# Patient Record
Sex: Male | Born: 1970 | Race: White | Hispanic: No | Marital: Married | Smoking: Current some day smoker
Health system: Southern US, Community
[De-identification: ages and names within clinical notes are randomized; demographics above are authoritative.]

## PROBLEM LIST (undated history)

## (undated) DIAGNOSIS — F329 Major depressive disorder, single episode, unspecified: Secondary | ICD-10-CM

## (undated) DIAGNOSIS — B009 Herpesviral infection, unspecified: Secondary | ICD-10-CM

## (undated) DIAGNOSIS — F32A Depression, unspecified: Secondary | ICD-10-CM

## (undated) DIAGNOSIS — G4726 Circadian rhythm sleep disorder, shift work type: Secondary | ICD-10-CM

## (undated) DIAGNOSIS — N200 Calculus of kidney: Secondary | ICD-10-CM

## (undated) DIAGNOSIS — I10 Essential (primary) hypertension: Secondary | ICD-10-CM

## (undated) HISTORY — DX: Circadian rhythm sleep disorder, shift work type: G47.26

## (undated) HISTORY — DX: Major depressive disorder, single episode, unspecified: F32.9

## (undated) HISTORY — PX: VASECTOMY: SHX75

## (undated) HISTORY — DX: Essential (primary) hypertension: I10

## (undated) HISTORY — DX: Depression, unspecified: F32.A

## (undated) HISTORY — DX: Herpesviral infection, unspecified: B00.9

---

## 2000-02-23 ENCOUNTER — Emergency Department (HOSPITAL_COMMUNITY): Admission: EM | Admit: 2000-02-23 | Discharge: 2000-02-23 | Payer: Self-pay | Admitting: *Deleted

## 2001-07-14 ENCOUNTER — Emergency Department (HOSPITAL_COMMUNITY): Admission: EM | Admit: 2001-07-14 | Discharge: 2001-07-14 | Payer: Self-pay | Admitting: Emergency Medicine

## 2009-11-21 ENCOUNTER — Ambulatory Visit (HOSPITAL_COMMUNITY): Admission: RE | Admit: 2009-11-21 | Discharge: 2009-11-21 | Payer: Self-pay | Admitting: Internal Medicine

## 2012-01-02 ENCOUNTER — Other Ambulatory Visit: Payer: Self-pay | Admitting: Physician Assistant

## 2012-01-04 ENCOUNTER — Other Ambulatory Visit: Payer: Self-pay | Admitting: Physician Assistant

## 2012-01-04 MED ORDER — ZOLPIDEM TARTRATE ER 12.5 MG PO TBCR: 12.5000 mg | EXTENDED_RELEASE_TABLET | Freq: Every evening | ORAL | Status: DC | PRN

## 2012-01-23 ENCOUNTER — Ambulatory Visit (INDEPENDENT_AMBULATORY_CARE_PROVIDER_SITE_OTHER): Payer: 59 | Admitting: Family Medicine

## 2012-01-23 VITALS — BP 143/91 | HR 58 | Temp 98.4°F | Resp 16 | Ht 74.5 in | Wt 249.0 lb

## 2012-01-23 DIAGNOSIS — L723 Sebaceous cyst: Secondary | ICD-10-CM

## 2012-01-23 DIAGNOSIS — Z23 Encounter for immunization: Secondary | ICD-10-CM

## 2012-01-23 NOTE — Progress Notes (Signed)
  Patient Name: Ethan Knox Date of Birth: 1971-06-08 Medical Record Number: [id number] Gender: male Date of Encounter: 01/23/2012  History of Present Illness:  Ethan Knox is a 41 y.o. very pleasant male patient who presents with the following:  He had noted a "lump" on his left lower leg for about 2 years but it was never painful.  However over the last month or so it has gotten larger and then became tender.  It has started to be more tender if something presses on it or if he uses the muscle to ride his bike.  He is generally otherwise healthy.   He is unsure of his last tetanus shot  There is no problem list on file for this patient.  No past medical history on file. No past surgical history on file. History  Substance Use Topics  . Smoking status: Passive Smoker    Types: Cigars  . Smokeless tobacco: Not on file  . Alcohol Use: Not on file   No family history on file. Allergies not on file  Medication list has been reviewed and updated.  Review of Systems: As per HPI- otherwise negative.  Physical Examination: Filed Vitals:   01/23/12 1455  BP: 143/91  Pulse: 58  Temp: 98.4 F (36.9 C)  TempSrc: Oral  Resp: 16  Height: 6' 2.5" (1.892 m)  Weight: 249 lb (112.946 kg)    Body mass index is 31.54 kg/(m^2).   GEN: WDWN, NAD, Non-toxic, Alert & Oriented x 3 HEENT: Atraumatic, Normocephalic.  Ears and Nose: No external deformity. EXTR: No clubbing/cyanosis/edema NEURO: Normal gait.  PSYCH: Normally interactive. Conversant. Not depressed or anxious appearing.  Calm demeanor.  There is a small sore area that seems consistent with a sebaceous cyst on his left posterior/ lateral calf above his ankle  VC obtained.  Area carefully cleansed with betadine and alcohol. anest with about 2.5cc of 1% lidocaine with epi.  I and D into cyst- expressed sebaceous material.  Packed with a small amount of 1/4" packing and dressed  Assessment and Plan: 1. Sebaceous  cyst    2. Need for diphtheria-tetanus-pertussis (Tdap) vaccine, adult/adolescent  Tdap vaccine greater than or equal to 7yo IM   Cyst s/p I and D as above.  Due to small size I do not feel that he definitely has to come back for a packing change- he may be able to just remove the packing himself in 48 hours.  He will use his judgement and come back in if the area seems to be draining or if he notes any other concerns.   Updated tdap today.

## 2012-01-25 ENCOUNTER — Ambulatory Visit (INDEPENDENT_AMBULATORY_CARE_PROVIDER_SITE_OTHER): Payer: 59 | Admitting: Physician Assistant

## 2012-01-25 VITALS — BP 134/74 | HR 68 | Temp 99.5°F | Resp 18 | Ht 74.0 in

## 2012-01-25 DIAGNOSIS — L0291 Cutaneous abscess, unspecified: Secondary | ICD-10-CM

## 2012-01-25 DIAGNOSIS — L0390 Cellulitis, unspecified: Secondary | ICD-10-CM

## 2012-01-25 NOTE — Progress Notes (Signed)
  Subjective:    Patient ID: Ethan Knox, male    DOB: 1971/03/21, 41 y.o.   MRN: [id number]  HPI See previous  Ethan Knox is here for return visit s/p I & D. Has had a lot of drainage and pain.  Some swelling. No fever or chills.   Review of Systems As above     Objective:   Physical Exam Left posterior calf with incision.  Erythema surrounding incision.  Copious sebaceous material and purulent drainage expressed.  Irrigated with NACL and lidocaine.  Repacked.  Area of erythema marked. Dressed.       Assessment & Plan:  Cellulitis Leg  Start doxycycline. Warm compress.  Watch red area. Elevate when possible. Advance packing 1/4" every 48 hrs.  Return Thursday as patient will be out of town,

## 2012-01-26 MED ORDER — DOXYCYCLINE HYCLATE 100 MG PO TABS: 100.0000 mg | ORAL_TABLET | Freq: Two times a day (BID) | ORAL | Status: AC

## 2012-01-30 ENCOUNTER — Ambulatory Visit (INDEPENDENT_AMBULATORY_CARE_PROVIDER_SITE_OTHER): Payer: 59 | Admitting: Family Medicine

## 2012-01-30 VITALS — BP 159/75 | HR 71 | Temp 98.3°F | Resp 16 | Ht 75.25 in | Wt 249.2 lb

## 2012-01-30 DIAGNOSIS — Z5189 Encounter for other specified aftercare: Secondary | ICD-10-CM

## 2012-01-30 NOTE — Progress Notes (Signed)
  Patient Name: Ethan Knox Date of Birth: April 29, 1971 Medical Record Number: [id number] Gender: male Date of Encounter: 01/30/2012  History of Present Illness:  Ethan Knox is a 41 y.o. very pleasant male patient who presents with the following:  Here for wound check for sebaceous cyst on left calf s/p I and D- he started on Doxycycline at his last visit and is feeling better.  Area is less tender and he is able to be more active again.   There is no problem list on file for this patient.  No past medical history on file. No past surgical history on file. History  Substance Use Topics  . Smoking status: Passive Smoker    Types: Cigars  . Smokeless tobacco: Not on file  . Alcohol Use: Not on file   No family history on file. Allergies  Allergen Reactions  . Prilosec (Omeprazole Magnesium) Rash    Medication list has been reviewed and updated.  Review of Systems: As per HPI- otherwise negative.   Physical Examination: Filed Vitals:   01/30/12 1540  BP: 159/75  Pulse: 71  Temp: 98.3 F (36.8 C)  TempSrc: Oral  Resp: 16  Height: 6' 3.25" (1.911 m)  Weight: 249 lb 3.2 oz (113.036 kg)    Body mass index is 30.94 kg/(m^2).   GEN: WDWN, NAD, Non-toxic, Alert & Oriented x 3 HEENT: Atraumatic, Normocephalic.  Ears and Nose: No external deformity. EXTR: No clubbing/cyanosis/edema NEURO: Normal gait.  PSYCH: Normally interactive. Conversant. Not depressed or anxious appearing.  Calm demeanor.  Left calf- small incision.  Removed packing- trace discharge.  Replaced a small piece of 1/4 packing and dressed  Assessment and Plan: Wound care- doing very well.  He can plan to remove the packing himself in 1 to 2 days and keep it covered until healed.  Should not need to return unless other problems or more drainage.

## 2012-04-10 ENCOUNTER — Telehealth: Payer: Self-pay

## 2012-04-10 NOTE — Telephone Encounter (Signed)
Patient needs several refills: rOPINIRole (REQUIP) 0.25 MG tablet valACYclovir (VALTREX) 1000 MG tablet zolpidem (AMBIEN CR) 12.5 MG CR tablet

## 2012-04-12 MED ORDER — VALACYCLOVIR HCL 1 G PO TABS: 1.0000 g | ORAL_TABLET | Freq: Two times a day (BID) | ORAL | Status: DC

## 2012-04-12 MED ORDER — ROPINIROLE HCL 0.25 MG PO TABS: 1.0000 mg | ORAL_TABLET | Freq: Three times a day (TID) | ORAL | Status: DC

## 2012-04-12 MED ORDER — ZOLPIDEM TARTRATE ER 12.5 MG PO TBCR: 12.5000 mg | EXTENDED_RELEASE_TABLET | Freq: Every evening | ORAL | Status: DC | PRN

## 2012-04-12 NOTE — Telephone Encounter (Signed)
Done

## 2012-04-12 NOTE — Telephone Encounter (Signed)
PT NOTIFIED  

## 2012-04-13 ENCOUNTER — Telehealth: Payer: Self-pay

## 2012-04-13 MED ORDER — VALACYCLOVIR HCL 1 G PO TABS: 1000.0000 mg | ORAL_TABLET | Freq: Every day | ORAL | Status: DC

## 2012-04-13 MED ORDER — ROPINIROLE HCL 1 MG PO TABS: 1.0000 mg | ORAL_TABLET | Freq: Every day | ORAL | Status: DC

## 2012-04-13 NOTE — Telephone Encounter (Signed)
Pharmacy called and stated that two of the RFs we sent on 6/2 are incorrect. The Valtrex should be for 100 mg QD, and the Requip should be for 1 mg (total) QD. Both had been sent in for more times per day than needed. Pharmacy and pt both verified correct sig. Marylene Land approved change and sending in new/correct RFs to [facility]

## 2012-06-06 ENCOUNTER — Other Ambulatory Visit: Payer: Self-pay | Admitting: *Deleted

## 2012-06-06 MED ORDER — ZOLPIDEM TARTRATE ER 12.5 MG PO TBCR: 12.5000 mg | EXTENDED_RELEASE_TABLET | Freq: Every evening | ORAL | Status: DC | PRN

## 2012-09-02 ENCOUNTER — Ambulatory Visit (INDEPENDENT_AMBULATORY_CARE_PROVIDER_SITE_OTHER): Payer: 59 | Admitting: Physician Assistant

## 2012-09-02 ENCOUNTER — Encounter: Payer: Self-pay | Admitting: Physician Assistant

## 2012-09-02 VITALS — BP 120/76 | HR 56 | Temp 98.3°F | Resp 16 | Ht 75.0 in | Wt 241.2 lb

## 2012-09-02 DIAGNOSIS — G4726 Circadian rhythm sleep disorder, shift work type: Secondary | ICD-10-CM

## 2012-09-02 DIAGNOSIS — B009 Herpesviral infection, unspecified: Secondary | ICD-10-CM | POA: Insufficient documentation

## 2012-09-02 MED ORDER — ZOLPIDEM TARTRATE ER 12.5 MG PO TBCR: 12.5000 mg | EXTENDED_RELEASE_TABLET | Freq: Every evening | ORAL | Status: DC | PRN

## 2012-09-02 MED ORDER — VALACYCLOVIR HCL 1 G PO TABS: 1000.0000 mg | ORAL_TABLET | Freq: Every day | ORAL | Status: AC

## 2012-09-02 NOTE — Progress Notes (Signed)
  Subjective:    Patient ID: Ethan Knox, male    DOB: 23-Jan-1971, 41 y.o.   MRN: [id number]  HPI This 41 y.o. male presents for Medication refills.  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  Both of these regimens are working well. He notes that he would like to have his cholesterol checked, and that he hasn't had a physical since the [employer]. He is not fasting today, so we elect not to check today.  Review of Systems No chest pain, SOB, HA, dizziness, vision change, N/V, diarrhea, constipation, dysuria, urinary urgency or frequency, myalgias, arthralgias or rash.   Past Medical History  Diagnosis Date  . HSV-2 (herpes simplex virus 2) infection   . Sleep disorder, shift work     Past Surgical History  Procedure Date  . Vasectomy     Prior to Admission medications   Medication Sig Start Date End Date Taking? Authorizing Provider  valACYclovir (VALTREX) 1000 MG tablet Take 1 tablet (1,000 mg total) by mouth daily. 04/13/12 04/13/13 Yes Marzella Schlein McClung, PA-C  zolpidem (AMBIEN CR) 12.5 MG CR tablet Take 1 tablet (12.5 mg total) by mouth at bedtime as needed for sleep. 06/06/12 07/06/12  Nelva Nay, PA-C    Allergies  Allergen Reactions  . Prilosec (Omeprazole Magnesium) Rash    History   Social History  . Marital Status: Married    Spouse Name: Clinton Gallant    Number of Children: 1 son  . Years of Education: 14   Occupational History  . [employer]   Social History Main Topics  . Smoking status: Passive Smoke Exposure - Never Smoker    Types: Cigars   Other Topics Concern  . Not on file   Social History Narrative  . Lives with wife.  6 year old son lives in [place].    Family History  Problem Relation Age of Onset  . Diabetes Father   . Arthritis Father     gout  . Mental retardation Sister        Objective:   Physical Exam VS noted.  A&O x 3.  [place]/AT. Conjunctiva are clear.   Normal respiratory effort.  Skin is warm and dry.  Good mood, appropriate affect, appropriate behavior.     Assessment & Plan:   1. HSV-2 (herpes simplex virus 2) infection  valACYclovir (VALTREX) 1000 MG tablet  2. Sleep disorder, shift work  zolpidem (AMBIEN CR) 12.5 MG CR tablet   RTC 6 months, sooner if needed.  OK to RF Ambien CR through 02/2013.  CPE and fasting labs then or once he turns 42 years.

## 2012-10-06 ENCOUNTER — Telehealth: Payer: Self-pay | Admitting: Physician Assistant

## 2012-10-06 DIAGNOSIS — G4726 Circadian rhythm sleep disorder, shift work type: Secondary | ICD-10-CM

## 2012-10-06 NOTE — Telephone Encounter (Signed)
Pharmacy requesting RF on zolpidem 12.5mg , last disp 09/02/12

## 2012-10-07 MED ORDER — ZOLPIDEM TARTRATE ER 12.5 MG PO TBCR: 12.5000 mg | EXTENDED_RELEASE_TABLET | Freq: Every evening | ORAL | Status: DC | PRN

## 2012-10-07 NOTE — Telephone Encounter (Signed)
Rx printed

## 2012-10-07 NOTE — Telephone Encounter (Signed)
Faxed Rx to pharmacy  

## 2012-11-13 ENCOUNTER — Telehealth: Payer: Self-pay | Admitting: *Deleted

## 2012-11-13 ENCOUNTER — Other Ambulatory Visit: Payer: Self-pay | Admitting: *Deleted

## 2012-11-13 DIAGNOSIS — G4726 Circadian rhythm sleep disorder, shift work type: Secondary | ICD-10-CM

## 2012-11-13 NOTE — Telephone Encounter (Signed)
Pharmacy requesting refill on

## 2013-01-22 ENCOUNTER — Telehealth: Payer: Self-pay

## 2013-01-22 DIAGNOSIS — G4726 Circadian rhythm sleep disorder, shift work type: Secondary | ICD-10-CM

## 2013-01-22 MED ORDER — ZOLPIDEM TARTRATE ER 12.5 MG PO TBCR: 12.5000 mg | EXTENDED_RELEASE_TABLET | Freq: Every evening | ORAL | Status: DC | PRN

## 2013-01-22 NOTE — Telephone Encounter (Signed)
Refill Request on zolpidem (AMBIEN CR) 12.5 MG CR tablet Pharmacy Sent several requests   [phone]

## 2013-01-22 NOTE — Telephone Encounter (Signed)
Pt notified and rx sent to pharmacy 

## 2013-01-22 NOTE — Telephone Encounter (Signed)
rx sent. Meds ordered this encounter  Medications  . zolpidem (AMBIEN CR) 12.5 MG CR tablet    Sig: Take 1 tablet (12.5 mg total) by mouth at bedtime as needed for sleep.    Dispense:  30 tablet    Refill:  0    Order Specific Question:  Supervising Provider    Answer:  DOOLITTLE, ROBERT P [[id number]]

## 2013-01-22 NOTE — Telephone Encounter (Signed)
Please advise on refill request. Pended Rx

## 2013-06-24 ENCOUNTER — Ambulatory Visit (INDEPENDENT_AMBULATORY_CARE_PROVIDER_SITE_OTHER): Payer: 59 | Admitting: Internal Medicine

## 2013-06-24 VITALS — BP 120/80 | HR 55 | Temp 98.7°F | Resp 18 | Ht 73.75 in | Wt 220.6 lb

## 2013-06-24 DIAGNOSIS — G4700 Insomnia, unspecified: Secondary | ICD-10-CM

## 2013-06-24 DIAGNOSIS — H9190 Unspecified hearing loss, unspecified ear: Secondary | ICD-10-CM

## 2013-06-24 DIAGNOSIS — Z0000 Encounter for general adult medical examination without abnormal findings: Secondary | ICD-10-CM

## 2013-06-24 LAB — POCT CBC
Granulocyte percent: 49.2 %G (ref 37–80)
HCT, POC: 48.3 % (ref 43.5–53.7)
MCV: 99.8 fL — AB (ref 80–97)
POC Granulocyte: 2.9 (ref 2–6.9)
RBC: 4.84 M/uL (ref 4.69–6.13)
RDW, POC: 12.6 %

## 2013-06-24 LAB — IFOBT (OCCULT BLOOD): IFOBT: NEGATIVE

## 2013-06-24 LAB — POCT URINALYSIS DIPSTICK
Bilirubin, UA: NEGATIVE
Ketones, UA: NEGATIVE
Leukocytes, UA: NEGATIVE
Nitrite, UA: NEGATIVE
Protein, UA: NEGATIVE
pH, UA: 5

## 2013-06-24 LAB — POCT UA - MICROSCOPIC ONLY
Casts, Ur, LPF, POC: NEGATIVE
Mucus, UA: NEGATIVE
Yeast, UA: NEGATIVE

## 2013-06-24 MED ORDER — ZOLPIDEM TARTRATE 10 MG PO TABS: 10.0000 mg | ORAL_TABLET | Freq: Every evening | ORAL | Status: DC | PRN

## 2013-06-24 NOTE — Patient Instructions (Addendum)
Insomnia Insomnia is frequent trouble falling and/or staying asleep. Insomnia can be a long term problem or a short term problem. Both are common. Insomnia can be a short term problem when the wakefulness is related to a certain stress or worry. Long term insomnia is often related to ongoing stress during waking hours and/or poor sleeping habits. Overtime, sleep deprivation itself can make the problem worse. Every little thing feels more severe because you are overtired and your ability to cope is decreased. CAUSES   Stress, anxiety, and depression.  Poor sleeping habits.  Distractions such as TV in the bedroom.  Naps close to bedtime.  Engaging in emotionally charged conversations before bed.  Technical reading before sleep.  Alcohol and other sedatives. They may make the problem worse. They can hurt normal sleep patterns and normal dream activity.  Stimulants such as caffeine for several hours prior to bedtime.  Pain syndromes and shortness of breath can cause insomnia.  Exercise late at night.  Changing time zones may cause sleeping problems (jet lag). It is sometimes helpful to have someone observe your sleeping patterns. They should look for periods of not breathing during the night (sleep apnea). They should also look to see how long those periods last. If you live alone or observers are uncertain, you can also be observed at a sleep clinic where your sleep patterns will be professionally monitored. Sleep apnea requires a checkup and treatment. Give your caregivers your medical history. Give your caregivers observations your family has made about your sleep.  SYMPTOMS   Not feeling rested in the morning.  Anxiety and restlessness at bedtime.  Difficulty falling and staying asleep. TREATMENT   Your caregiver may prescribe treatment for an underlying medical disorders. Your caregiver can give advice or help if you are using alcohol or other drugs for self-medication. Treatment  of underlying problems will usually eliminate insomnia problems.  Medications can be prescribed for short time use. They are generally not recommended for lengthy use.  Over-the-counter sleep medicines are not recommended for lengthy use. They can be habit forming.  You can promote easier sleeping by making lifestyle changes such as:  Using relaxation techniques that help with breathing and reduce muscle tension.  Exercising earlier in the day.  Changing your diet and the time of your last meal. No night time snacks.  Establish a regular time to go to bed.  Counseling can help with stressful problems and worry.  Soothing music and white noise may be helpful if there are background noises you cannot remove.  Stop tedious detailed work at least one hour before bedtime. HOME CARE INSTRUCTIONS   Keep a diary. Inform your caregiver about your progress. This includes any medication side effects. See your caregiver regularly. Take note of:  Times when you are asleep.  Times when you are awake during the night.  The quality of your sleep.  How you feel the next day. This information will help your caregiver care for you.  Get out of bed if you are still awake after 15 minutes. Read or do some quiet activity. Keep the lights down. Wait until you feel sleepy and go back to bed.  Keep regular sleeping and waking hours. Avoid naps.  Exercise regularly.  Avoid distractions at bedtime. Distractions include watching television or engaging in any intense or detailed activity like attempting to balance the household checkbook.  Develop a bedtime ritual. Keep a familiar routine of bathing, brushing your teeth, climbing into bed at the same   time each night, listening to soothing music. Routines increase the success of falling to sleep faster.  Use relaxation techniques. This can be using breathing and muscle tension release routines. It can also include visualizing peaceful scenes. You can  also help control troubling or intruding thoughts by keeping your mind occupied with boring or repetitive thoughts like the old concept of counting sheep. You can make it more creative like imagining planting one beautiful flower after another in your backyard garden.  During your day, work to eliminate stress. When this is not possible use some of the previous suggestions to help reduce the anxiety that accompanies stressful situations. MAKE SURE YOU:   Understand these instructions.  Will watch your condition.  Will get help right away if you are not doing well or get worse. Document Released: 10/25/2000 Document Revised: 01/20/2012 Document Reviewed: 11/25/2007 [facility] Patient Information 2014 [place], [place]. DASH Diet The DASH diet stands for "Dietary Approaches to Stop Hypertension." It is a healthy eating plan that has been shown to reduce high blood pressure (hypertension) in as little as 14 days, while also possibly providing other significant health benefits. These other health benefits include reducing the risk of breast cancer after menopause and reducing the risk of type 2 diabetes, heart disease, colon cancer, and stroke. Health benefits also include weight loss and slowing kidney failure in patients with chronic kidney disease.  DIET GUIDELINES  Limit salt (sodium). Your diet should contain less than 1500 mg of sodium daily.  Limit refined or processed carbohydrates. Your diet should include mostly whole grains. Desserts and added sugars should be used sparingly.  Include small amounts of heart-healthy fats. These types of fats include nuts, oils, and tub margarine. Limit saturated and trans fats. These fats have been shown to be harmful in the body. CHOOSING FOODS  The following food groups are based on a 2000 calorie diet. See your Registered Dietitian for individual calorie needs. Grains and Grain Products (6 to 8 servings daily)  Eat More Often: Whole-wheat bread, brown  rice, whole-grain or wheat pasta, quinoa, popcorn without added fat or salt (air popped).  Eat Less Often: White bread, white pasta, white rice, cornbread. Vegetables (4 to 5 servings daily)  Eat More Often: Fresh, frozen, and canned vegetables. Vegetables may be raw, steamed, roasted, or grilled with a minimal amount of fat.  Eat Less Often/Avoid: Creamed or fried vegetables. Vegetables in a cheese sauce. Fruit (4 to 5 servings daily)  Eat More Often: All fresh, canned (in natural juice), or frozen fruits. Dried fruits without added sugar. One hundred percent fruit juice ( cup [237 mL] daily).  Eat Less Often: Dried fruits with added sugar. Canned fruit in light or heavy syrup. [employer], Fish, and Poultry (2 servings or less daily. One serving is 3 to 4 oz [85-114 g]).  Eat More Often: Ninety percent or leaner ground beef, tenderloin, sirloin. Round cuts of beef, chicken breast, [place] breast. All fish. Grill, bake, or broil your meat. Nothing should be fried.  Eat Less Often/Avoid: Fatty cuts of meat, [place], or chicken leg, thigh, or wing. Fried cuts of meat or fish. Dairy (2 to 3 servings)  Eat More Often: Low-fat or fat-free milk, low-fat plain or light yogurt, reduced-fat or part-skim cheese.  Eat Less Often/Avoid: Milk (whole, 2%).Whole milk yogurt. Full-fat cheeses. Nuts, Seeds, and Legumes (4 to 5 servings per week)  Eat More Often: All without added salt.  Eat Less Often/Avoid: Salted nuts and seeds, canned beans with  added salt. Fats and Sweets (limited)  Eat More Often: Vegetable oils, tub margarines without trans fats, sugar-free gelatin. Mayonnaise and salad dressings.  Eat Less Often/Avoid: Coconut oils, palm oils, butter, stick margarine, cream, half and half, cookies, candy, pie. FOR MORE INFORMATION The Dash Diet Eating Plan: www.dashdiet.org Document Released: 10/17/2011 Document Revised: 01/20/2012 Document Reviewed: 10/17/2011 [facility] Patient Information  2014 [place], [place]. Attention Deficit Hyperactivity Disorder Attention deficit hyperactivity disorder (ADHD) is a problem with behavior issues based on the way the brain functions (neurobehavioral disorder). It is a common reason for behavior and academic problems in school. CAUSES  The cause of ADHD is unknown in most cases. It may run in families. It sometimes can be associated with learning disabilities and other behavioral problems. SYMPTOMS  There are 3 types of ADHD. The 3 types and some of the symptoms include:  Inattentive  Gets bored or distracted easily.  Loses or forgets things. Forgets to hand in homework.  Has trouble organizing or completing tasks.  Difficulty staying on task.  An inability to organize daily tasks and school work.  Leaving projects, chores, or homework unfinished.  Trouble paying attention or responding to details. Careless mistakes.  Difficulty following directions. Often seems like is not listening.  Dislikes activities that require sustained attention (like chores or homework).  Hyperactive-impulsive  Feels like it is impossible to sit still or stay in a seat. Fidgeting with hands and feet.  Trouble waiting turn.  Talking too much or out of turn. Interruptive.  Speaks or acts impulsively.  Aggressive, disruptive behavior.  Constantly busy or on the go, noisy.  Combined  Has symptoms of both of the above. Often children with ADHD feel discouraged about themselves and with school. They often perform well below their abilities in school. These symptoms can cause problems in home, school, and in relationships with peers. As children get older, the excess motor activities can calm down, but the problems with paying attention and staying organized persist. Most children do not outgrow ADHD but with good treatment can learn to cope with the symptoms. DIAGNOSIS  When ADHD is suspected, the diagnosis should be made by professionals trained in  ADHD.  Diagnosis will include:  Ruling out other reasons for the child's behavior.  The caregivers will check with the child's school and check their medical records.  They will talk to teachers and parents.  Behavior rating scales for the child will be filled out by those dealing with the child on a daily basis. A diagnosis is made only after all information has been considered. TREATMENT  Treatment usually includes behavioral treatment often along with medicines. It may include stimulant medicines. The stimulant medicines decrease impulsivity and hyperactivity and increase attention. Other medicines used include antidepressants and certain blood pressure medicines. Most experts agree that treatment for ADHD should address all aspects of the child's functioning. Treatment should not be limited to the use of medicines alone. Treatment should include structured classroom management. The parents must receive education to address rewarding good behavior, discipline, and limit-setting. Tutoring or behavioral therapy or both should be available for the child. If untreated, the disorder can have long-term serious effects into adolescence and adulthood. HOME CARE INSTRUCTIONS   Often with ADHD there is a lot of frustration among the family in dealing with the illness. There is often blame and anger that is not warranted. This is a life long illness. There is no way to prevent ADHD. In many cases, because the problem affects the  family as a whole, the entire family may need help. A therapist can help the family find better ways to handle the disruptive behaviors and promote change. If the child is young, most of the therapist's work is with the parents. Parents will learn techniques for coping with and improving their child's behavior. Sometimes only the child with the ADHD needs counseling. Your caregivers can help you make these decisions.  Children with ADHD may need help in organizing. Some helpful  tips include:  Keep routines the same every day from wake-up time to bedtime. Schedule everything. This includes homework and playtime. This should include outdoor and indoor recreation. Keep the schedule on the refrigerator or a bulletin board where it is frequently seen. Mark schedule changes as far in advance as possible.  Have a place for everything and keep everything in its place. This includes clothing, backpacks, and school supplies.  Encourage writing down assignments and bringing home needed books.  Offer your child a well-balanced diet. Breakfast is especially important for school performance. Children should avoid drinks with caffeine including:  Soft drinks.  Coffee.  Tea.  However, some older children (adolescents) may find these drinks helpful in improving their attention.  Children with ADHD need consistent rules that they can understand and follow. If rules are followed, give small rewards. Children with ADHD often receive, and expect, criticism. Look for good behavior and praise it. Set realistic goals. Give clear instructions. Look for activities that can foster success and self-esteem. Make time for pleasant activities with your child. Give lots of affection.  Parents are their children's greatest advocates. Learn as much as possible about ADHD. This helps you become a stronger and better advocate for your child. It also helps you educate your child's teachers and instructors if they feel inadequate in these areas. Parent support groups are often helpful. A national group with local chapters is called CHADD (Children and Adults with Attention Deficit Hyperactivity Disorder). PROGNOSIS  There is no cure for ADHD. Children with the disorder seldom outgrow it. Many find adaptive ways to accommodate the ADHD as they mature. SEEK MEDICAL CARE IF:  Your child has repeated muscle twitches, cough or speech outbursts.  Your child has sleep problems.  Your child has a marked  loss of appetite.  Your child develops depression.  Your child has new or worsening behavioral problems.  Your child develops dizziness.  Your child has a racing heart.  Your child has stomach pains.  Your child develops headaches. Document Released: 10/18/2002 Document Revised: 01/20/2012 Document Reviewed: 05/30/2008 [facility] Patient Information 2014 [place], [place].

## 2013-06-24 NOTE — Progress Notes (Signed)
  Subjective:    Patient ID: Ethan Knox, male    DOB: 1971-10-23, 42 y.o.   MRN: [id number]  HPI 42 y.o. Male presents to clinic today for complete physical . Patient has been noticing difficulty with following along with conversations and memory. Was advised to have referral to audiologist by his psychologist. Notices himself being unable to think or things when he knows what they are. Having trouble tracking conversations. Was taking zolpidem at one time to help with sleep.  Is currently seeing a marriage Veterinary surgeon.   Family history of diabetes and emphysema. Patient unaware of any history of cancer in the family.   Review of Systems  Constitutional: Negative.   HENT: Negative.   Eyes: Negative.   Respiratory: Negative.   Cardiovascular: Negative.   Gastrointestinal: Negative.   Endocrine: Negative.   Genitourinary: Negative.   Musculoskeletal: Negative.   Allergic/Immunologic: Negative.   Neurological: Positive for speech difficulty.  Hematological: Negative.   Psychiatric/Behavioral: Positive for sleep disturbance and decreased concentration.       Objective:   Physical Exam  Constitutional: He is oriented to person, place, and time. He appears well-developed and well-nourished.  HENT:  Right Ear: External ear normal.  Left Ear: External ear normal.  Nose: Nose normal.  Mouth/Throat: Oropharynx is clear and moist.  Eyes: Conjunctivae and EOM are normal. Pupils are equal, round, and reactive to light.  Neck: Normal range of motion. Neck supple. No tracheal deviation present. No thyromegaly present.  Cardiovascular: Normal rate, regular rhythm, normal heart sounds and intact distal pulses.   Pulmonary/Chest: Effort normal and breath sounds normal.  Abdominal: Soft. He exhibits no mass. There is no tenderness.  Genitourinary: Rectum normal, prostate normal and penis normal.  Musculoskeletal: Normal range of motion.  Lymphadenopathy:    He has no cervical  adenopathy.  Neurological: He is alert and oriented to person, place, and time. He has normal reflexes. No cranial nerve deficit. He exhibits normal muscle tone. Coordination normal.  Skin: Skin is warm and dry. No rash noted.  Psychiatric: He has a normal mood and affect. His behavior is normal. Judgment and thought content normal. His speech is delayed. Cognition and memory are normal. Cognition and memory are not impaired. He exhibits normal recent memory and normal remote memory.   EKG normal       Assessment & Plan:  Speech/discrimination issues/refer audiology Needs ADD eval Insomnia

## 2013-06-24 NOTE — Progress Notes (Signed)
  Subjective:    Patient ID: Ethan Knox, male    DOB: 1971-03-07, 42 y.o.   MRN: [id number]  HPI In counseling with marriage counselor, she rec audiology eval for hearing/discrimination He alsohas problems blocking when dealing with social interaction. He works shifts and has sleep issues.   Review of Systems     Objective:   Physical Exam        Assessment & Plan:

## 2013-06-25 ENCOUNTER — Encounter: Payer: Self-pay | Admitting: Family Medicine

## 2013-06-25 LAB — TSH: TSH: 1.809 u[IU]/mL (ref 0.350–4.500)

## 2013-06-25 LAB — COMPREHENSIVE METABOLIC PANEL
ALT: 30 U/L (ref 0–53)
AST: 19 U/L (ref 0–37)
Albumin: 4.7 g/dL (ref 3.5–5.2)
CO2: 24 mEq/L (ref 19–32)
Calcium: 9.7 mg/dL (ref 8.4–10.5)
Chloride: 101 mEq/L (ref 96–112)
Creat: 0.86 mg/dL (ref 0.50–1.35)
Potassium: 4.1 mEq/L (ref 3.5–5.3)

## 2013-06-25 LAB — PSA: PSA: 0.94 ng/mL (ref <=4.00)

## 2013-06-25 LAB — LIPID PANEL: Cholesterol: 196 mg/dL (ref 0–200)

## 2013-09-11 ENCOUNTER — Other Ambulatory Visit: Payer: Self-pay | Admitting: Physician Assistant

## 2013-09-16 ENCOUNTER — Other Ambulatory Visit: Payer: Self-pay

## 2013-11-16 ENCOUNTER — Ambulatory Visit (INDEPENDENT_AMBULATORY_CARE_PROVIDER_SITE_OTHER): Payer: 59 | Admitting: Family Medicine

## 2013-11-16 VITALS — BP 136/78 | HR 66 | Temp 98.5°F | Resp 18 | Wt 226.0 lb

## 2013-11-16 DIAGNOSIS — G4700 Insomnia, unspecified: Secondary | ICD-10-CM

## 2013-11-16 DIAGNOSIS — G4726 Circadian rhythm sleep disorder, shift work type: Secondary | ICD-10-CM

## 2013-11-16 DIAGNOSIS — B009 Herpesviral infection, unspecified: Secondary | ICD-10-CM

## 2013-11-16 DIAGNOSIS — B088 Other specified viral infections characterized by skin and mucous membrane lesions: Secondary | ICD-10-CM

## 2013-11-16 MED ORDER — VALACYCLOVIR HCL 1 G PO TABS: ORAL_TABLET | ORAL | Status: DC

## 2013-11-16 MED ORDER — ZOLPIDEM TARTRATE 10 MG PO TABS: 10.0000 mg | ORAL_TABLET | Freq: Every evening | ORAL | Status: DC | PRN

## 2013-11-16 NOTE — Progress Notes (Signed)
This chart was scribed for Laurey Arrow. Brigitte Pulse, MD by Einar Pheasant, ED Scribe. This patient was seen in room 5 and the patient's care was started at 11:12 AM. Subjective:    Patient ID: Ethan Knox, Ethan Knox    DOB: Sep 17, 1971, 43 y.o.   MRN: [id number] Chief Complaint  Patient presents with  . Medication Refill    valtrex, ambien    HPI HPI Comments: Ethan Knox is a 43 y.o. Ethan Knox who presents to [facility] requesting a refill on Valtrex and Ambien. Pt reports using Ambien a half tab twice a week on an as needed basis due to working variable shifts. He has tried Melatonin at times. 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 Otherwise, pt is generally a healthy person.   Past Medical History  Diagnosis Date  . HSV-2 (herpes simplex virus 2) infection   . Sleep disorder, shift work    Allergies  Allergen Reactions  . Prilosec [Omeprazole Magnesium] Rash   Current Outpatient Prescriptions on File Prior to Visit  Medication Sig Dispense Refill  . valACYclovir (VALTREX) 1000 MG tablet Take one tablet by mouth one time daily  30 tablet  9  . zolpidem (AMBIEN) 10 MG tablet Take 1 tablet (10 mg total) by mouth at bedtime as needed for sleep.  30 tablet  3   No current facility-administered medications on file prior to visit.    Review of Systems  Constitutional: Negative for fever, chills and fatigue.  Respiratory: Negative for cough and shortness of breath.   Genitourinary: Negative for hematuria, penile swelling, scrotal swelling, genital sores, penile pain and testicular pain.  Skin: Negative for rash.  Hematological: Negative for adenopathy. Does not bruise/bleed easily.  Psychiatric/Behavioral: Positive for sleep disturbance. Negative for dysphoric mood. The patient is not nervous/anxious.        BP 136/78  Pulse 66  Temp(Src) 98.5 F (36.9 C) (Oral)  Resp 18  Wt 226 lb (102.513 kg)  SpO2 97% Objective:     Physical Exam  Nursing note and vitals reviewed. Constitutional: He appears well-developed and well-nourished. No distress.  HENT:  Head: Normocephalic and atraumatic.  Eyes: Conjunctivae are normal. Right eye exhibits no discharge. Left eye exhibits no discharge.  Neck: Trachea normal. Neck supple. No mass and no thyromegaly present.  Cardiovascular: Normal rate, regular rhythm and normal heart sounds.  Exam reveals no gallop and no friction rub.   No murmur heard. Pulmonary/Chest: Effort normal and breath sounds normal. No respiratory distress. He has no wheezes. He has no rales.  Musculoskeletal: He exhibits no edema and no tenderness.  Lymphadenopathy:    He has no cervical adenopathy.       Right: No supraclavicular adenopathy present.       Left: No supraclavicular adenopathy present.  Neurological: He is alert.  Skin: Skin is warm and dry.  Psychiatric: He has a normal mood and affect. His behavior is normal. Thought content normal.      Assessment & Plan:   Insomnia - Plan: zolpidem (AMBIEN) 10 MG tablet  HSV-2 (herpes simplex virus 2) infection  Sleep disorder, shift work  Meds ordered this encounter  Medications  . zolpidem (AMBIEN) 10 MG tablet    Sig: Take 1 tablet (10 mg total) by mouth at bedtime as needed for sleep.    Dispense:  30 tablet    Refill:  3  . valACYclovir (VALTREX) 1000 MG tablet  Sig: Take one tablet by mouth one time daily    Dispense:  90 tablet    Refill:  4    I personally performed the services described in this documentation, which was scribed in my presence. The recorded information has been reviewed and considered, and addended by me as needed.  Delman Cheadle, MD MPH

## 2014-12-16 ENCOUNTER — Other Ambulatory Visit: Payer: Self-pay | Admitting: Family Medicine

## 2015-01-17 ENCOUNTER — Ambulatory Visit (INDEPENDENT_AMBULATORY_CARE_PROVIDER_SITE_OTHER): Payer: 59 | Admitting: Family Medicine

## 2015-01-17 VITALS — BP 120/88 | HR 57 | Temp 97.6°F | Resp 16 | Ht 74.5 in | Wt 235.4 lb

## 2015-01-17 DIAGNOSIS — Z0000 Encounter for general adult medical examination without abnormal findings: Secondary | ICD-10-CM

## 2015-01-17 DIAGNOSIS — B009 Herpesviral infection, unspecified: Secondary | ICD-10-CM

## 2015-01-17 DIAGNOSIS — Z136 Encounter for screening for cardiovascular disorders: Secondary | ICD-10-CM | POA: Diagnosis not present

## 2015-01-17 DIAGNOSIS — Z130 Encounter for screening for diseases of the blood and blood-forming organs and certain disorders involving the immune mechanism: Secondary | ICD-10-CM | POA: Diagnosis not present

## 2015-01-17 DIAGNOSIS — G4700 Insomnia, unspecified: Secondary | ICD-10-CM | POA: Diagnosis not present

## 2015-01-17 DIAGNOSIS — G4726 Circadian rhythm sleep disorder, shift work type: Secondary | ICD-10-CM

## 2015-01-17 DIAGNOSIS — Z1329 Encounter for screening for other suspected endocrine disorder: Secondary | ICD-10-CM

## 2015-01-17 DIAGNOSIS — Z1383 Encounter for screening for respiratory disorder NEC: Secondary | ICD-10-CM

## 2015-01-17 DIAGNOSIS — Z1389 Encounter for screening for other disorder: Secondary | ICD-10-CM

## 2015-01-17 LAB — LIPID PANEL
Cholesterol: 192 mg/dL (ref 0–200)
HDL: 43 mg/dL (ref >=40)
LDL Cholesterol: 109 mg/dL — ABNORMAL HIGH (ref 0–99)
Total CHOL/HDL Ratio: 4.5 Ratio
Triglycerides: 198 mg/dL — ABNORMAL HIGH (ref <150)
VLDL: 40 mg/dL (ref 0–40)

## 2015-01-17 LAB — CBC
HCT: 44.5 % (ref 39.0–52.0)
Hemoglobin: 16 g/dL (ref 13.0–17.0)
MCH: 33.8 pg (ref 26.0–34.0)
MCHC: 36 g/dL (ref 30.0–36.0)
MCV: 93.9 fL (ref 78.0–100.0)
MPV: 9.6 fL (ref 8.6–12.4)
PLATELETS: 278 10*3/uL (ref 150–400)
RBC: 4.74 MIL/uL (ref 4.22–5.81)
RDW: 12.2 % (ref 11.5–15.5)
WBC: 6 10*3/uL (ref 4.0–10.5)

## 2015-01-17 LAB — COMPREHENSIVE METABOLIC PANEL
ALT: 31 U/L (ref 0–53)
AST: 20 U/L (ref 0–37)
Albumin: 4.5 g/dL (ref 3.5–5.2)
Alkaline Phosphatase: 54 U/L (ref 39–117)
BUN: 15 mg/dL (ref 6–23)
CO2: 25 mEq/L (ref 19–32)
CREATININE: 0.92 mg/dL (ref 0.50–1.35)
Calcium: 9.4 mg/dL (ref 8.4–10.5)
Chloride: 104 mEq/L (ref 96–112)
Glucose, Bld: 102 mg/dL — ABNORMAL HIGH (ref 70–99)
POTASSIUM: 4.3 meq/L (ref 3.5–5.3)
Sodium: 138 mEq/L (ref 135–145)
Total Bilirubin: 0.6 mg/dL (ref 0.2–1.2)
Total Protein: 7.2 g/dL (ref 6.0–8.3)

## 2015-01-17 LAB — TSH: TSH: 1.733 u[IU]/mL (ref 0.350–4.500)

## 2015-01-17 MED ORDER — ZOLPIDEM TARTRATE 10 MG PO TABS: 10.0000 mg | ORAL_TABLET | Freq: Every evening | ORAL | Status: DC | PRN

## 2015-01-17 MED ORDER — VALACYCLOVIR HCL 1 G PO TABS: ORAL_TABLET | ORAL | Status: DC

## 2015-01-17 NOTE — Patient Instructions (Signed)
Keeping you healthy  Get these tests  Blood pressure- Have your blood pressure checked once a year by your healthcare provider.  Normal blood pressure is 120/80.  Weight- Have your body mass index (BMI) calculated to screen for obesity.  BMI is a measure of body fat based on height and weight. You can also calculate your own BMI at [employer]velB[employer].it.  Cholesterol- Have your cholesterol checked regularly starting at age 42, sooner may be necessary if you have diabetes, high blood pressure, if a family member developed heart diseases at an early age or if you smoke.   Chlamydia, HIV, and other sexual transmitted disease- Get screened each year until the age of 45 then within three months of each new sexual partner.  Diabetes- Have your blood sugar checked regularly if you have high blood pressure, high cholesterol, a family history of diabetes or if you are overweight.  Get these vaccines  Flu shot- Every fall.  Tetanus shot- Every 10 years.  Menactra- Single dose; prevents meningitis.  Take these steps  Don't smoke- If you do smoke, ask your healthcare provider about quitting. For tips on how to quit, go to www.smokefree.gov or call 1-800-QUIT-NOW.  Be physically active- Exercise 5 days a week for at least 30 minutes.  If you are not already physically active start slow and gradually work up to 30 minutes of moderate physical activity.  Examples of moderate activity include walking briskly, mowing the yard, dancing, swimming bicycling, etc.  Eat a healthy diet- Eat a variety of healthy foods such as fruits, vegetables, low fat milk, low fat cheese, yogurt, lean meats, poultry, fish, beans, tofu, etc.  For more information on healthy eating, go to www.thenutritionsource.org  Drink alcohol in moderation- Limit alcohol intake two drinks or less a day.  Never drink and drive.  Dentist- Brush and floss teeth twice daily; visit your dentis twice a year.  Depression-Your emotional  health is as important as your physical health.  If you're feeling down, losing interest in things you normally enjoy please talk with your healthcare provider.  Gun Safety- If you keep a gun in your home, keep it unloaded and with the safety lock on.  Bullets should be stored separately.  Helmet use- Always wear a helmet when riding a motorcycle, bicycle, rollerblading or skateboarding.  Safe sex- If you may be exposed to a sexually transmitted infection, use a condom  Seat belts- Seat bels can save your life; always wear one.  Smoke/Carbon Monoxide detectors- These detectors need to be installed on the appropriate level of your home.  Replace batteries at least once a year.  Skin Cancer- When out in the sun, cover up and use sunscreen SPF 15 or higher.  Violence- If anyone is threatening or hurting you, please tell your healthcare provider.  Health Maintenance A healthy lifestyle and preventative care can promote health and wellness.  Maintain regular health, dental, and eye exams.  Eat a healthy diet. Foods like vegetables, fruits, whole grains, low-fat dairy products, and lean protein foods contain the nutrients you need and are low in calories. Decrease your intake of foods high in solid fats, added sugars, and salt. Get information about a proper diet from your health care provider, if necessary.  Regular physical exercise is one of the most important things you can do for your health. Most adults should get at least 150 minutes of moderate-intensity exercise (any activity that increases your heart rate and causes you to sweat) each week. In addition,  most adults need muscle-strengthening exercises on 2 or more days a week.   Maintain a healthy weight. The body mass index (BMI) is a screening tool to identify possible weight problems. It provides an estimate of body fat based on height and weight. Your health care provider can find your BMI and can help you achieve or maintain a  healthy weight. For males 20 years and older:  A BMI below 18.5 is considered underweight.  A BMI of 18.5 to 24.9 is normal.  A BMI of 25 to 29.9 is considered overweight.  A BMI of 30 and above is considered obese.  Maintain normal blood lipids and cholesterol by exercising and minimizing your intake of saturated fat. Eat a balanced diet with plenty of fruits and vegetables. Blood tests for lipids and cholesterol should begin at age 91 and be repeated every 5 years. If your lipid or cholesterol levels are high, you are over age 75, or you are at high risk for heart disease, you may need your cholesterol levels checked more frequently.Ongoing high lipid and cholesterol levels should be treated with medicines if diet and exercise are not working.  If you smoke, find out from your health care provider how to quit. If you do not use tobacco, do not start.  Lung cancer screening is recommended for adults aged 62-80 years who are at high risk for developing lung cancer because of a history of smoking. A yearly low-dose CT scan of the lungs is recommended for people who have at least a 30-pack-year history of smoking and are current smokers or have quit within the past 15 years. A pack year of smoking is smoking an average of 1 pack of cigarettes a day for 1 year (for example, a 30-pack-year history of smoking could mean smoking 1 pack a day for 30 years or 2 packs a day for 15 years). Yearly screening should continue until the smoker has stopped smoking for at least 15 years. Yearly screening should be stopped for people who develop a health problem that would prevent them from having lung cancer treatment.  If you choose to drink alcohol, do not have more than 2 drinks per day. One drink is considered to be 12 oz (360 mL) of beer, 5 oz (150 mL) of wine, or 1.5 oz (45 mL) of liquor.  Avoid the use of street drugs. Do not share needles with anyone. Ask for help if you need support or instructions about  stopping the use of drugs.  High blood pressure causes heart disease and increases the risk of stroke. Blood pressure should be checked at least every 1-2 years. Ongoing high blood pressure should be treated with medicines if weight loss and exercise are not effective.  If you are 15-76 years old, ask your health care provider if you should take aspirin to prevent heart disease.  Diabetes screening involves taking a blood sample to check your fasting blood sugar level. This should be done once every 3 years after age 81 if you are at a normal weight and without risk factors for diabetes. Testing should be considered at a younger age or be carried out more frequently if you are overweight and have at least 1 risk factor for diabetes.  Colorectal cancer can be detected and often prevented. Most routine colorectal cancer screening begins at the age of 27 and continues through age 62. However, your health care provider may recommend screening at an earlier age if you have risk factors for colon  cancer. On a yearly basis, your health care provider may provide home test kits to check for hidden blood in the stool. A small camera at the end of a tube may be used to directly examine the colon (sigmoidoscopy or colonoscopy) to detect the earliest forms of colorectal cancer. Talk to your health care provider about this at age 79 when routine screening begins. A direct exam of the colon should be repeated every 5-10 years through age 63, unless early forms of precancerous polyps or small growths are found.  People who are at an increased risk for hepatitis B should be screened for this virus. You are considered at high risk for hepatitis B if:  You were born in a country where hepatitis B occurs often. Talk with your health care provider about which countries are considered high risk.  Your parents were born in a high-risk country and you have not received a shot to protect against hepatitis B (hepatitis B  vaccine).  You have HIV or AIDS.  You use needles to inject street drugs.  You live with, or have sex with, someone who has hepatitis B.  You are a man who has sex with other men (MSM).  You get hemodialysis treatment.  You take certain medicines for conditions like cancer, organ transplantation, and autoimmune conditions.  Hepatitis C blood testing is recommended for all people born from [birth year] through 1963 and any individual with known risk factors for hepatitis C.  Healthy men should no longer receive prostate-specific antigen (PSA) blood tests as part of routine cancer screening. Talk to your health care provider about prostate cancer screening.  Testicular cancer screening is not recommended for adolescents or adult males who have no symptoms. Screening includes self-exam, a health care provider exam, and other screening tests. Consult with your health care provider about any symptoms you have or any concerns you have about testicular cancer.  Practice safe sex. Use condoms and avoid high-risk sexual practices to reduce the spread of sexually transmitted infections (STIs).  You should be screened for STIs, including gonorrhea and chlamydia if:  You are sexually active and are younger than 24 years.  You are older than 24 years, and your health care provider tells you that you are at risk for this type of infection.  Your sexual activity has changed since you were last screened, and you are at an increased risk for chlamydia or gonorrhea. Ask your health care provider if you are at risk.  If you are at risk of being infected with HIV, it is recommended that you take a prescription medicine daily to prevent HIV infection. This is called pre-exposure prophylaxis (PrEP). You are considered at risk if:  You are a man who has sex with other men (MSM).  You are a heterosexual man who is sexually active with multiple partners.  You take drugs by injection.  You are sexually active  with a partner who has HIV.  Talk with your health care provider about whether you are at high risk of being infected with HIV. If you choose to begin PrEP, you should first be tested for HIV. You should then be tested every 3 months for as long as you are taking PrEP.  Use sunscreen. Apply sunscreen liberally and repeatedly throughout the day. You should seek shade when your shadow is shorter than you. Protect yourself by wearing long sleeves, pants, a wide-brimmed hat, and sunglasses year round whenever you are outdoors.  Tell your health care provider of  new moles or changes in moles, especially if there is a change in shape or color. Also, tell your health care provider if a mole is larger than the size of a pencil eraser.  A one-time screening for abdominal aortic aneurysm (AAA) and surgical repair of large AAAs by ultrasound is recommended for men aged 84-75 years who are current or former smokers.  Stay current with your vaccines (immunizations). Document Released: 04/25/2008 Document Revised: 11/02/2013 Document Reviewed: 03/25/2011 [facility] Patient Information 2015 [place], [place]. This information is not intended to replace advice given to you by your health care provider. Make sure you discuss any questions you have with your health care provider.

## 2015-01-17 NOTE — Progress Notes (Signed)
This chart was scribed for Delman Cheadle, MD by Einar Pheasant, ED Scribe. This patient was seen in room 2 and the patient's care was started at 9:04 AM.  Subjective:    Patient ID: Ethan Knox, male    DOB: 07/08/1971, 44 y.o.   MRN: [id number] Chief Complaint  Patient presents with  . CPE  . Medication Refill    Valtrex and Ambien    HPI Ethan Knox is a 44 y.o. male with PMhx of HSV-2 infection and sleep disorder secondary to his work schedule. Last  Tetanus vaccine was in  2013 and last labs were in 2014. Pt also had his flu vaccine in 2014.  Today, pt states that he is doing well. However, he still has difficulty getting a full night sleep secondary to his shift at work. He states that the Ambien helps him get some rest when needed. Pt denies any outbreaks or rashes in the past year. [name] is fasting today so will repeat blood work.   He is also requesting medication refill on his prescribed Valtrex and Ambien.   Pt denies any family issues, any trouble breathing, any family h/o MI's at the age of 48, or any urinary symptoms. No tobacco or alcohol use endorsed by the pt.   Patient Active Problem List   Diagnosis Date Noted  . HSV-2 (herpes simplex virus 2) infection   . Sleep disorder, shift work    Past Medical History  Diagnosis Date  . HSV-2 (herpes simplex virus 2) infection   . Sleep disorder, shift work    Past Surgical History  Procedure Laterality Date  . Vasectomy     Allergies  Allergen Reactions  . Prilosec [Omeprazole Magnesium] Rash   Prior to Admission medications   Medication Sig Start Date End Date Taking? Authorizing Provider  valACYclovir (VALTREX) 1000 MG tablet TAKE ONE TABLET BY MOUTH ONE TIME DAILY.  "OV NEEDED FOR ADDITIONAL REFILLS" 12/16/14  Yes Chelle S Jeffery, PA-C  zolpidem (AMBIEN) 10 MG tablet Take 1 tablet (10 mg total) by mouth at bedtime as needed for sleep. 11/16/13 04/10/14  Shawnee Knapp, MD   History   Social History    . Marital Status: Married    Spouse Name: [name]  . Number of Children: 1  . Years of Education: 14   Occupational History  . Engineer, structural Unemployed   Social History Main Topics  . Smoking status: Former Smoker    Types: Cigars  . Smokeless tobacco: Not on file     Comment: hx occasional cigars  . Alcohol Use: Yes  . Drug Use: No  . Sexual Activity: Yes   Other Topics Concern  . Not on file   Social History Narrative   Lives with his wife.  His 70 year old son lives in [place].     Review of Systems  Constitutional: Negative for fever and chills.  HENT: Negative for rhinorrhea and sore throat.   Eyes: Negative for visual disturbance.  Respiratory: Negative for cough and shortness of breath.   Cardiovascular: Negative for chest pain and leg swelling.  Gastrointestinal: Negative for nausea, vomiting, abdominal pain and diarrhea.  Genitourinary: Negative for dysuria.  Musculoskeletal: Negative for back pain and neck pain.  Skin: Negative for rash.      BP 120/88 mmHg  Pulse 57  Temp(Src) 97.6 F (36.4 C) (Oral)  Resp 16  Ht 6' 2.5" (1.892 m)  Wt 235 lb 6.4 oz (106.777 kg)  BMI 29.83 kg/m2  SpO2 99%  Objective:   Physical Exam  Constitutional: He appears well-developed and well-nourished. No distress.  HENT:  Head: Normocephalic and atraumatic.  Eyes: Conjunctivae are normal. Right eye exhibits no discharge. Left eye exhibits no discharge.  Neck: Normal range of motion. Neck supple. No thyromegaly present.  Cardiovascular: Normal rate, regular rhythm, S1 normal, S2 normal and normal heart sounds.  Exam reveals no gallop and no friction rub.   No murmur heard. Pulmonary/Chest: Effort normal and breath sounds normal. No respiratory distress. He has no decreased breath sounds. He has no wheezes. He has no rales.  Abdominal: Soft. He exhibits no distension. There is no tenderness.  Musculoskeletal: He exhibits no edema or tenderness.  Lymphadenopathy:     He has no cervical adenopathy.  Neurological: He is alert.  Reflex Scores:      Patellar reflexes are 2+ on the right side and 2+ on the left side. Skin: Skin is warm and dry.  Psychiatric: He has a normal mood and affect. His behavior is normal. Thought content normal.  Nursing note and vitals reviewed.  Results for orders placed or performed in visit on 01/17/15  Lipid panel  Result Value Ref Range   Cholesterol  0 - 200 mg/dL   Triglycerides  <150 mg/dL   HDL  >=40 mg/dL   Total CHOL/HDL Ratio  Ratio   VLDL  0 - 40 mg/dL   LDL Cholesterol  0 - 99 mg/dL  CBC  Result Value Ref Range   WBC 6.0 4.0 - 10.5 K/uL   RBC 4.74 4.22 - 5.81 MIL/uL   Hemoglobin 16.0 13.0 - 17.0 g/dL   HCT 44.5 39.0 - 52.0 %   MCV 93.9 78.0 - 100.0 fL   MCH 33.8 26.0 - 34.0 pg   MCHC 36.0 30.0 - 36.0 g/dL   RDW 12.2 11.5 - 15.5 %   Platelets 278 150 - 400 K/uL   MPV 9.6 8.6 - 12.4 fL  Comprehensive metabolic panel  Result Value Ref Range   Sodium  135 - 145 mEq/L   Potassium  3.5 - 5.3 mEq/L   Chloride  96 - 112 mEq/L   CO2  19 - 32 mEq/L   Glucose, Bld  70 - 99 mg/dL   BUN  6 - 23 mg/dL   Creat  0.50 - 1.35 mg/dL   Total Bilirubin  0.2 - 1.2 mg/dL   Alkaline Phosphatase  39 - 117 U/L   AST  0 - 37 U/L   ALT  0 - 53 U/L   Total Protein  6.0 - 8.3 g/dL   Albumin  3.5 - 5.2 g/dL   Calcium  8.4 - 10.5 mg/dL  TSH  Result Value Ref Range   TSH  0.350 - 4.500 uIU/mL    Assessment & Plan:   Routine health maintenance - Plan: Lipid panel, CBC, Comprehensive metabolic panel, TSH  Sleep disorder, shift work  HSV-2 (herpes simplex virus 2) infection  Screening for cardiovascular, respiratory, and genitourinary diseases  Screening for thyroid disorder - Plan: TSH  Screening for deficiency anemia - Plan: CBC  Insomnia - Plan: zolpidem (AMBIEN) 10 MG tablet  Meds ordered this encounter  Medications  . zolpidem (AMBIEN) 10 MG tablet    Sig: Take 1 tablet (10 mg total) by mouth at bedtime as  needed for sleep.    Dispense:  30 tablet    Refill:  3  . valACYclovir (VALTREX) 1000 MG tablet  Sig: TAKE ONE TABLET BY MOUTH ONE TIME DAILY.    Dispense:  90 tablet    Refill:  4    I personally performed the services described in this documentation, which was scribed in my presence. The recorded information has been reviewed and considered, and addended by me as needed.  Delman Cheadle, MD MPH        General measures for eczema - In clinical experience, avoidance of irritants or exacerbating factors is beneficial for most patients with dyshidrotic eczema. General skin care measures aimed at reducing skin irritation and restoring the skin barrier include:  Using lukewarm water and soap-free cleansers to wash hands  Drying hands thoroughly after washing  Applying emollients (eg, petroleum jelly) immediately after hand drying and as often as possible  Wearing cotton gloves under vinyl or other nonlatex gloves when performing wet work  Removing rings and watches and bracelets before wet work  Wearing protective gloves in cold weather  Wearing task-specific gloves for frictional exposures (eg, gardening, carpentry)  Avoiding exposure to irritants (eg, detergents, solvents, hair lotions or dyes, acidic foods [eg, citrus fruit])  In clinical practice, astringent solutions such as aluminum subacetate (Burow's solution) or witch hazel are used for wet, weeping skin. Hands or feet are soaked in the solution for 15 minutes two to four times per day. nd

## 2015-08-04 ENCOUNTER — Encounter: Payer: Self-pay | Admitting: *Deleted

## 2016-02-20 ENCOUNTER — Ambulatory Visit (INDEPENDENT_AMBULATORY_CARE_PROVIDER_SITE_OTHER): Payer: 59 | Admitting: Family Medicine

## 2016-02-20 VITALS — BP 144/84 | HR 65 | Temp 98.0°F | Resp 16 | Ht 74.5 in | Wt 232.8 lb

## 2016-02-20 DIAGNOSIS — B009 Herpesviral infection, unspecified: Secondary | ICD-10-CM | POA: Diagnosis not present

## 2016-02-20 DIAGNOSIS — G4700 Insomnia, unspecified: Secondary | ICD-10-CM

## 2016-02-20 DIAGNOSIS — F329 Major depressive disorder, single episode, unspecified: Secondary | ICD-10-CM

## 2016-02-20 DIAGNOSIS — F32A Depression, unspecified: Secondary | ICD-10-CM

## 2016-02-20 MED ORDER — ZOLPIDEM TARTRATE 10 MG PO TABS: 10.0000 mg | ORAL_TABLET | Freq: Every evening | ORAL | Status: DC | PRN

## 2016-02-20 MED ORDER — DULOXETINE HCL 60 MG PO CPEP: 60.0000 mg | ORAL_CAPSULE | Freq: Every day | ORAL | Status: DC

## 2016-02-20 MED ORDER — VALACYCLOVIR HCL 1 G PO TABS: ORAL_TABLET | ORAL | Status: DC

## 2016-02-20 NOTE — Addendum Note (Signed)
Addended by: [name] on: 02/20/2016 10:42 AM   Modules accepted: Orders

## 2016-02-20 NOTE — Progress Notes (Signed)
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  Doing well on duloxetine.   He has some sweating and reduced libido.  He had tried exercise before.  Overall, depression is much better.  He had tried Wellbutrin in the past.  Sleep is okay, but he has to rotate shifts so sleep can be difficult.  Marriage is going better.  Some erectile dysfunction.  No headaches.  Objective:  BP 144/84 mmHg  Pulse 65  Temp(Src) 98 F (36.7 C) (Oral)  Resp 16  Ht 6' 2.5" (1.892 m)  Wt 232 lb 12.8 oz (105.597 kg)  BMI 29.50 kg/m2  SpO2 99% Lungs clear Heart regular no murmur  Insomnia - Plan: zolpidem (AMBIEN) 10 MG tablet  Routine cultures positive for HSV2 - Plan: valACYclovir (VALTREX) 1000 MG tablet  Depression - Plan: DULoxetine (CYMBALTA) 60 MG capsule  [name], MD

## 2016-02-20 NOTE — Patient Instructions (Addendum)
I have refilled your medicines.

## 2016-07-08 ENCOUNTER — Other Ambulatory Visit: Payer: Self-pay

## 2016-07-08 DIAGNOSIS — F329 Major depressive disorder, single episode, unspecified: Secondary | ICD-10-CM

## 2016-07-08 DIAGNOSIS — F32A Depression, unspecified: Secondary | ICD-10-CM

## 2016-07-08 MED ORDER — DULOXETINE HCL 60 MG PO CPEP: 60.0000 mg | ORAL_CAPSULE | Freq: Every day | ORAL | 0 refills | Status: DC

## 2016-11-15 ENCOUNTER — Other Ambulatory Visit: Payer: Self-pay | Admitting: Emergency Medicine

## 2016-11-15 DIAGNOSIS — F32A Depression, unspecified: Secondary | ICD-10-CM

## 2016-11-15 DIAGNOSIS — F329 Major depressive disorder, single episode, unspecified: Secondary | ICD-10-CM

## 2017-01-13 ENCOUNTER — Ambulatory Visit (INDEPENDENT_AMBULATORY_CARE_PROVIDER_SITE_OTHER): Payer: 59 | Admitting: Urgent Care

## 2017-01-13 VITALS — BP 126/72 | HR 71 | Temp 97.6°F | Resp 18 | Ht 74.5 in | Wt 235.2 lb

## 2017-01-13 DIAGNOSIS — G4700 Insomnia, unspecified: Secondary | ICD-10-CM

## 2017-01-13 DIAGNOSIS — F32A Depression, unspecified: Secondary | ICD-10-CM

## 2017-01-13 DIAGNOSIS — Z8619 Personal history of other infectious and parasitic diseases: Secondary | ICD-10-CM

## 2017-01-13 DIAGNOSIS — F329 Major depressive disorder, single episode, unspecified: Secondary | ICD-10-CM

## 2017-01-13 DIAGNOSIS — J301 Allergic rhinitis due to pollen: Secondary | ICD-10-CM | POA: Diagnosis not present

## 2017-01-13 MED ORDER — VALACYCLOVIR HCL 500 MG PO TABS: 500.0000 mg | ORAL_TABLET | Freq: Two times a day (BID) | ORAL | 3 refills | Status: DC

## 2017-01-13 MED ORDER — FLUTICASONE PROPIONATE 50 MCG/ACT NA SUSP: 2.0000 | Freq: Every day | NASAL | 6 refills | Status: DC

## 2017-01-13 MED ORDER — ZOLPIDEM TARTRATE 10 MG PO TABS: 5.0000 mg | ORAL_TABLET | Freq: Every evening | ORAL | 1 refills | Status: DC | PRN

## 2017-01-13 MED ORDER — DULOXETINE HCL 30 MG PO CPEP: 30.0000 mg | ORAL_CAPSULE | Freq: Every day | ORAL | 1 refills | Status: DC

## 2017-01-13 NOTE — Progress Notes (Signed)
Subjective:  By signing my name below, I, Ethan Knox, attest that this documentation has been prepared under the direction and in the presence of [name], MD Electronically Signed: Ladene Artist, ED Scribe 01/13/2017 at 1:58 PM.   Patient ID: Ethan Knox, male    DOB: November 11, 1971, 46 y.o.   MRN: [id number]  Chief Complaint  Patient presents with  . Medication Refill    cymbalta, ambien   HPI Ethan Knox is a 46 y.o. male who presents to Primary Care at [facility] for a medication refill.   Insomnia Treated with Ambien 10 mg in the past. Does have rotating shifts at work every 2 weeks with the [employer] where he has been for 13 years. Some shift related sleep disorder noted in the past. Pt states that he splits 10 mg tablet and takes it only 1-2 times/week. He has also tried Melatonin which helps him get to sleep but not stay asleep. Pt snores sometimes but denies h/o apnea or daytime sleepiness. He denies side effects of sleep walking.   Depression Pt takes Cymbalta for depression which he states has helped his depression and anger but he reports sexual dysfunction. Pt has also noticed some weight gain since starting Cymbalta due to a lack of drive.   HSV-2 Pt stopped Valtrex approximately 2 months ago. He was taking 1000 mg daily for maintenance but states that he has not had an outbreak in years. He denies side effects from Valtrex.   Environmental Allergies Pt reports a h/o environmental allergies which has been flared up over the past few days. Pt reports symptoms of HA and sinus pressure. He states that Flonase typically improves his symptoms.     Patient Active Problem List   Diagnosis Date Noted  . HSV-2 (herpes simplex virus 2) infection   . Sleep disorder, shift work    Past Medical History:  Diagnosis Date  . Depression   . HSV-2 (herpes simplex virus 2) infection   . Hypertension   . Sleep disorder, shift work    Past Surgical History:    Procedure Laterality Date  . VASECTOMY     Allergies  Allergen Reactions  . Prilosec [Omeprazole Magnesium] Rash   Prior to Admission medications   Medication Sig Start Date End Date Taking? Authorizing Provider  DULoxetine (CYMBALTA) 60 MG capsule TAKE 1 CAPSULE (60 MG TOTAL) BY MOUTH DAILY. 11/15/16  Yes Darlyne Russian, MD  valACYclovir (VALTREX) 1000 MG tablet TAKE ONE TABLET BY MOUTH ONE TIME DAILY. Patient not taking: Reported on 01/13/2017 02/20/16   [name], MD  zolpidem (AMBIEN) 10 MG tablet Take 1 tablet (10 mg total) by mouth at bedtime as needed for sleep. 02/20/16 07/14/16  [name], MD   Social History   Social History  . Marital status: Married    Spouse name: [name]  . Number of children: 1  . Years of education: 39   Occupational History  . Engineer, structural Unemployed   Social History Main Topics  . Smoking status: Former Smoker    Types: Cigars  . Smokeless tobacco: Never Used     Comment: hx occasional cigars  . Alcohol use Yes  . Drug use: No  . Sexual activity: Yes   Other Topics Concern  . Not on file   Social History Narrative   Lives with his wife.  His 4 year old son lives in [place].   Review of Systems  HENT: Positive for sinus pressure.  Allergic/Immunologic: Positive for environmental allergies.  Neurological: Positive for headaches.  Psychiatric/Behavioral: Positive for sleep disturbance. Negative for agitation and dysphoric mood.      Objective:   Physical Exam  Constitutional: He is oriented to person, place, and time. He appears well-developed and well-nourished. No distress.  HENT:  Head: Normocephalic and atraumatic.  Minimal infraorbital edema. Minimal edema of turbinates.   Eyes: Conjunctivae and EOM are normal.  Neck: Neck supple. No tracheal deviation present.  Cardiovascular: Normal rate and regular rhythm.   Pulmonary/Chest: Effort normal and breath sounds normal. No respiratory distress.  Musculoskeletal:  Normal range of motion.  Neurological: He is alert and oriented to person, place, and time.  Skin: Skin is warm and dry.  Psychiatric: He has a normal mood and affect. His behavior is normal.  Nursing note and vitals reviewed.  Vitals:   01/13/17 1326  BP: 126/72  Pulse: 71  Resp: 18  Temp: 97.6 F (36.4 C)  TempSrc: Oral  SpO2: 97%  Weight: 235 lb 3.2 oz (106.7 kg)  Height: 6' 2.5" (1.892 m)      Assessment & Plan:   [name] is a 46 y.o. male Insomnia, unspecified type - Plan: zolpidem (AMBIEN) 10 MG tablet  - Shift work related sleep disorder likely, doubt OSA but if persistent daytime somnolence after routine sleep the night before, or persistent snoring, consider sleep study. Continue Ambien as needed for breakthrough symptoms, and should have sufficient prescription for next 6 months if he is only using 2 times per week.  Depression, unspecified depression type - Plan: DULoxetine (CYMBALTA) 30 MG capsule  - Tolerating Cymbalta, depression reportedly is well controlled, but some sexual side effects. We'll try lower dose of Cymbalta, but if any increased anhedonia or worsened depression symptoms, return to 60 mg dose. Follow-up in 6 months  Allergic rhinitis due to pollen, unspecified chronicity, unspecified seasonality - Plan: fluticasone (FLONASE) 50 MCG/ACT nasal spray  -Start Flonase, over-the-counter antihistamine as needed.  History of herpes genitalis - Plan: valACYclovir (VALTREX) 500 MG tablet  -No recent flares, including off medications recently. Will lower preventative dose to 500 mg daily.   Recheck 6 months for physical.  Meds ordered this encounter  Medications  . zolpidem (AMBIEN) 10 MG tablet    Sig: Take 0.5-1 tablets (5-10 mg total) by mouth at bedtime as needed for sleep.    Dispense:  30 tablet    Refill:  1  . DULoxetine (CYMBALTA) 30 MG capsule    Sig: Take 1 capsule (30 mg total) by mouth daily.    Dispense:  90 capsule    Refill:  1   . valACYclovir (VALTREX) 500 MG tablet    Sig: Take 1 tablet (500 mg total) by mouth 2 (two) times daily.    Dispense:  90 tablet    Refill:  3  . fluticasone (FLONASE) 50 MCG/ACT nasal spray    Sig: Place 2 sprays into both nostrils daily.    Dispense:  16 g    Refill:  6   Patient Instructions    Try lower dose of Valtrex for suppression. If you do have any flares of HSV on that lower dose, can return to 1000 mg per day for suppression.  Okay to continue Ambien as needed for shift work related sleep problems, but if any persistent snoring, frequent awakening, or daytime sleepiness on usual days that have not been after a change of shifts, would recommend discussing those symptoms further in office.  Flonase nasal spray for allergies, over-the-counter Allegra, Claritin or Zyrtec as needed. Based on control of depression symptoms, can try lower dose of Cymbalta to see if you have less sexual side effects. Start exercise in some way most days a week. If you have worsening of drive to exercise, or any worsening depression symptoms on lower dose of medication, return to discuss this further. Follow-up with me in 6 months for physical.  Return to the clinic or go to the nearest emergency room if any of your symptoms worsen or new symptoms occur.   IF you received an x-ray today, you will receive an invoice from [facility]. Please contact [facility] at [phone] with questions or concerns regarding your invoice.   IF you received labwork today, you will receive an invoice from [facility]. Please contact [facility] at [phone] with questions or concerns regarding your invoice.   Our billing staff will not be able to assist you with questions regarding bills from these companies.  You will be contacted with the lab results as soon as they are available. The fastest way to get your results is to activate your My Chart account. Instructions are located on the last page of  this paperwork. If you have not heard from [place] regarding the results in 2 weeks, please contact this office.      I personally performed the services described in this documentation, which was scribed in my presence. The recorded information has been reviewed and considered for accuracy and completeness, addended by me as needed, and agree with information above.  Signed,   Merri Ray, MD Primary Care at [facility].  01/13/17 2:29 PM

## 2017-01-13 NOTE — Patient Instructions (Addendum)
  Try lower dose of Valtrex for suppression. If you do have any flares of HSV on that lower dose, can return to 1000 mg per day for suppression.  Okay to continue Ambien as needed for shift work related sleep problems, but if any persistent snoring, frequent awakening, or daytime sleepiness on usual days that have not been after a change of shifts, would recommend discussing those symptoms further in office.   Flonase nasal spray for allergies, over-the-counter Allegra, Claritin or Zyrtec as needed. Based on control of depression symptoms, can try lower dose of Cymbalta to see if you have less sexual side effects. Start exercise in some way most days a week. If you have worsening of drive to exercise, or any worsening depression symptoms on lower dose of medication, return to discuss this further. Follow-up with me in 6 months for physical.  Return to the clinic or go to the nearest emergency room if any of your symptoms worsen or new symptoms occur.   IF you received an x-ray today, you will receive an invoice from [facility]. Please contact [facility] at [phone] with questions or concerns regarding your invoice.   IF you received labwork today, you will receive an invoice from [place]. Please contact [facility] at [phone] with questions or concerns regarding your invoice.   Our billing staff will not be able to assist you with questions regarding bills from these companies.  You will be contacted with the lab results as soon as they are available. The fastest way to get your results is to activate your My Chart account. Instructions are located on the last page of this paperwork. If you have not heard from [place] regarding the results in 2 weeks, please contact this office.

## 2017-01-30 ENCOUNTER — Encounter: Payer: Self-pay | Admitting: Family Medicine

## 2017-01-30 DIAGNOSIS — F329 Major depressive disorder, single episode, unspecified: Secondary | ICD-10-CM

## 2017-01-30 DIAGNOSIS — F32A Depression, unspecified: Secondary | ICD-10-CM

## 2017-02-03 MED ORDER — DULOXETINE HCL 60 MG PO CPEP: 60.0000 mg | ORAL_CAPSULE | Freq: Every day | ORAL | 1 refills | Status: DC

## 2017-06-18 ENCOUNTER — Ambulatory Visit: Payer: Self-pay

## 2017-06-18 ENCOUNTER — Other Ambulatory Visit: Payer: Self-pay | Admitting: Occupational Medicine

## 2017-06-18 DIAGNOSIS — M25531 Pain in right wrist: Secondary | ICD-10-CM

## 2017-07-17 ENCOUNTER — Encounter: Payer: Self-pay | Admitting: Family Medicine

## 2017-07-17 ENCOUNTER — Ambulatory Visit (INDEPENDENT_AMBULATORY_CARE_PROVIDER_SITE_OTHER): Payer: 59 | Admitting: Family Medicine

## 2017-07-17 VITALS — BP 135/85 | HR 65 | Temp 98.7°F | Resp 16 | Ht 74.0 in | Wt 239.0 lb

## 2017-07-17 DIAGNOSIS — Z13220 Encounter for screening for lipoid disorders: Secondary | ICD-10-CM

## 2017-07-17 DIAGNOSIS — F329 Major depressive disorder, single episode, unspecified: Secondary | ICD-10-CM | POA: Diagnosis not present

## 2017-07-17 DIAGNOSIS — Z23 Encounter for immunization: Secondary | ICD-10-CM | POA: Diagnosis not present

## 2017-07-17 DIAGNOSIS — Z0000 Encounter for general adult medical examination without abnormal findings: Secondary | ICD-10-CM | POA: Diagnosis not present

## 2017-07-17 DIAGNOSIS — F32A Depression, unspecified: Secondary | ICD-10-CM

## 2017-07-17 DIAGNOSIS — Z1329 Encounter for screening for other suspected endocrine disorder: Secondary | ICD-10-CM

## 2017-07-17 DIAGNOSIS — G4700 Insomnia, unspecified: Secondary | ICD-10-CM

## 2017-07-17 DIAGNOSIS — Z131 Encounter for screening for diabetes mellitus: Secondary | ICD-10-CM

## 2017-07-17 MED ORDER — ZOLPIDEM TARTRATE 10 MG PO TABS: 5.0000 mg | ORAL_TABLET | Freq: Every evening | ORAL | 1 refills | Status: DC | PRN

## 2017-07-17 MED ORDER — DULOXETINE HCL 60 MG PO CPEP: 60.0000 mg | ORAL_CAPSULE | Freq: Every day | ORAL | 1 refills | Status: DC

## 2017-07-17 NOTE — Patient Instructions (Addendum)
If depression is stable or improved, can continue same dose of Cymbalta. If changes needed, would recommend follow up with psychiatry. Continue follow up with your therapist.   Madaline Brilliant to continue Ambien for now as needed.  Return in the next 2 weeks if possible for fasting blood work. Fasting for 8 hours prior to that blood work.   Keeping you healthy  Get these tests  Blood pressure- Have your blood pressure checked once a year by your healthcare provider.  Normal blood pressure is 120/80.  Weight- Have your body mass index (BMI) calculated to screen for obesity.  BMI is a measure of body fat based on height and weight. You can also calculate your own BMI at [employer].it.  Cholesterol- Have your cholesterol checked regularly starting at age 87, sooner may be necessary if you have diabetes, high blood pressure, if a family member developed heart diseases at an early age or if you smoke.   Chlamydia, HIV, and other sexual transmitted disease- Get screened each year until the age of 21 then within three months of each new sexual partner.  Diabetes- Have your blood sugar checked regularly if you have high blood pressure, high cholesterol, a family history of diabetes or if you are overweight.  Get these vaccines  Flu shot- Every fall.  Tetanus shot- Every 10 years.  Menactra- Single dose; prevents meningitis.  Take these steps  Don't smoke- If you do smoke, ask your healthcare provider about quitting. For tips on how to quit, go to www.smokefree.gov or call 1-800-QUIT-NOW.  Be physically active- Exercise 5 days a week for at least 30 minutes.  If you are not already physically active start slow and gradually work up to 30 minutes of moderate physical activity.  Examples of moderate activity include walking briskly, mowing the yard, dancing, swimming bicycling, etc.  Eat a healthy diet- Eat a variety of healthy foods such as fruits, vegetables, low fat milk, low fat cheese,  yogurt, lean meats, poultry, fish, beans, tofu, etc.  For more information on healthy eating, go to www.thenutritionsource.org  Drink alcohol in moderation- Limit alcohol intake two drinks or less a day.  Never drink and drive.  Dentist- Brush and floss teeth twice daily; visit your dentis twice a year.  Depression-Your emotional health is as important as your physical health.  If you're feeling down, losing interest in things you normally enjoy please talk with your healthcare provider.  Gun Safety- If you keep a gun in your home, keep it unloaded and with the safety lock on.  Bullets should be stored separately.  Helmet use- Always wear a helmet when riding a motorcycle, bicycle, rollerblading or skateboarding.  Safe sex- If you may be exposed to a sexually transmitted infection, use a condom  Seat belts- Seat bels can save your life; always wear one.  Smoke/Carbon Monoxide detectors- These detectors need to be installed on the appropriate level of your home.  Replace batteries at least once a year.  Skin Cancer- When out in the sun, cover up and use sunscreen SPF 15 or higher.  Violence- If anyone is threatening or hurting you, please tell your healthcare provider.    IF you received an x-ray today, you will receive an invoice from [facility]. Please contact [facility] at [phone] with questions or concerns regarding your invoice.   IF you received labwork today, you will receive an invoice from [place]. Please contact [facility] at [phone] with questions or concerns regarding your invoice.   Our  billing staff will not be able to assist you with questions regarding bills from these companies.  You will be contacted with the lab results as soon as they are available. The fastest way to get your results is to activate your My Chart account. Instructions are located on the last page of this paperwork. If you have not heard from [place] regarding the results in 2  weeks, please contact this office.

## 2017-07-17 NOTE — Progress Notes (Signed)
Subjective:  By signing my name below, I, Moises Blood, attest that this documentation has been prepared under the direction and in the presence of Merri Ray, MD. Electronically Signed: Moises Blood, [place]. 07/17/2017 , 2:25 PM .  Patient was seen in Room 11 .   Patient ID: Ethan Knox, male    DOB: 1971-11-08, 46 y.o.   MRN: [id number] Chief Complaint  Patient presents with  . Annual Exam  . Depression    11 on screening tool side effects with duloxetine  . Medication Refill    ambien   HPI Ethan Knox is a 46 y.o. male  Here for annual physical. His last meal was 10:30AM.   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 He was taking Ambien 2 times per week, when last discussed in March.   He states taking Ambien about 3-4 times a week. He's still doing shift work, unchanged from before. His shifts can be 6:00AM to 5:30PM for 2 weeks, and then shifts to 4:00PM to 3:30AM for 2 weeks. He usually takes Ambien when he's doing the day shifts. He denies any sleep walking, weird behaviors or reaction side effects. He also had to work against the protest in [place] recently.   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 He denies any outbreaks.   Allergic rhinitis He takes Flonase; OTC antihistamines discussed in March. He notes the Flonase worked, but wants to try non-prescription version of it.   Cancer Screening His PSA was normal in 2014.  Family history: none in family.   Immunizations Immunization History  Administered Date(s) Administered  . Influenza Split 08/04/2012, 07/18/2015  . Tdap 01/23/2012   He received flu shot today.   STI testing He denies need for STI testing today.   [facility] 07/17/2017 01/13/2017  Decreased Interest 2 0  Down, Depressed, Hopeless 1 0  PHQ - 2 Score 3 0  Altered  sleeping 3 -  Tired, decreased energy 1 -  Change in appetite 0 -  Feeling bad or failure about yourself  1 -  Trouble concentrating 1 -  Moving slowly or fidgety/restless 2 -  Suicidal thoughts 0 -  PHQ-9 Score 11 -  Difficult doing work/chores Somewhat difficult -   He did have some sexual side effects with Cymbalta 674mQD, so decreased to 3055mD.   Patient states he went crazy with the 74m47ms he felt angry and suicidal thoughts multiple times a day. He denies any specific suicidal plans or intent. When returned to 60mg32m he felt better. He sees counselor, PattyAgustina Caroli meets once a month. He denies SI/HI.   Vision  Visual Acuity Screening   Right eye Left eye Both eyes  Without correction: 20/13 20/13 20/13   With correction:       Dentist He is followed by dentist, sees every 6 months.   Exercise He rides bike for work and outside of work.   Patient Active Problem List   Diagnosis Date Noted  . HSV-2 (herpes simplex virus 2) infection   . Sleep disorder, shift work    Past Medical History:  Diagnosis Date  . Depression   . HSV-2 (herpes simplex virus 2) infection   . Hypertension   . Sleep disorder, shift work    Past Surgical History:  Procedure Laterality Date  . VASECTOMY  Allergies  Allergen Reactions  . Prilosec [Omeprazole Magnesium] Rash   Prior to Admission medications   Medication Sig Start Date End Date Taking? Authorizing Provider  DULoxetine (CYMBALTA) 60 MG capsule Take 1 capsule (60 mg total) by mouth daily. 02/03/17   [name], MD  fluticasone (FLONASE) 50 MCG/ACT nasal spray Place 2 sprays into both nostrils daily. 01/13/17   [name], MD  valACYclovir (VALTREX) 500 MG tablet Take 1 tablet (500 mg total) by mouth 2 (two) times daily. 01/13/17   [name], MD  zolpidem (AMBIEN) 10 MG tablet Take 0.5-1 tablets (5-10 mg total) by mouth at bedtime as needed for sleep. 01/13/17 06/07/17  [name], MD    Social History   Social History  . Marital status: Married    Spouse name: [name]  . Number of children: 1  . Years of education: 35   Occupational History  . Engineer, structural Unemployed   Social History Main Topics  . Smoking status: Former Smoker    Types: Cigars  . Smokeless tobacco: Never Used     Comment: hx occasional cigars  . Alcohol use Yes  . Drug use: No  . Sexual activity: Yes   Other Topics Concern  . Not on file   Social History Narrative   Lives with his wife.  His 20 year old son lives in [place].   Review of Systems 13 point ROS - negative     Objective:   Physical Exam  Constitutional: He is oriented to person, place, and time. He appears well-developed and well-nourished.  HENT:  Head: Normocephalic and atraumatic.  Right Ear: External ear normal.  Left Ear: External ear normal.  Mouth/Throat: Oropharynx is clear and moist.  Eyes: Pupils are equal, round, and reactive to light. Conjunctivae and EOM are normal.  Neck: Normal range of motion. Neck supple. No thyromegaly present.  Cardiovascular: Normal rate, regular rhythm, normal heart sounds and intact distal pulses.   Pulmonary/Chest: Effort normal and breath sounds normal. No respiratory distress. He has no wheezes.  Abdominal: Soft. He exhibits no distension. There is no tenderness.  Musculoskeletal: Normal range of motion. He exhibits no edema or tenderness.  Lymphadenopathy:    He has no cervical adenopathy.  Neurological: He is alert and oriented to person, place, and time. He has normal reflexes.  Skin: Skin is warm and dry.  Psychiatric: He has a normal mood and affect. His behavior is normal.  Vitals reviewed.   Vitals:   07/17/17 1351  BP: 135/85  Pulse: 65  Resp: 16  Temp: 98.7 F (37.1 C)  TempSrc: Oral  SpO2: 96%  Weight: 239 lb (108.4 kg)  Height: 6' 2"  (1.88 m)      Assessment & Plan:   Ethan Knox is a 46 y.o. male Annual physical exam  -  -anticipatory guidance as below in AVS, screening labs above. Health maintenance items as above in HPI discussed/recommended as applicable.   Need for prophylactic vaccination and inoculation against influenza - Plan: Flu Vaccine QUAD 36+ mos IM  Depression, unspecified depression type - Plan: DULoxetine (CYMBALTA) 60 MG capsule, TSH  - Currently reports stable symptoms on Cymbalta 60 mg dose. Denies recent homicidal/suicidal ideation, and has continued counseling. Advised if any worsening of depression symptoms, would recommend evaluation with psychiatrist to determine if additional medication or changes needed.  Insomnia, unspecified type - Plan: zolpidem (AMBIEN) 10 MG tablet  - Shift work sleep disorder to some degree,  along with anxiety/depression symptoms as above. Has tolerated Ambien for some time without parasomnias or other side effects, does not take daily.  - Refilled Ambien No. 30, 1 refill.  Screening for thyroid disorder - Plan: TSH, CANCELED: TSH  Screening for hyperlipidemia - Plan: Lipid panel, CANCELED: Lipid panel  Screening for diabetes mellitus - Plan: Comprehensive metabolic panel, CANCELED: Comprehensive metabolic panel   Meds ordered this encounter  Medications  . DULoxetine (CYMBALTA) 60 MG capsule    Sig: Take 1 capsule (60 mg total) by mouth daily.    Dispense:  90 capsule    Refill:  1  . zolpidem (AMBIEN) 10 MG tablet    Sig: Take 0.5-1 tablets (5-10 mg total) by mouth at bedtime as needed for sleep.    Dispense:  30 tablet    Refill:  1   Patient Instructions   If depression is stable or improved, can continue same dose of Cymbalta. If changes needed, would recommend follow up with psychiatry. Continue follow up with your therapist.   Madaline Brilliant to continue Ambien for now as needed.  Return in the next 2 weeks if possible for fasting blood work. Fasting for 8 hours prior to that blood work.   Keeping you healthy  Get these tests  Blood pressure- Have  your blood pressure checked once a year by your healthcare provider.  Normal blood pressure is 120/80.  Weight- Have your body mass index (BMI) calculated to screen for obesity.  BMI is a measure of body fat based on height and weight. You can also calculate your own BMI at GravelBags.it.  Cholesterol- Have your cholesterol checked regularly starting at age 58, sooner may be necessary if you have diabetes, high blood pressure, if a family member developed heart diseases at an early age or if you smoke.   Chlamydia, HIV, and other sexual transmitted disease- Get screened each year until the age of 2 then within three months of each new sexual partner.  Diabetes- Have your blood sugar checked regularly if you have high blood pressure, high cholesterol, a family history of diabetes or if you are overweight.  Get these vaccines  Flu shot- Every fall.  Tetanus shot- Every 10 years.  Menactra- Single dose; prevents meningitis.  Take these steps  Don't smoke- If you do smoke, ask your healthcare provider about quitting. For tips on how to quit, go to www.smokefree.gov or call 1-800-QUIT-NOW.  Be physically active- Exercise 5 days a week for at least 30 minutes.  If you are not already physically active start slow and gradually work up to 30 minutes of moderate physical activity.  Examples of moderate activity include walking briskly, mowing the yard, dancing, swimming bicycling, etc.  Eat a healthy diet- Eat a variety of healthy foods such as fruits, vegetables, low fat milk, low fat cheese, yogurt, lean meats, poultry, fish, beans, tofu, etc.  For more information on healthy eating, go to www.thenutritionsource.org  Drink alcohol in moderation- Limit alcohol intake two drinks or less a day.  Never drink and drive.  Dentist- Brush and floss teeth twice daily; visit your dentis twice a year.  Depression-Your emotional health is as important as your physical health.  If you're  feeling down, losing interest in things you normally enjoy please talk with your healthcare provider.  Gun Safety- If you keep a gun in your home, keep it unloaded and with the safety lock on.  Bullets should be stored separately.  Helmet use- Always wear a helmet  when riding a motorcycle, bicycle, rollerblading or skateboarding.  Safe sex- If you may be exposed to a sexually transmitted infection, use a condom  Seat belts- Seat bels can save your life; always wear one.  Smoke/Carbon Monoxide detectors- These detectors need to be installed on the appropriate level of your home.  Replace batteries at least once a year.  Skin Cancer- When out in the sun, cover up and use sunscreen SPF 15 or higher.  Violence- If anyone is threatening or hurting you, please tell your healthcare provider.    IF you received an x-ray today, you will receive an invoice from [facility]. Please contact [facility] at [phone] with questions or concerns regarding your invoice.   IF you received labwork today, you will receive an invoice from [facility]. Please contact [facility] at [phone] with questions or concerns regarding your invoice.   Our billing staff will not be able to assist you with questions regarding bills from these companies.  You will be contacted with the lab results as soon as they are available. The fastest way to get your results is to activate your My Chart account. Instructions are located on the last page of this paperwork. If you have not heard from [place] regarding the results in 2 weeks, please contact this office.       I personally performed the services described in this documentation, which was scribed in my presence. The recorded information has been reviewed and considered for accuracy and completeness, addended by me as needed, and agree with information above.  Signed,   Merri Ray, MD [facility].   07/17/17 3:39 PM

## 2017-08-02 ENCOUNTER — Other Ambulatory Visit: Payer: Self-pay | Admitting: Family Medicine

## 2017-08-02 DIAGNOSIS — F32A Depression, unspecified: Secondary | ICD-10-CM

## 2017-08-02 DIAGNOSIS — F329 Major depressive disorder, single episode, unspecified: Secondary | ICD-10-CM

## 2017-08-26 ENCOUNTER — Ambulatory Visit (INDEPENDENT_AMBULATORY_CARE_PROVIDER_SITE_OTHER): Payer: 59 | Admitting: Family Medicine

## 2017-08-26 DIAGNOSIS — Z1329 Encounter for screening for other suspected endocrine disorder: Secondary | ICD-10-CM | POA: Diagnosis not present

## 2017-08-26 DIAGNOSIS — Z13220 Encounter for screening for lipoid disorders: Secondary | ICD-10-CM

## 2017-08-26 DIAGNOSIS — F329 Major depressive disorder, single episode, unspecified: Secondary | ICD-10-CM

## 2017-08-26 DIAGNOSIS — Z131 Encounter for screening for diabetes mellitus: Secondary | ICD-10-CM

## 2017-08-26 DIAGNOSIS — F32A Depression, unspecified: Secondary | ICD-10-CM

## 2017-08-27 LAB — COMPREHENSIVE METABOLIC PANEL
A/G RATIO: 1.6 (ref 1.2–2.2)
ALBUMIN: 4.6 g/dL (ref 3.5–5.5)
ALT: 30 IU/L (ref 0–44)
AST: 23 IU/L (ref 0–40)
Alkaline Phosphatase: 71 IU/L (ref 39–117)
BILIRUBIN TOTAL: 0.6 mg/dL (ref 0.0–1.2)
BUN / CREAT RATIO: 22 — AB (ref 9–20)
BUN: 16 mg/dL (ref 6–24)
CHLORIDE: 104 mmol/L (ref 96–106)
CO2: 21 mmol/L (ref 20–29)
Calcium: 9.7 mg/dL (ref 8.7–10.2)
Creatinine, Ser: 0.72 mg/dL — ABNORMAL LOW (ref 0.76–1.27)
GFR calc non Af Amer: 113 mL/min/{1.73_m2} (ref >59)
GFR, EST AFRICAN AMERICAN: 131 mL/min/{1.73_m2} (ref >59)
Globulin, Total: 2.8 g/dL (ref 1.5–4.5)
Glucose: 107 mg/dL — ABNORMAL HIGH (ref 65–99)
POTASSIUM: 4.5 mmol/L (ref 3.5–5.2)
Sodium: 140 mmol/L (ref 134–144)
TOTAL PROTEIN: 7.4 g/dL (ref 6.0–8.5)

## 2017-08-27 LAB — LIPID PANEL
CHOLESTEROL TOTAL: 200 mg/dL — AB (ref 100–199)
Chol/HDL Ratio: 4.1 ratio (ref 0.0–5.0)
HDL: 49 mg/dL (ref >39)
LDL Calculated: 122 mg/dL — ABNORMAL HIGH (ref 0–99)
Triglycerides: 147 mg/dL (ref 0–149)
VLDL CHOLESTEROL CAL: 29 mg/dL (ref 5–40)

## 2017-08-27 LAB — TSH: TSH: 1.65 u[IU]/mL (ref 0.450–4.500)

## 2018-01-17 ENCOUNTER — Other Ambulatory Visit: Payer: Self-pay | Admitting: Family Medicine

## 2018-01-17 DIAGNOSIS — F32A Depression, unspecified: Secondary | ICD-10-CM

## 2018-01-17 DIAGNOSIS — F329 Major depressive disorder, single episode, unspecified: Secondary | ICD-10-CM

## 2018-01-27 ENCOUNTER — Other Ambulatory Visit: Payer: Self-pay

## 2018-01-27 ENCOUNTER — Ambulatory Visit: Payer: 59 | Admitting: Family Medicine

## 2018-01-27 ENCOUNTER — Encounter: Payer: Self-pay | Admitting: Family Medicine

## 2018-01-27 VITALS — BP 130/80 | HR 58 | Temp 98.0°F | Resp 16 | Ht 75.0 in | Wt 249.8 lb

## 2018-01-27 DIAGNOSIS — F418 Other specified anxiety disorders: Secondary | ICD-10-CM

## 2018-01-27 DIAGNOSIS — F32A Depression, unspecified: Secondary | ICD-10-CM

## 2018-01-27 DIAGNOSIS — B009 Herpesviral infection, unspecified: Secondary | ICD-10-CM | POA: Diagnosis not present

## 2018-01-27 DIAGNOSIS — F329 Major depressive disorder, single episode, unspecified: Secondary | ICD-10-CM

## 2018-01-27 DIAGNOSIS — E785 Hyperlipidemia, unspecified: Secondary | ICD-10-CM

## 2018-01-27 DIAGNOSIS — R739 Hyperglycemia, unspecified: Secondary | ICD-10-CM | POA: Diagnosis not present

## 2018-01-27 DIAGNOSIS — G4700 Insomnia, unspecified: Secondary | ICD-10-CM | POA: Diagnosis not present

## 2018-01-27 MED ORDER — DULOXETINE HCL 60 MG PO CPEP: ORAL_CAPSULE | ORAL | 1 refills | Status: DC

## 2018-01-27 NOTE — Patient Instructions (Addendum)
Exercise most days per week to help manage depression and anger issues. I would not change meds at this point. I would meet with your therapist as well to see if she has other advice. If further anger outbursts, follow up to discuss these symptoms further.   Ok to continue Ambien if needed, but only use as needed. Let me know if you need a refill.   I will check your cholesterol and blood sugar again  - please return for fasting labs at your convenience.   Valtrex once per day to prevent HSV outbreaks.   Follow up in 6 months, but I am happy to see you sooner if needed.    Tips for Managing Your Anger HOW CAN ANGER AFFECT MY LIFE? Everyone feels angry from time to time. It is okay and normal to feel angry. However, the way that you behave or react to anger can make it a problem. If you react too strongly to anger or you cannot control your anger, that can cause relationships problems at home and work. Anger can also affect your health. Uncontrolled anger increases your risk of heart disease. When you are angry, your heart rate and blood pressure rise. Levels of certain energy hormones, such as adrenaline, also increase. When this happens, your heart has to work harder. In extreme cases, anger can cause the blood vessels to become narrow. This reduces the supply of blood and oxygen to the heart, and that can trigger chest pain (angina). Anger can also trigger stress-related problems, such as:  Headaches.  Poor digestion.  Trouble sleeping (insomnia).  WHAT ACTIONS CAN I TAKE TO HELP MANAGE MY ANGER? You can take actions to help you manage your anger. For example:  Express your anger. When you express your anger in a healthy way, it is a form of communication. The following strategies can help you to express your anger in a healthy way and when you are ready to do so: ? Step away. When you are feeling reactive, it may take at least 20 minutes for your body to return to its normal blood  pressure and heart rate. To help your body do this, take a walk, listen to music, stretch, take deep breaths, and avoid the situation or person who is making you angry. Try to only discuss your anger when you feel calm again. ? Try to consider how others feel before you react. Avoid swearing, sighing, raising your voice, or blaming. ? Choose a good time to work through problems. You may be more likely to lose your temper at the end of the day when you are tired. ? Keep an anger journal. Writing down the situations that make you angry can help you figure out what triggers your anger and why.  Consider changing your perception. Is there another way you can view the situation that will leave you with a different emotion? Sometimes, changing the way you think about a situation can make it seem less infuriating. Here are some ways to do that: ? Remind yourself that everyone is not out to get you. ? Remind yourself that a disappointing result is not the end of the world. ? Take steps to solve or prevent the situation that upsets you. ? Find the humor in an aggravating situation. ? Deal with the physical effects by taking deep breaths, exercising, or taking a walk. ? Slowly repeat the word "relax" or another calming phrase. ? Picture a relaxing image in your mind. Close your eyes and use  that image to help you calm yourself.  WHEN SHOULD I SEEK ADDITIONAL HELP? Anger becomes a problem if it occurs frequently and lasts for long periods of time. You may also need help managing your anger if:  You use physical force or aggression when you are angry and others feel threatened and fearful.  You feel that your anger is out of control.  Anger is interfering with your job.  Anger is causing problems with your health.  Anger is causing problems with your relationships.  Anger is affecting your ability to tolerate normal daily situations, such as sitting in a traffic jam or waiting in line.  You treat  others disrespectfully.  You do not trust people around you.  It may help to ask someone you trust whether he or she thinks you show any of these signs. Sometimes, it can be hard to recognize the problem yourself. WHERE CAN I GET SUPPORT? A psychologist or another licensed mental health professional can help you learn how to manage your anger. Ask your health care provider for a referral, or look online to find a psychologist who specializes in anger management. You can search the websites of many mental health organizations to find a mental health care provider. Local Domestic Abuse Projects are also available for help. Your local hospital or behavioral counselors in your area may also offer anger management programs or support groups that can help. WHERE CAN I FIND MORE INFORMATION?  The U.S. Centers for Disease Control and Prevention: RentalRefinancing.at  American Psychological Association: WirelessPromos.com.cy  The Substance Abuse and Mental Health Services Administration: FuneralShow.uy  The [employer] on Domestic Violence: https://anderson-johnson.com/  This information is not intended to replace advice given to you by your health care provider. Make sure you discuss any questions you have with your health care provider. Document Released: 08/25/2007 Document Revised: 06/30/2016 Document Reviewed: 09/01/2015 Elsevier Interactive Patient Education  2018 [employer].   IF you received an x-ray today, you will receive an invoice from [facility]. Please contact [facility] at [phone] with questions or concerns regarding your invoice.   IF you received labwork today, you will receive an invoice from [facility]. Please contact [facility] at [phone] with questions or concerns regarding your invoice.   Our billing staff will not be able to assist you with questions  regarding bills from these companies.  You will be contacted with the lab results as soon as they are available. The fastest way to get your results is to activate your My Chart account. Instructions are located on the last page of this paperwork. If you have not heard from [place] regarding the results in 2 weeks, please contact this office.
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
## 2018-01-30 ENCOUNTER — Encounter: Payer: Self-pay | Admitting: Family Medicine

## 2018-11-13 ENCOUNTER — Other Ambulatory Visit: Payer: Self-pay | Admitting: Occupational Medicine

## 2018-11-13 ENCOUNTER — Ambulatory Visit: Payer: Self-pay

## 2018-11-13 DIAGNOSIS — M25532 Pain in left wrist: Secondary | ICD-10-CM

## 2019-01-01 ENCOUNTER — Other Ambulatory Visit: Payer: Self-pay

## 2019-01-01 ENCOUNTER — Ambulatory Visit: Payer: 59 | Admitting: Family Medicine

## 2019-01-01 ENCOUNTER — Encounter: Payer: Self-pay | Admitting: Family Medicine

## 2019-01-01 VITALS — BP 148/82 | HR 68 | Temp 98.6°F | Ht 75.0 in | Wt 248.8 lb

## 2019-01-01 DIAGNOSIS — Z23 Encounter for immunization: Secondary | ICD-10-CM | POA: Diagnosis not present

## 2019-01-01 DIAGNOSIS — B009 Herpesviral infection, unspecified: Secondary | ICD-10-CM

## 2019-01-01 DIAGNOSIS — F329 Major depressive disorder, single episode, unspecified: Secondary | ICD-10-CM

## 2019-01-01 DIAGNOSIS — F32A Depression, unspecified: Secondary | ICD-10-CM

## 2019-01-01 DIAGNOSIS — R12 Heartburn: Secondary | ICD-10-CM | POA: Diagnosis not present

## 2019-01-01 DIAGNOSIS — R011 Cardiac murmur, unspecified: Secondary | ICD-10-CM

## 2019-01-01 DIAGNOSIS — R030 Elevated blood-pressure reading, without diagnosis of hypertension: Secondary | ICD-10-CM

## 2019-01-01 DIAGNOSIS — G4700 Insomnia, unspecified: Secondary | ICD-10-CM

## 2019-01-01 MED ORDER — VALACYCLOVIR HCL 500 MG PO TABS: 500.0000 mg | ORAL_TABLET | Freq: Every day | ORAL | 3 refills | Status: DC

## 2019-01-01 MED ORDER — DULOXETINE HCL 60 MG PO CPEP: ORAL_CAPSULE | ORAL | 1 refills | Status: DC

## 2019-01-01 MED ORDER — ZOLPIDEM TARTRATE 10 MG PO TABS: 5.0000 mg | ORAL_TABLET | Freq: Every evening | ORAL | 1 refills | Status: DC | PRN

## 2019-01-01 NOTE — Patient Instructions (Addendum)
Blood pressure is still slightly elevated today, I will check some blood work, but follow-up in the next few weeks with some home readings to decide if medications are needed.  No change in other medications for now.  I would recommend meeting with therapist if any worsening depression/anger  symptoms.  Restart valacyclovir 1 pill once per day for prevention, if any flare in symptoms can increase that to twice per day.  I do hear slight heart murmur today I will refer you to cardiology to evaluate that further and decide on possible echocardiogram  For heartburn ok to continue Pepcid.  See other foods to avoid below.  If any worsening symptom return for recheck.  We can discuss it further next visit, and decide if you need to meet with gastroenterology.  Please return if worsening.  Return to the clinic or go to the nearest emergency room if any of your symptoms worsen or new symptoms occur.   Food Choices for Gastroesophageal Reflux Disease, Adult When you have gastroesophageal reflux disease (GERD), the foods you eat and your eating habits are very important. Choosing the right foods can help ease your discomfort. Think about working with a nutrition specialist (dietitian) to help you make good choices. What are tips for following this plan?  Meals  Choose healthy foods that are low in fat, such as fruits, vegetables, whole grains, low-fat dairy products, and lean meat, fish, and poultry.  Eat small meals often instead of 3 large meals a day. Eat your meals slowly, and in a place where you are relaxed. Avoid bending over or lying down until 2-3 hours after eating.  Avoid eating meals 2-3 hours before bed.  Avoid drinking a lot of liquid with meals.  Cook foods using methods other than frying. Bake, grill, or broil food instead.  Avoid or limit: ? Chocolate. ? Peppermint or spearmint. ? Alcohol. ? Pepper. ? Black and decaffeinated coffee. ? Black and decaffeinated tea. ? Bubbly  (carbonated) soft drinks. ? Caffeinated energy drinks and soft drinks.  Limit high-fat foods such as: ? Fatty meat or fried foods. ? Whole milk, cream, butter, or ice cream. ? Nuts and nut butters. ? Pastries, donuts, and sweets made with butter or shortening.  Avoid foods that cause symptoms. These foods may be different for everyone. Common foods that cause symptoms include: ? Tomatoes. ? Oranges, lemons, and limes. ? Peppers. ? Spicy food. ? Onions and garlic. ? Vinegar. Lifestyle  Maintain a healthy weight. Ask your doctor what weight is healthy for you. If you need to lose weight, work with your doctor to do so safely.  Exercise for at least 30 minutes for 5 or more days each week, or as told by your doctor.  Wear loose-fitting clothes.  Do not smoke. If you need help quitting, ask your doctor.  Sleep with the head of your bed higher than your feet. Use a wedge under the mattress or blocks under the bed frame to raise the head of the bed. Summary  When you have gastroesophageal reflux disease (GERD), food and lifestyle choices are very important in easing your symptoms.  Eat small meals often instead of 3 large meals a day. Eat your meals slowly, and in a place where you are relaxed.  Limit high-fat foods such as fatty meat or fried foods.  Avoid bending over or lying down until 2-3 hours after eating.  Avoid peppermint and spearmint, caffeine, alcohol, and chocolate. This information is not intended to replace advice  given to you by your health care provider. Make sure you discuss any questions you have with your health care provider. Document Released: 04/28/2012 Document Revised: 12/03/2016 Document Reviewed: 12/03/2016 Elsevier Interactive Patient Education  [employer].    If you have lab work done today you will be contacted with your lab results within the next 2 weeks.  If you have not heard from [place] then please contact us. The fastest way to get your  results is to register for My Chart.   IF you received an x-ray today, you will receive an invoice from [facility] Radiology. Please contact [facility] Radiology at [phone] with questions or concerns regarding your invoice.   IF you received labwork today, you will receive an invoice from [facility]. Please contact [facility] at [phone] with questions or concerns regarding your invoice.   Our billing staff will not be able to assist you with questions regarding bills from these companies.  You will be contacted with the lab results as soon as they are available. The fastest way to get your results is to activate your My Chart account. Instructions are located on the last page of this paperwork. If you have not heard from [place] regarding the results in 2 weeks, please contact this office.

## 2019-01-01 NOTE — Progress Notes (Signed)
Subjective:    Patient ID: Ethan Knox, male    DOB: 1971-10-10, 48 y.o.   MRN: [id number]  HPI LIMUEL Knox is a 48 y.o. male Presents today for: Chief Complaint  Patient presents with  . Medication Refill    ambien, cymbalta, valtrex and zantac recall question  here for med refills. Last seen 01/2018.  See other details from previous visit.  He did not follow-up for blood work as planned.  Depression with anxiety Anger symptoms in past.  Tougher in winter with less exercise.  cymbalta 1m qd. Tolerating ok. No further flairs/outbursts.  ambien for sleep - out for few months. See prior visits.  Still some shift changes - every 2 weeks. Sometimes new change in shift. No side effects with ambien, no parasomnias. Works better wih infrequent use.  Some exercise.  No recent therapist visit - 9 months ago.  Has talked with wife. Sx's seem to improve on own.  No SI/HI  [facility] 01/01/2019 01/27/2018 07/17/2017 01/13/2017  Decreased Interest 0 0 2 0  Down, Depressed, Hopeless 0 0 1 0  PHQ - 2 Score 0 0 3 0  Altered sleeping - - 3 -  Tired, decreased energy - - 1 -  Change in appetite - - 0 -  Feeling bad or failure about yourself  - - 1 -  Trouble concentrating - - 1 -  Moving slowly or fidgety/restless - - 2 -  Suicidal thoughts - - 0 -  PHQ-9 Score - - 11 -  Difficult doing work/chores - - Somewhat difficult -    HSV: Last outbreak - 3 weeks ago - only flair in years. Had been on 1gram for years, decided to try 5066mbut had problem with prescription. Had been off daily meds - off for a year.   Heartburn: Had been on zantac daily. Switched to pepcid. Also on tums daily. Barely controls heartburn- still some breakthrough sx's. On meds 10 years. No hx of PUD.  No eval with GI.   32 ounces of coffee per day, and tomato triggers.  Alcohol:few drinks every few weeks.  No tobacco.    Possible heart murmur at 48yo.  No chest pains. Lightheadedness or  dizziness.    Patient Active Problem List   Diagnosis Date Noted  . HSV-2 (herpes simplex virus 2) infection   . Sleep disorder, shift work    Past Medical History:  Diagnosis Date  . Depression   . HSV-2 (herpes simplex virus 2) infection   . Hypertension   . Sleep disorder, shift work    Past Surgical History:  Procedure Laterality Date  . VASECTOMY     Allergies  Allergen Reactions  . Prilosec [Omeprazole Magnesium] Rash   Prior to Admission medications   Medication Sig Start Date End Date Taking? Authorizing Provider  DULoxetine (CYMBALTA) 60 MG capsule TAKE 1 CAPSULE BY MOUTH EVERY DAY 01/27/18  Yes GrWendie AgresteMD  fluticasone ([facility] MCG/ACT nasal spray Place 2 sprays into both nostrils daily. 01/13/17  Yes GrWendie AgresteMD  valACYclovir (VALTREX) 500 MG tablet Take 1 tablet (500 mg total) by mouth 2 (two) times daily. 01/13/17  Yes GrWendie AgresteMD  zolpidem (AMBIEN) 10 MG tablet Take 0.5-1 tablets (5-10 mg total) by mouth at bedtime as needed for sleep. 07/17/17 12/09/17  GrWendie AgresteMD   Social History   Socioeconomic History  . Marital status: Married    Spouse name:  Erika Bonadio  . Number of children: 1  . Years of education: 69  . Highest education level: Not on file  Occupational History  . Occupation: Engineer, structural  Social Needs  . Financial resource strain: Not on file  . Food insecurity:    Worry: Not on file    Inability: Not on file  . Transportation needs:    Medical: Not on file    Non-medical: Not on file  Tobacco Use  . Smoking status: Former Smoker    Types: Cigars  . Smokeless tobacco: Never Used  . Tobacco comment: hx occasional cigars  Substance and Sexual Activity  . Alcohol use: Yes  . Drug use: No  . Sexual activity: Yes  Lifestyle  . Physical activity:    Days per week: Not on file    Minutes per session: Not on file  . Stress: Not on file  Relationships  . Social connections:    Talks on phone: Not  on file    Gets together: Not on file    Attends religious service: Not on file    Active member of club or organization: Not on file    Attends meetings of clubs or organizations: Not on file    Relationship status: Not on file  . Intimate partner violence:    Fear of current or ex partner: Not on file    Emotionally abused: Not on file    Physically abused: Not on file    Forced sexual activity: Not on file  Other Topics Concern  . Not on file  Social History Narrative   Lives with his wife.  His 23 year old son lives in [place].    Review of Systems     Objective:   Physical Exam Vitals signs reviewed.  Constitutional:      Appearance: He is well-developed.  HENT:     Head: Normocephalic and atraumatic.  Eyes:     Pupils: Pupils are equal, round, and reactive to light.  Neck:     Vascular: No carotid bruit or JVD.  Cardiovascular:     Rate and Rhythm: Normal rate and regular rhythm.     Heart sounds: Murmur (2/6 systolic. ) present.  Pulmonary:     Effort: Pulmonary effort is normal.     Breath sounds: Normal breath sounds. No rales.  Skin:    General: Skin is warm and dry.  Neurological:     Mental Status: He is alert and oriented to person, place, and time.    Vitals:   01/01/19 1350 01/01/19 1415  BP: (!) 169/93 (!) 148/82  Pulse: 68   Temp: 98.6 F (37 C)   TempSrc: Oral   SpO2: 98%   Weight: 248 lb 12.8 oz (112.9 kg)   Height: 6' 3"  (1.905 m)         Assessment & Plan:    Ethan Knox is a 48 y.o. male Depression, unspecified depression type - Plan: DULoxetine (CYMBALTA) 60 MG capsule  -Intermittent control.  Decided to remain his Cymbalta same dose, meet with therapist if increased anger/anxiety symptoms.  Will be following up in a few weeks for blood pressure.  Need for influenza vaccination - Plan: Flu Vaccine QUAD 36+ mos IM  Heartburn  -Pepcid, trigger avoidance discussed.  Discuss further in next few weeks, consider GI eval if  persistent  Heart murmur - Plan: Ambulatory referral to Cardiology  -Suspect Lauenstein murmur, asymptomatic.  Refer to cardiology for further evaluation and possible  echocardiogram.  Insomnia, unspecified type - Plan: zolpidem (AMBIEN) 10 MG tablet  -Shiftwork component.  Tolerating Ambien with as needed use.  HSV infection - Plan: valACYclovir (VALTREX) 500 MG tablet  -Restart Valtrex 500 mg daily dosing for prevention.  Elevated blood pressure reading  -Monitor home readings, return next few weeks with those readings to decide if medication indicated.  Meds ordered this encounter  Medications  . valACYclovir (VALTREX) 500 MG tablet    Sig: Take 1 tablet (500 mg total) by mouth daily.    Dispense:  90 tablet    Refill:  3  . DULoxetine (CYMBALTA) 60 MG capsule    Sig: TAKE 1 CAPSULE BY MOUTH EVERY DAY    Dispense:  90 capsule    Refill:  1  . zolpidem (AMBIEN) 10 MG tablet    Sig: Take 0.5-1 tablets (5-10 mg total) by mouth at bedtime as needed for sleep.    Dispense:  30 tablet    Refill:  1   Patient Instructions   Blood pressure is still slightly elevated today, I will check some blood work, but follow-up in the next few weeks with some home readings to decide if medications are needed.  No change in other medications for now.  I would recommend meeting with therapist if any worsening depression/anger  symptoms.  Restart valacyclovir 1 pill once per day for prevention, if any flare in symptoms can increase that to twice per day.  I do hear slight heart murmur today I will refer you to cardiology to evaluate that further and decide on possible echocardiogram  For heartburn ok to continue Pepcid.  See other foods to avoid below.  If any worsening symptom return for recheck.  We can discuss it further next visit, and decide if you need to meet with gastroenterology.  Please return if worsening.  Return to the clinic or go to the nearest emergency room if any of your  symptoms worsen or new symptoms occur.   Food Choices for Gastroesophageal Reflux Disease, Adult When you have gastroesophageal reflux disease (GERD), the foods you eat and your eating habits are very important. Choosing the right foods can help ease your discomfort. Think about working with a nutrition specialist (dietitian) to help you make good choices. What are tips for following this plan?  Meals  Choose healthy foods that are low in fat, such as fruits, vegetables, whole grains, low-fat dairy products, and lean meat, fish, and poultry.  Eat small meals often instead of 3 large meals a day. Eat your meals slowly, and in a place where you are relaxed. Avoid bending over or lying down until 2-3 hours after eating.  Avoid eating meals 2-3 hours before bed.  Avoid drinking a lot of liquid with meals.  Cook foods using methods other than frying. Bake, grill, or broil food instead.  Avoid or limit: ? Chocolate. ? Peppermint or spearmint. ? Alcohol. ? Pepper. ? Black and decaffeinated coffee. ? Black and decaffeinated tea. ? Bubbly (carbonated) soft drinks. ? Caffeinated energy drinks and soft drinks.  Limit high-fat foods such as: ? Fatty meat or fried foods. ? Whole milk, cream, butter, or ice cream. ? Nuts and nut butters. ? Pastries, donuts, and sweets made with butter or shortening.  Avoid foods that cause symptoms. These foods may be different for everyone. Common foods that cause symptoms include: ? Tomatoes. ? Oranges, lemons, and limes. ? Peppers. ? Spicy food. ? Onions and garlic. ? Vinegar. Lifestyle  Maintain  a healthy weight. Ask your doctor what weight is healthy for you. If you need to lose weight, work with your doctor to do so safely.  Exercise for at least 30 minutes for 5 or more days each week, or as told by your doctor.  Wear loose-fitting clothes.  Do not smoke. If you need help quitting, ask your doctor.  Sleep with the head of your bed higher  than your feet. Use a wedge under the mattress or blocks under the bed frame to raise the head of the bed. Summary  When you have gastroesophageal reflux disease (GERD), food and lifestyle choices are very important in easing your symptoms.  Eat small meals often instead of 3 large meals a day. Eat your meals slowly, and in a place where you are relaxed.  Limit high-fat foods such as fatty meat or fried foods.  Avoid bending over or lying down until 2-3 hours after eating.  Avoid peppermint and spearmint, caffeine, alcohol, and chocolate. This information is not intended to replace advice given to you by your health care provider. Make sure you discuss any questions you have with your health care provider. Document Released: 04/28/2012 Document Revised: 12/03/2016 Document Reviewed: 12/03/2016 Elsevier Interactive Patient Education  [employer].    If you have lab work done today you will be contacted with your lab results within the next 2 weeks.  If you have not heard from [place] then please contact us. The fastest way to get your results is to register for My Chart.   IF you received an x-ray today, you will receive an invoice from [facility]. Please contact [facility] at [phone] with questions or concerns regarding your invoice.   IF you received labwork today, you will receive an invoice from [place]. Please contact [facility] at [phone] with questions or concerns regarding your invoice.   Our billing staff will not be able to assist you with questions regarding bills from these companies.  You will be contacted with the lab results as soon as they are available. The fastest way to get your results is to activate your My Chart account. Instructions are located on the last page of this paperwork. If you have not heard from [place] regarding the results in 2 weeks, please contact this office.       Signed,   Merri Ray, MD [facility]  [place].  01/05/19 3:35 PM

## 2019-01-05 ENCOUNTER — Encounter: Payer: Self-pay | Admitting: Family Medicine

## 2019-01-28 ENCOUNTER — Telehealth: Payer: Self-pay

## 2019-01-28 NOTE — Telephone Encounter (Signed)
   Cardiac Questionnaire:    Since your last visit or hospitalization:    1. Have you been having new or worsening chest pain? NO   2. Have you been having new or worsening shortness of breath? NO 3. Have you been having new or worsening leg swelling, wt gain, or increase in abdominal girth (pants fitting more tightly)? NO   4. Have you had any passing out spells? NO    *A YES to any of these questions would result in the appointment being kept. *If all the answers to these questions are NO, we should indicate that given the current situation regarding the worldwide coronarvirus pandemic, at the recommendation of the CDC, we are looking to limit gatherings in our waiting area, and thus will reschedule their appointment beyond four weeks from today.   _____________   CZYSA-63 Pre-Screening Questions:  . Have you recently travelled abroad or to [place], [place] state, or [place]? NO . Do you currently have a fever? NO . Have you been in contact with someone that is currently pending confirmation of Covid19 testing or has been confirmed to have the [place] virus?  WIFE HAS NOT BEEN TESTED FOR COVID19. WIFE HAS SYMPTOMS; DID ONLINE QUESTIONNAIRE (E-VISIT) AND CURRENTLY HAS FEVER, COUGH, RUNNY NOSE, CHEST PRESSURE, HEADACHE . Are you currently experiencing fatigue or cough? NO . Are you currently experiencing new or worsening shortness of breath at rest or with minimal activity? NO . Have you been in contact with someone that was recently sick with fever/cough/fatigue? NO  PATIENT AGREEABLE WITH MD-RECOMMENDED RESCHEDULE OF 6-8 WEEKS. INFORMED PATIENT THAT HE WOULD BE CONTACTED TO SCHEDULE NEW APPOINTMENT TIME AND DATE

## 2019-01-29 ENCOUNTER — Ambulatory Visit: Payer: 59 | Admitting: Cardiovascular Disease

## 2019-02-01 ENCOUNTER — Ambulatory Visit: Payer: 59 | Admitting: Family Medicine

## 2019-02-05 ENCOUNTER — Telehealth: Payer: Self-pay | Admitting: Cardiovascular Disease

## 2019-02-05 NOTE — Telephone Encounter (Signed)
Called patient and LVM to call back to schedule new patient appointment with Dr. Gwenlyn Found in 6 to 8 weeks.

## 2019-04-27 ENCOUNTER — Telehealth: Payer: Self-pay | Admitting: Family Medicine

## 2019-04-27 DIAGNOSIS — B009 Herpesviral infection, unspecified: Secondary | ICD-10-CM

## 2019-04-27 NOTE — Telephone Encounter (Signed)
Please see pharmacy request for how to dispense and refills. / Per office note on 12-23-2018: HSV infection - Plan: valACYclovir (VALTREX) 500 MG tablet             -Restart Valtrex 500 mg daily dosing for prevention. Meds ordered this encounter  Medications  . valACYclovir (VALTREX) 500 MG tablet    Sig: Take 1 tablet (500 mg total) by mouth daily.    Dispense:  90 tablet    Refill:  3

## 2019-05-04 ENCOUNTER — Telehealth: Payer: Self-pay

## 2019-05-04 ENCOUNTER — Other Ambulatory Visit: Payer: Self-pay

## 2019-05-04 DIAGNOSIS — B009 Herpesviral infection, unspecified: Secondary | ICD-10-CM

## 2019-05-04 MED ORDER — VALACYCLOVIR HCL 500 MG PO TABS: 500.0000 mg | ORAL_TABLET | Freq: Every day | ORAL | 3 refills | Status: DC

## 2019-05-04 NOTE — Telephone Encounter (Signed)
Patient is requesting a call back from CMA to discuss this medication. He states he was not informed of any sort of change in medication.

## 2019-08-22 ENCOUNTER — Other Ambulatory Visit: Payer: Self-pay | Admitting: Family Medicine

## 2019-08-22 DIAGNOSIS — F329 Major depressive disorder, single episode, unspecified: Secondary | ICD-10-CM

## 2019-08-22 DIAGNOSIS — F32A Depression, unspecified: Secondary | ICD-10-CM

## 2019-08-25 ENCOUNTER — Telehealth: Payer: Self-pay | Admitting: *Deleted

## 2019-08-25 ENCOUNTER — Other Ambulatory Visit: Payer: Self-pay | Admitting: Family Medicine

## 2019-08-25 DIAGNOSIS — B009 Herpesviral infection, unspecified: Secondary | ICD-10-CM

## 2019-08-25 NOTE — Telephone Encounter (Signed)
Spoke with pt and scheduled appt

## 2019-08-25 NOTE — Telephone Encounter (Signed)
Requested medication (s) are due for refill today: yes  Requested medication (s) are on the active medication list: yes  Last refill:  08/25/2019  Future visit scheduled: yes  Notes to clinic:  Alternative Requested:NOT COVERED, INSURANCE PREFERES ACYCLOVIR.   Requested Prescriptions  Pending Prescriptions Disp Refills   acyclovir (ZOVIRAX) 400 MG tablet [Pharmacy Med Name: ACYCLOVIR 400 MG TABLET]  0    Sig: Please specify directions, refills and quantity     Antimicrobials:  Antiviral Agents - Anti-Herpetic Passed - 08/25/2019 10:22 AM      Passed - Valid encounter within last 12 months    Recent Outpatient Visits          7 months ago Depression, unspecified depression type   [facility], Ranell Patrick, MD   1 year ago Depression with anxiety   [facility], Ranell Patrick, MD   2 years ago Annual physical exam   [facility], Ranell Patrick, MD   2 years ago Insomnia, unspecified type   [facility] [place], [place]   3 years ago Insomnia   [facility], [facility]

## 2019-08-25 NOTE — Telephone Encounter (Signed)
Please call patient schedule Dr. Carlota Raspberry.  30 day supply sent in.  No more refills until seen.

## 2019-09-06 ENCOUNTER — Ambulatory Visit: Payer: 59 | Admitting: Family Medicine

## 2019-09-13 ENCOUNTER — Ambulatory Visit: Payer: 59 | Admitting: Family Medicine

## 2019-09-13 ENCOUNTER — Other Ambulatory Visit: Payer: Self-pay

## 2019-09-13 VITALS — BP 156/92 | HR 65 | Temp 98.5°F | Wt 255.4 lb

## 2019-09-13 DIAGNOSIS — I10 Essential (primary) hypertension: Secondary | ICD-10-CM | POA: Diagnosis not present

## 2019-09-13 DIAGNOSIS — F329 Major depressive disorder, single episode, unspecified: Secondary | ICD-10-CM | POA: Diagnosis not present

## 2019-09-13 DIAGNOSIS — Z23 Encounter for immunization: Secondary | ICD-10-CM | POA: Diagnosis not present

## 2019-09-13 DIAGNOSIS — G4700 Insomnia, unspecified: Secondary | ICD-10-CM | POA: Diagnosis not present

## 2019-09-13 DIAGNOSIS — E785 Hyperlipidemia, unspecified: Secondary | ICD-10-CM

## 2019-09-13 DIAGNOSIS — F32A Depression, unspecified: Secondary | ICD-10-CM

## 2019-09-13 MED ORDER — AMLODIPINE BESYLATE 2.5 MG PO TABS: 2.5000 mg | ORAL_TABLET | Freq: Every day | ORAL | 2 refills | Status: DC

## 2019-09-13 MED ORDER — DULOXETINE HCL 60 MG PO CPEP: 60.0000 mg | ORAL_CAPSULE | Freq: Every day | ORAL | 2 refills | Status: DC

## 2019-09-13 MED ORDER — ZOLPIDEM TARTRATE 10 MG PO TABS: 5.0000 mg | ORAL_TABLET | Freq: Every evening | ORAL | 1 refills | Status: DC | PRN

## 2019-09-13 NOTE — Patient Instructions (Addendum)
Start amlodipine once per day.  Monitor blood pressures outside of the office and bring record to next visit in 6 months.  If any side effects on that medication please let me know.  I will check some electrolytes including cholesterol that has been elevated in the past.  Ambien was refilled as needed.  Cymbalta same dose for now.  Thanks for coming in today.  Call cardiology to reschedule visit for heart murmur. Let me know if a new referral needed: Ethan Knox at [facility] Address: [address], [place], [place] [address]  Phone: [phone]   Hypertension, Adult High blood pressure (hypertension) is when the force of blood pumping through the arteries is too strong. The arteries are the blood vessels that carry blood from the heart throughout the body. Hypertension forces the heart to work harder to pump blood and may cause arteries to become narrow or stiff. Untreated or uncontrolled hypertension can cause a heart attack, heart failure, a stroke, kidney disease, and other problems. A blood pressure reading consists of a higher number over a lower number. Ideally, your blood pressure should be below 120/80. The first ("top") number is called the systolic pressure. It is a measure of the pressure in your arteries as your heart beats. The second ("bottom") number is called the diastolic pressure. It is a measure of the pressure in your arteries as the heart relaxes. What are the causes? The exact cause of this condition is not known. There are some conditions that result in or are related to high blood pressure. What increases the risk? Some risk factors for high blood pressure are under your control. The following factors may make you more likely to develop this condition:  Smoking.  Having type 2 diabetes mellitus, high cholesterol, or both.  Not getting enough exercise or physical activity.  Being overweight.  Having too much fat, sugar, calories, or  salt (sodium) in your diet.  Drinking too much alcohol. Some risk factors for high blood pressure may be difficult or impossible to change. Some of these factors include:  Having chronic kidney disease.  Having a family history of high blood pressure.  Age. Risk increases with age.  Race. You may be at higher risk if you are African American.  Gender. Men are at higher risk than women before age 66. After age 63, women are at higher risk than men.  Having obstructive sleep apnea.  Stress. What are the signs or symptoms? High blood pressure may not cause symptoms. Very high blood pressure (hypertensive crisis) may cause:  Headache.  Anxiety.  Shortness of breath.  Nosebleed.  Nausea and vomiting.  Vision changes.  Severe chest pain.  Seizures. How is this diagnosed? This condition is diagnosed by measuring your blood pressure while you are seated, with your arm resting on a flat surface, your legs uncrossed, and your feet flat on the floor. The cuff of the blood pressure monitor will be placed directly against the skin of your upper arm at the level of your heart. It should be measured at least twice using the same arm. Certain conditions can cause a difference in blood pressure between your right and left arms. Certain factors can cause blood pressure readings to be lower or higher than normal for a short period of time:  When your blood pressure is higher when you are in a health care provider's office than when you are at home, this is called white coat hypertension. Most people  with this condition do not need medicines.  When your blood pressure is higher at home than when you are in a health care provider's office, this is called masked hypertension. Most people with this condition may need medicines to control blood pressure. If you have a high blood pressure reading during one visit or you have normal blood pressure with other risk factors, you may be asked  to:  Return on a different day to have your blood pressure checked again.  Monitor your blood pressure at home for 1 week or longer. If you are diagnosed with hypertension, you may have other blood or imaging tests to help your health care provider understand your overall risk for other conditions. How is this treated? This condition is treated by making healthy lifestyle changes, such as eating healthy foods, exercising more, and reducing your alcohol intake. Your health care provider may prescribe medicine if lifestyle changes are not enough to get your blood pressure under control, and if:  Your systolic blood pressure is above 130.  Your diastolic blood pressure is above 80. Your personal target blood pressure may vary depending on your medical conditions, your age, and other factors. Follow these instructions at home: Eating and drinking   Eat a diet that is high in fiber and potassium, and low in sodium, added sugar, and fat. An example eating plan is called the DASH (Dietary Approaches to Stop Hypertension) diet. To eat this way: ? Eat plenty of fresh fruits and vegetables. Try to fill one half of your plate at each meal with fruits and vegetables. ? Eat whole grains, such as whole-wheat pasta, brown rice, or whole-grain bread. Fill about one fourth of your plate with whole grains. ? Eat or drink low-fat dairy products, such as skim milk or low-fat yogurt. ? Avoid fatty cuts of meat, processed or cured meats, and poultry with skin. Fill about one fourth of your plate with lean proteins, such as fish, chicken without skin, beans, eggs, or tofu. ? Avoid pre-made and processed foods. These tend to be higher in sodium, added sugar, and fat.  Reduce your daily sodium intake. Most people with hypertension should eat less than 1,500 mg of sodium a day.  Do not drink alcohol if: ? Your health care provider tells you not to drink. ? You are pregnant, may be pregnant, or are planning to  become pregnant.  If you drink alcohol: ? Limit how much you use to:  0-1 drink a day for women.  0-2 drinks a day for men. ? Be aware of how much alcohol is in your drink. In the [place] one drink equals one 12 oz bottle of beer (355 mL), one 5 oz glass of wine (148 mL), or one 1 oz glass of hard liquor (44 mL). Lifestyle   Work with your health care provider to maintain a healthy body weight or to lose weight. Ask what an ideal weight is for you.  Get at least 30 minutes of exercise most days of the week. Activities may include walking, swimming, or biking.  Include exercise to strengthen your muscles (resistance exercise), such as Pilates or lifting weights, as part of your weekly exercise routine. Try to do these types of exercises for 30 minutes at least 3 days a week.  Do not use any products that contain nicotine or tobacco, such as cigarettes, e-cigarettes, and chewing tobacco. If you need help quitting, ask your health care provider.  Monitor your blood pressure at home as told  by your health care provider.  Keep all follow-up visits as told by your health care provider. This is important. Medicines  Take over-the-counter and prescription medicines only as told by your health care provider. Follow directions carefully. Blood pressure medicines must be taken as prescribed.  Do not skip doses of blood pressure medicine. Doing this puts you at risk for problems and can make the medicine less effective.  Ask your health care provider about side effects or reactions to medicines that you should watch for. Contact a health care provider if you:  Think you are having a reaction to a medicine you are taking.  Have headaches that keep coming back (recurring).  Feel dizzy.  Have swelling in your ankles.  Have trouble with your vision. Get help right away if you:  Develop a severe headache or confusion.  Have unusual weakness or numbness.  Feel faint.  Have severe pain  in your chest or abdomen.  Vomit repeatedly.  Have trouble breathing. Summary  Hypertension is when the force of blood pumping through your arteries is too strong. If this condition is not controlled, it may put you at risk for serious complications.  Your personal target blood pressure may vary depending on your medical conditions, your age, and other factors. For most people, a normal blood pressure is less than 120/80.  Hypertension is treated with lifestyle changes, medicines, or a combination of both. Lifestyle changes include losing weight, eating a healthy, low-sodium diet, exercising more, and limiting alcohol. This information is not intended to replace advice given to you by your health care provider. Make sure you discuss any questions you have with your health care provider. Document Released: 10/28/2005 Document Revised: 07/08/2018 Document Reviewed: 07/08/2018 Elsevier Patient Education  [employer].   If you have lab work done today you will be contacted with your lab results within the next 2 weeks.  If you have not heard from [place] then please contact us. The fastest way to get your results is to register for My Chart.   IF you received an x-ray today, you will receive an invoice from [facility]. Please contact [facility] at [phone] with questions or concerns regarding your invoice.   IF you received labwork today, you will receive an invoice from [facility]. Please contact [facility] at [phone] with questions or concerns regarding your invoice.   Our billing staff will not be able to assist you with questions regarding bills from these companies.  You will be contacted with the lab results as soon as they are available. The fastest way to get your results is to activate your My Chart account. Instructions are located on the last page of this paperwork. If you have not heard from [place] regarding the results in 2 weeks, please contact this  office.

## 2019-09-13 NOTE — Progress Notes (Signed)
Subjective:    Patient ID: Ethan Knox, male    DOB: 1971/07/26, 48 y.o.   MRN: [id number]  HPI Ethan Knox is a 48 y.o. male Presents today for: Chief Complaint  Patient presents with   Medical Management of Chronic Issues    f/u for med refills. Patient stated at his loast visit with you discussed heart mummur and had an appt for an Evisit but but when covid hit they have not reached out to me. Not sure if need to set up another appt.   Heart murmur: Referred to cardiology in February. Called and planned on evisit, but he wanted in person visit.   Depression: Discussed in February.  Intermittent controlled that time but decided to continue same dose of Cymbalta 60 mg daily.  Recommended meeting with therapist if increased anger anxiety symptoms. Mood doing well lately. Did meet with a therapist -min benefit. Did get a promotion at work - doing better - good stress.  No new side effects with Cymbalta. Wants to stay at some dose -  Night shift strictly now.  Lorrin Mais still used - trying to space out dosing. Has about 15 left.  No manic symptoms.  Controlled substance database (PDMP) reviewed. No concerns appreciated.  Last filled 05/04/19    Depression screen [facility] 2/9 09/13/2019 01/01/2019 01/27/2018 07/17/2017 01/13/2017  Decreased Interest 0 0 0 2 0  Down, Depressed, Hopeless 0 0 0 1 0  PHQ - 2 Score 0 0 0 3 0  Altered sleeping - - - 3 -  Tired, decreased energy - - - 1 -  Change in appetite - - - 0 -  Feeling bad or failure about yourself  - - - 1 -  Trouble concentrating - - - 1 -  Moving slowly or fidgety/restless - - - 2 -  Suicidal thoughts - - - 0 -  PHQ-9 Score - - - 11 -  Difficult doing work/chores - - - Somewhat difficult -   Hypertension: BP Readings from Last 3 Encounters:  09/13/19 (!) 156/92  01/01/19 (!) 148/82  01/27/18 130/80   Lab Results  Component Value Date   CREATININE 0.72 (L) 08/26/2017  Elevated blood pressure in February, advised  to follow-up within a few weeks with home readings. Alcohol: less recently, none in past month. Prior 1-2 week.  Has already changed diet since last visit.  Heartburn better.  Exercise decreased at work since promotion. Still walking daily.  Wt Readings from Last 3 Encounters:  09/13/19 255 lb 6.4 oz (115.8 kg)  01/01/19 248 lb 12.8 oz (112.9 kg)  01/27/18 249 lb 12.8 oz (113.3 kg)     Patient Active Problem List   Diagnosis Date Noted   HSV-2 (herpes simplex virus 2) infection    Sleep disorder, shift work    Past Medical History:  Diagnosis Date   Depression    HSV-2 (herpes simplex virus 2) infection    Hypertension    Sleep disorder, shift work    Past Surgical History:  Procedure Laterality Date   VASECTOMY     Allergies  Allergen Reactions   Prilosec [Omeprazole Magnesium] Rash   Prior to Admission medications   Medication Sig Start Date End Date Taking? Authorizing Provider  DULoxetine (CYMBALTA) 60 MG capsule TAKE 1 CAPSULE BY MOUTH EVERY DAY 08/25/19  Yes [name], MD  valACYclovir (VALTREX) 500 MG tablet Take 1 tablet (500 mg total) by mouth daily. 05/04/19  Yes [name], MD  acyclovir (ZOVIRAX) 400 MG tablet Please specify directions, refills and quantity 04/27/19   [name], MD  fluticasone [facility]) 50 MCG/ACT nasal spray Place 2 sprays into both nostrils daily. 01/13/17   [name], MD  zolpidem (AMBIEN) 10 MG tablet Take 0.5-1 tablets (5-10 mg total) by mouth at bedtime as needed for sleep. 01/01/19 05/26/19  [name], MD   Social History   Socioeconomic History   Marital status: Married    Spouse name: [name]   Number of children: 1   Years of education: 14   Highest education level: Not on file  Occupational History   Occupation: Media planner strain: Not on file   Food insecurity    Worry: Not on file    Inability: Not on file   Transportation needs     Medical: Not on file    Non-medical: Not on file  Tobacco Use   Smoking status: Former Smoker    Types: Cigars   Smokeless tobacco: Never Used   Tobacco comment: hx occasional cigars  Substance and Sexual Activity   Alcohol use: Yes   Drug use: No   Sexual activity: Yes  Lifestyle   Physical activity    Days per week: Not on file    Minutes per session: Not on file   Stress: Not on file  Relationships   Social connections    Talks on phone: Not on file    Gets together: Not on file    Attends religious service: Not on file    Active member of club or organization: Not on file    Attends meetings of clubs or organizations: Not on file    Relationship status: Not on file   Intimate partner violence    Fear of current or ex partner: Not on file    Emotionally abused: Not on file    Physically abused: Not on file    Forced sexual activity: Not on file  Other Topics Concern   Not on file  Social History Narrative   Lives with his wife.  His 57 year old son lives in [place].    Review of Systems  Constitutional: Negative for fatigue and unexpected weight change.  Eyes: Negative for visual disturbance.  Respiratory: Negative for cough, chest tightness and shortness of breath.   Cardiovascular: Negative for chest pain, palpitations and leg swelling.  Gastrointestinal: Negative for abdominal pain and blood in stool.  Neurological: Negative for dizziness, light-headedness and headaches.       Objective:   Physical Exam Vitals signs reviewed.  Constitutional:      Appearance: He is well-developed.  HENT:     Head: Normocephalic and atraumatic.  Eyes:     Pupils: Pupils are equal, round, and reactive to light.  Neck:     Vascular: No carotid bruit or JVD.  Cardiovascular:     Rate and Rhythm: Normal rate and regular rhythm.     Heart sounds: Murmur (5-5/7 systolic. ) present.  Pulmonary:     Effort: Pulmonary effort is normal.     Breath sounds: Normal  breath sounds. No rales.  Skin:    General: Skin is warm and dry.  Neurological:     Mental Status: He is alert and oriented to person, place, and time.    Vitals:   09/13/19 1106 09/13/19 1107  BP: (!) 157/95 (!) 156/92  Pulse: 65   Temp: 98.5 F (36.9 C)  TempSrc: Oral   SpO2: 97%   Weight: 255 lb 6.4 oz (115.8 kg)           Assessment & Plan:    Ethan Knox is a 48 y.o. male Essential hypertension - Plan: amLODipine (NORVASC) 2.5 MG tablet, Comprehensive metabolic panel, TSH  -Start amlodipine as now hypertensive.  Check labs above.  Recheck in 6 months, but monitor home readings, RTC precautions given sooner.  Depression, unspecified depression type - Plan: DULoxetine (CYMBALTA) 60 MG capsule  -Stable on current dose of Cymbalta.  Feels better with promotion.  Continue same  Insomnia, unspecified type - Plan: zolpidem (AMBIEN) 10 MG tablet  -Trying to decrease use now that he has a consistent work schedule.  Refilled for as needed use, not daily needed.  Need for prophylactic vaccination and inoculation against influenza - Plan: Flu Vaccine QUAD 6+ mos PF IM (Fluarix Quad PF)  Hyperlipidemia, unspecified hyperlipidemia type - Plan: Lipid panel  -Repeat testing, but anticipate improvement with diet/exercise changes.  Consider recheck 6 months.  Meds ordered this encounter  Medications   DULoxetine (CYMBALTA) 60 MG capsule    Sig: Take 1 capsule (60 mg total) by mouth daily.    Dispense:  90 capsule    Refill:  2   zolpidem (AMBIEN) 10 MG tablet    Sig: Take 0.5-1 tablets (5-10 mg total) by mouth at bedtime as needed for sleep.    Dispense:  30 tablet    Refill:  1   amLODipine (NORVASC) 2.5 MG tablet    Sig: Take 1 tablet (2.5 mg total) by mouth daily.    Dispense:  90 tablet    Refill:  2   Patient Instructions   Start amlodipine once per day.  Monitor blood pressures outside of the office and bring record to next visit in 6 months.  If any  side effects on that medication please let me know.  I will check some electrolytes including cholesterol that has been elevated in the past.  Ambien was refilled as needed.  Cymbalta same dose for now.  Thanks for coming in today.  Call cardiology to reschedule visit for heart murmur. Let me know if a new referral needed: [place] [facility] Address: [address], [place],  [address]  Phone: [phone]   Hypertension, Adult High blood pressure (hypertension) is when the force of blood pumping through the arteries is too strong. The arteries are the blood vessels that carry blood from the heart throughout the body. Hypertension forces the heart to work harder to pump blood and may cause arteries to become narrow or stiff. Untreated or uncontrolled hypertension can cause a heart attack, heart failure, a stroke, kidney disease, and other problems. A blood pressure reading consists of a higher number over a lower number. Ideally, your blood pressure should be below 120/80. The first ("top") number is called the systolic pressure. It is a measure of the pressure in your arteries as your heart beats. The second ("bottom") number is called the diastolic pressure. It is a measure of the pressure in your arteries as the heart relaxes. What are the causes? The exact cause of this condition is not known. There are some conditions that result in or are related to high blood pressure. What increases the risk? Some risk factors for high blood pressure are under your control. The following factors may make you more likely to develop this condition:  Smoking.  Having type  2 diabetes mellitus, high cholesterol, or both.  Not getting enough exercise or physical activity.  Being overweight.  Having too much fat, sugar, calories, or salt (sodium) in your diet.  Drinking too much alcohol. Some risk factors for high blood pressure may be difficult or  impossible to change. Some of these factors include:  Having chronic kidney disease.  Having a family history of high blood pressure.  Age. Risk increases with age.  Race. You may be at higher risk if you are African American.  Gender. Men are at higher risk than women before age 18. After age 76, women are at higher risk than men.  Having obstructive sleep apnea.  Stress. What are the signs or symptoms? High blood pressure may not cause symptoms. Very high blood pressure (hypertensive crisis) may cause:  Headache.  Anxiety.  Shortness of breath.  Nosebleed.  Nausea and vomiting.  Vision changes.  Severe chest pain.  Seizures. How is this diagnosed? This condition is diagnosed by measuring your blood pressure while you are seated, with your arm resting on a flat surface, your legs uncrossed, and your feet flat on the floor. The cuff of the blood pressure monitor will be placed directly against the skin of your upper arm at the level of your heart. It should be measured at least twice using the same arm. Certain conditions can cause a difference in blood pressure between your right and left arms. Certain factors can cause blood pressure readings to be lower or higher than normal for a short period of time:  When your blood pressure is higher when you are in a health care provider's office than when you are at home, this is called white coat hypertension. Most people with this condition do not need medicines.  When your blood pressure is higher at home than when you are in a health care provider's office, this is called masked hypertension. Most people with this condition may need medicines to control blood pressure. If you have a high blood pressure reading during one visit or you have normal blood pressure with other risk factors, you may be asked to:  Return on a different day to have your blood pressure checked again.  Monitor your blood pressure at home for 1 week or  longer. If you are diagnosed with hypertension, you may have other blood or imaging tests to help your health care provider understand your overall risk for other conditions. How is this treated? This condition is treated by making healthy lifestyle changes, such as eating healthy foods, exercising more, and reducing your alcohol intake. Your health care provider may prescribe medicine if lifestyle changes are not enough to get your blood pressure under control, and if:  Your systolic blood pressure is above 130.  Your diastolic blood pressure is above 80. Your personal target blood pressure may vary depending on your medical conditions, your age, and other factors. Follow these instructions at home: Eating and drinking   Eat a diet that is high in fiber and potassium, and low in sodium, added sugar, and fat. An example eating plan is called the DASH (Dietary Approaches to Stop Hypertension) diet. To eat this way: ? Eat plenty of fresh fruits and vegetables. Try to fill one half of your plate at each meal with fruits and vegetables. ? Eat whole grains, such as whole-wheat pasta, brown rice, or whole-grain bread. Fill about one fourth of your plate with whole grains. ? Eat or drink low-fat dairy products, such as  skim milk or low-fat yogurt. ? Avoid fatty cuts of meat, processed or cured meats, and poultry with skin. Fill about one fourth of your plate with lean proteins, such as fish, chicken without skin, beans, eggs, or tofu. ? Avoid pre-made and processed foods. These tend to be higher in sodium, added sugar, and fat.  Reduce your daily sodium intake. Most people with hypertension should eat less than 1,500 mg of sodium a day.  Do not drink alcohol if: ? Your health care provider tells you not to drink. ? You are pregnant, may be pregnant, or are planning to become pregnant.  If you drink alcohol: ? Limit how much you use to:  0-1 drink a day for women.  0-2 drinks a day for  men. ? Be aware of how much alcohol is in your drink. In the [place] one drink equals one 12 oz bottle of beer (355 mL), one 5 oz glass of wine (148 mL), or one 1 oz glass of hard liquor (44 mL). Lifestyle   Work with your health care provider to maintain a healthy body weight or to lose weight. Ask what an ideal weight is for you.  Get at least 30 minutes of exercise most days of the week. Activities may include walking, swimming, or biking.  Include exercise to strengthen your muscles (resistance exercise), such as Pilates or lifting weights, as part of your weekly exercise routine. Try to do these types of exercises for 30 minutes at least 3 days a week.  Do not use any products that contain nicotine or tobacco, such as cigarettes, e-cigarettes, and chewing tobacco. If you need help quitting, ask your health care provider.  Monitor your blood pressure at home as told by your health care provider.  Keep all follow-up visits as told by your health care provider. This is important. Medicines  Take over-the-counter and prescription medicines only as told by your health care provider. Follow directions carefully. Blood pressure medicines must be taken as prescribed.  Do not skip doses of blood pressure medicine. Doing this puts you at risk for problems and can make the medicine less effective.  Ask your health care provider about side effects or reactions to medicines that you should watch for. Contact a health care provider if you:  Think you are having a reaction to a medicine you are taking.  Have headaches that keep coming back (recurring).  Feel dizzy.  Have swelling in your ankles.  Have trouble with your vision. Get help right away if you:  Develop a severe headache or confusion.  Have unusual weakness or numbness.  Feel faint.  Have severe pain in your chest or abdomen.  Vomit repeatedly.  Have trouble breathing. Summary  Hypertension is when the force of blood  pumping through your arteries is too strong. If this condition is not controlled, it may put you at risk for serious complications.  Your personal target blood pressure may vary depending on your medical conditions, your age, and other factors. For most people, a normal blood pressure is less than 120/80.  Hypertension is treated with lifestyle changes, medicines, or a combination of both. Lifestyle changes include losing weight, eating a healthy, low-sodium diet, exercising more, and limiting alcohol. This information is not intended to replace advice given to you by your health care provider. Make sure you discuss any questions you have with your health care provider. Document Released: 10/28/2005 Document Revised: 07/08/2018 Document Reviewed: 07/08/2018 Elsevier Patient Education  2020 [employer].  If you have lab work done today you will be contacted with your lab results within the next 2 weeks.  If you have not heard from [place] then please contact us. The fastest way to get your results is to register for My Chart.   IF you received an x-ray today, you will receive an invoice from [facility]. Please contact [facility] at [phone] with questions or concerns regarding your invoice.   IF you received labwork today, you will receive an invoice from [place]. Please contact [facility] at [phone] with questions or concerns regarding your invoice.   Our billing staff will not be able to assist you with questions regarding bills from these companies.  You will be contacted with the lab results as soon as they are available. The fastest way to get your results is to activate your My Chart account. Instructions are located on the last page of this paperwork. If you have not heard from [place] regarding the results in 2 weeks, please contact this office.       Signed,   Merri Ray, MD [facility].  09/14/19 1:48 PM

## 2019-09-14 ENCOUNTER — Encounter: Payer: Self-pay | Admitting: Family Medicine

## 2019-09-14 LAB — TSH: TSH: 2.96 u[IU]/mL (ref 0.450–4.500)

## 2019-09-14 LAB — COMPREHENSIVE METABOLIC PANEL
ALT: 54 IU/L — ABNORMAL HIGH (ref 0–44)
AST: 31 IU/L (ref 0–40)
Albumin/Globulin Ratio: 1.6 (ref 1.2–2.2)
Albumin: 4.7 g/dL (ref 4.0–5.0)
Alkaline Phosphatase: 85 IU/L (ref 39–117)
BUN/Creatinine Ratio: 20 (ref 9–20)
BUN: 17 mg/dL (ref 6–24)
Bilirubin Total: 0.5 mg/dL (ref 0.0–1.2)
CO2: 19 mmol/L — ABNORMAL LOW (ref 20–29)
Calcium: 9.7 mg/dL (ref 8.7–10.2)
Chloride: 100 mmol/L (ref 96–106)
Creatinine, Ser: 0.86 mg/dL (ref 0.76–1.27)
GFR calc Af Amer: 120 mL/min/{1.73_m2} (ref >59)
GFR calc non Af Amer: 104 mL/min/{1.73_m2} (ref >59)
Globulin, Total: 2.9 g/dL (ref 1.5–4.5)
Glucose: 128 mg/dL — ABNORMAL HIGH (ref 65–99)
Potassium: 4.2 mmol/L (ref 3.5–5.2)
Sodium: 138 mmol/L (ref 134–144)
Total Protein: 7.6 g/dL (ref 6.0–8.5)

## 2019-09-14 LAB — LIPID PANEL
Chol/HDL Ratio: 4.6 ratio (ref 0.0–5.0)
Cholesterol, Total: 195 mg/dL (ref 100–199)
HDL: 42 mg/dL (ref >39)
LDL Chol Calc (NIH): 107 mg/dL — ABNORMAL HIGH (ref 0–99)
Triglycerides: 269 mg/dL — ABNORMAL HIGH (ref 0–149)
VLDL Cholesterol Cal: 46 mg/dL — ABNORMAL HIGH (ref 5–40)

## 2019-10-15 ENCOUNTER — Encounter: Payer: Self-pay | Admitting: Family Medicine

## 2019-12-03 ENCOUNTER — Emergency Department (HOSPITAL_COMMUNITY)
Admission: EM | Admit: 2019-12-03 | Discharge: 2019-12-03 | Disposition: A | Discharge status: Home / Self Care | Payer: 59 | Attending: Emergency Medicine | Admitting: Emergency Medicine

## 2019-12-03 ENCOUNTER — Other Ambulatory Visit: Payer: Self-pay

## 2019-12-03 DIAGNOSIS — Y9389 Activity, other specified: Secondary | ICD-10-CM | POA: Diagnosis not present

## 2019-12-03 DIAGNOSIS — W260XXA Contact with knife, initial encounter: Secondary | ICD-10-CM | POA: Insufficient documentation

## 2019-12-03 DIAGNOSIS — Z87891 Personal history of nicotine dependence: Secondary | ICD-10-CM | POA: Diagnosis not present

## 2019-12-03 DIAGNOSIS — Z23 Encounter for immunization: Secondary | ICD-10-CM | POA: Diagnosis not present

## 2019-12-03 DIAGNOSIS — Y929 Unspecified place or not applicable: Secondary | ICD-10-CM | POA: Insufficient documentation

## 2019-12-03 DIAGNOSIS — S61412A Laceration without foreign body of left hand, initial encounter: Secondary | ICD-10-CM | POA: Diagnosis not present

## 2019-12-03 DIAGNOSIS — I10 Essential (primary) hypertension: Secondary | ICD-10-CM | POA: Diagnosis not present

## 2019-12-03 DIAGNOSIS — Z79899 Other long term (current) drug therapy: Secondary | ICD-10-CM | POA: Diagnosis not present

## 2019-12-03 DIAGNOSIS — Y999 Unspecified external cause status: Secondary | ICD-10-CM | POA: Insufficient documentation

## 2019-12-03 MED ORDER — LIDOCAINE HCL 2 % IJ SOLN
10.0000 mL | Freq: Once | INTRAMUSCULAR | Status: AC
  Administered 2019-12-03: 200 mg via INTRADERMAL
  Filled 2019-12-03: qty 20

## 2019-12-03 MED ORDER — TETANUS-DIPHTH-ACELL PERTUSSIS 5-2.5-18.5 LF-MCG/0.5 IM SUSP
0.5000 mL | Freq: Once | INTRAMUSCULAR | Status: AC
  Administered 2019-12-03: 0.5 mL via INTRAMUSCULAR
  Filled 2019-12-03: qty 0.5

## 2019-12-03 NOTE — ED Notes (Signed)
Pt verbalizes understanding of DC instructions. Pt belongings returned and is ambulatory out of ED.  

## 2019-12-03 NOTE — Discharge Instructions (Addendum)
WOUND CARE Please have your stitches/staples removed in 7days or sooner if you have concerns. You may do this at any available urgent care or at your primary care doctor's office.  Keep area clean and dry for 24 hours. Do not remove bandage, if applied.  After 24 hours, remove bandage and wash wound gently with mild soap and warm water. Reapply a new bandage after cleaning wound, if directed.  Continue daily cleansing with soap and water until stitches/staples are removed.  Do not apply any ointments or creams to the wound while stitches/staples are in place, as this may cause delayed healing.  Seek medical careif you experience any of the following signs of infection: Swelling, redness, pus drainage, streaking, fever >101.0 F  Seek care if you experience excessive bleeding that does not stop after 15-20 minutes of constant, firm pressure.

## 2019-12-03 NOTE — ED Triage Notes (Signed)
Pt presents to ED via POV from home after cuting his left palm with a knife while attempted to whittle a wooden handle. Laceration is about 1.5 long, adipose tissue visible and still bleeding. Dressing applied in triage and bleeding controlled.

## 2019-12-03 NOTE — ED Notes (Signed)
  Abigal PA at bedside

## 2019-12-03 NOTE — ED Provider Notes (Signed)
[facility] DEPT Provider Note   CSN: [id number] Arrival date & time: 12/03/19  2105     History Chief Complaint  Patient presents with  . Laceration    Ethan Knox is a 49 y.o. male who presents emergency department chief complaint of laceration.  Patient states that he was attempting to wiggle a wooden handle when he slipped and stabbed himself in the left hand.  Patient denies any numbness or tingling.  He is unsure of his last tetanus vaccination.  He denies any weakness.  HPI     Past Medical History:  Diagnosis Date  . Depression   . HSV-2 (herpes simplex virus 2) infection   . Hypertension   . Sleep disorder, shift work     Patient Active Problem List   Diagnosis Date Noted  . HSV-2 (herpes simplex virus 2) infection   . Sleep disorder, shift work     Past Surgical History:  Procedure Laterality Date  . VASECTOMY         Family History  Problem Relation Age of Onset  . Diabetes Father   . Arthritis Father        gout  . Mental retardation Sister     Social History   Tobacco Use  . Smoking status: Former Smoker    Types: Cigars  . Smokeless tobacco: Never Used  . Tobacco comment: hx occasional cigars  Substance Use Topics  . Alcohol use: Yes  . Drug use: No    Home Medications Prior to Admission medications   Medication Sig Start Date End Date Taking? Authorizing Provider  acyclovir (ZOVIRAX) 400 MG tablet Please specify directions, refills and quantity 04/27/19   [name], MD  amLODipine (NORVASC) 2.5 MG tablet Take 1 tablet (2.5 mg total) by mouth daily. 09/13/19   [name], MD  DULoxetine (CYMBALTA) 60 MG capsule Take 1 capsule (60 mg total) by mouth daily. 09/13/19   [name], MD  fluticasone (FLONASE) 50 MCG/ACT nasal spray Place 2 sprays into both nostrils daily. 01/13/17   [name], MD  valACYclovir (VALTREX) 500 MG tablet Take 1 tablet (500 mg total) by mouth daily.  05/04/19   [name], MD  zolpidem (AMBIEN) 10 MG tablet Take 0.5-1 tablets (5-10 mg total) by mouth at bedtime as needed for sleep. 09/13/19 02/05/20  [name], MD    Allergies    Prilosec [omeprazole magnesium]  Review of Systems   Review of Systems  Skin: Positive for wound.  Neurological: Negative for weakness and numbness.  Hematological: Does not bruise/bleed easily.    Physical Exam Updated Vital Signs BP (!) 187/112 (BP Location: Right Arm)   Pulse 69   Temp 98.2 F (36.8 C) (Oral)   Resp 18   Ht 6' 3"  (1.905 m)   Wt 108.9 kg   SpO2 99%   BMI 30.00 kg/m   Physical Exam Vitals and nursing note reviewed.  Constitutional:      General: He is not in acute distress.    Appearance: He is well-developed. He is not diaphoretic.  HENT:     Head: Normocephalic and atraumatic.  Eyes:     General: No scleral icterus.    Conjunctiva/sclera: Conjunctivae normal.  Cardiovascular:     Rate and Rhythm: Normal rate and regular rhythm.     Heart sounds: Normal heart sounds.  Pulmonary:     Effort: Pulmonary effort is normal. No respiratory distress.  Breath sounds: Normal breath sounds.  Abdominal:     Palpations: Abdomen is soft.     Tenderness: There is no abdominal tenderness.  Musculoskeletal:     Cervical back: Normal range of motion and neck supple.     Comments: 4 cm laceration of the left thenar eminence.  Full range of motion of the fingers including opposition of the thumb.  Normal strength.  Neurovascularly intact  Skin:    General: Skin is warm and dry.  Neurological:     Mental Status: He is alert.  Psychiatric:        Behavior: Behavior normal.     ED Results / Procedures / Treatments   Labs (all labs ordered are listed, but only abnormal results are displayed) Labs Reviewed - No data to display  EKG None  Radiology No results found.  Procedures .Marland KitchenLaceration Repair  Date/Time: 12/03/2019 11:37 PM Performed by: Margarita Mail, PA-C Authorized by: Margarita Mail, PA-C   Consent:    Consent obtained:  Verbal   Consent given by:  Patient   Risks discussed:  Infection, need for additional repair, pain, poor cosmetic result and poor wound healing   Alternatives discussed:  No treatment and delayed treatment Universal protocol:    Procedure explained and questions answered to patient or proxy's satisfaction: yes     Relevant documents present and verified: yes     Test results available and properly labeled: yes     Imaging studies available: yes     Required blood products, implants, devices, and special equipment available: yes     Site/side marked: yes     Immediately prior to procedure, a time out was called: yes     Patient identity confirmed:  Verbally with patient Anesthesia (see MAR for exact dosages):    Anesthesia method:  Local infiltration   Local anesthetic:  Lidocaine 2% w/o epi Laceration details:    Location:  Hand   Hand location:  L palm   Length (cm):  4   Depth (mm):  20 Repair type:    Repair type:  Intermediate Pre-procedure details:    Preparation:  Patient was prepped and draped in usual sterile fashion Exploration:    Wound exploration: wound explored through full range of motion and entire depth of wound probed and visualized     Wound extent: muscle damage     Contaminated: no   Treatment:    Area cleansed with:  Betadine   Amount of cleaning:  Standard   Irrigation solution:  Sterile saline   Irrigation method:  Syringe Subcutaneous repair:    Suture size:  3-0   Suture material:  Vicryl   Suture technique:  Running   Number of sutures:  4 Skin repair:    Repair method:  Sutures   Suture size:  4-0   Suture material:  Prolene   Suture technique:  Running locked   Number of sutures:  7 Post-procedure details:    Dressing:  Non-adherent dressing   Patient tolerance of procedure:  Tolerated well, no immediate complications   (including critical care  time)  Medications Ordered in ED Medications  Tdap (BOOSTRIX) injection 0.5 mL (0.5 mLs Intramuscular Given 12/03/19 2245)  lidocaine (XYLOCAINE) 2 % (with pres) injection 200 mg (200 mg Intradermal Given by Other 12/03/19 2245)    ED Course  I have reviewed the triage vital signs and the nursing notes.  Pertinent labs & imaging results that were available during my care of the  patient were reviewed by me and considered in my medical decision making (see chart for details).    MDM Rules/Calculators/A&P                      Ethan Knox is a 49 y.o. male who presents to ED for laceration of L hand. Wound thoroughly cleaned in ED today. Wound explored and bottom of wound seen in a bloodless field. Laceration repaired as dictated above. Patient counseled on home wound care. Follow up with PCP/urgent care or return to ER for suture removal in 7 days.  No evidence of weakness, tendon injury or neurovascular injury on examination. patient was urged to return to the Emergency Department for worsening pain, swelling, expanding erythema especially if it streaks away from the affected area, fever, or for any additional concerns. Patient verbalized understanding. All questions answered.  Final Clinical Impression(s) / ED Diagnoses Final diagnoses:  Laceration of left hand without foreign body, initial encounter    Rx / DC Orders ED Discharge Orders    None       Margarita Mail, PA-C 12/03/19 2341    Virgel Manifold, MD 12/08/19 [phone]

## 2019-12-08 ENCOUNTER — Encounter: Payer: Self-pay | Admitting: Family Medicine

## 2019-12-10 ENCOUNTER — Other Ambulatory Visit: Payer: Self-pay

## 2019-12-10 DIAGNOSIS — R011 Cardiac murmur, unspecified: Secondary | ICD-10-CM

## 2019-12-14 ENCOUNTER — Other Ambulatory Visit: Payer: Self-pay

## 2019-12-14 ENCOUNTER — Encounter: Payer: Self-pay | Admitting: Adult Health Nurse Practitioner

## 2019-12-14 ENCOUNTER — Ambulatory Visit: Payer: 59 | Admitting: Adult Health Nurse Practitioner

## 2019-12-14 VITALS — BP 135/75 | HR 69 | Temp 97.8°F | Ht 75.0 in | Wt 259.2 lb

## 2019-12-14 DIAGNOSIS — I10 Essential (primary) hypertension: Secondary | ICD-10-CM

## 2019-12-14 DIAGNOSIS — Z4802 Encounter for removal of sutures: Secondary | ICD-10-CM

## 2019-12-14 MED ORDER — AMLODIPINE BESYLATE 10 MG PO TABS: ORAL_TABLET | ORAL | 3 refills | Status: DC

## 2019-12-14 NOTE — Progress Notes (Signed)
 .   Subjective:   Patient is here for suture removal s/p laceration to right hand >7 days ago.     Ethan Knox is here for follow-up of his hypertension. His high blood pressure was first noted at last visit here a few months ago. Home blood pressure readings: range 160s/80s to 135/80. Salt intake and diet: salt not added to cooking. . Associated signs and symptoms: none. Patient denies: chest pain, dyspnea and headache. Use of agents associated with hypertension: none. Medication compliance: taking as prescribed. Strong family hx of CAD.   The following portions of the patient's history were reviewed and updated as appropriate: allergies, past medical history, past social history and past surgical history.   Review of Systems Cardiovascular ROS: no chest pain or dyspnea on exertion    Objective:    Physical Exam: BP 135/75 (BP Location: Left Arm, Patient Position: Sitting, Cuff Size: Large)   Pulse 69   Temp 97.8 F (36.6 C) (Temporal)   Ht 6' 3"  (1.905 m)   Wt 259 lb 3.2 oz (117.6 kg)   SpO2 96%   BMI 32.40 kg/m    CVS exam: normal rate, regular rhythm, normal S1, S2, no murmurs, rubs, clicks or gallops.  Patient presents for suture removal. The wound is well healed without signs of infection.  The sutures are removed. Wound care and activity instructions given. Return prn.   Lab Review  Last labs were not fasting labs.       Assessment:    Hypertension, stage 1 . Evidence of target organ damage: none.   Suture Removal:  Sutures removed without evidence of infection or bleeding.    Plan:    Medication: increase to 79m daily and increase to 10 if remains > 140/80.   Will check fasting labs when he returns in 1 month.

## 2019-12-14 NOTE — Patient Instructions (Addendum)
Medications discussed today:  Lisinopril-HCTZ 20/25       Hypertension, Adult High blood pressure (hypertension) is when the force of blood pumping through the arteries is too strong. The arteries are the blood vessels that carry blood from the heart throughout the body. Hypertension forces the heart to work harder to pump blood and may cause arteries to become narrow or stiff. Untreated or uncontrolled hypertension can cause a heart attack, heart failure, a stroke, kidney disease, and other problems. A blood pressure reading consists of a higher number over a lower number. Ideally, your blood pressure should be below 120/80. The first ("top") number is called the systolic pressure. It is a measure of the pressure in your arteries as your heart beats. The second ("bottom") number is called the diastolic pressure. It is a measure of the pressure in your arteries as the heart relaxes. What are the causes? The exact cause of this condition is not known. There are some conditions that result in or are related to high blood pressure. What increases the risk? Some risk factors for high blood pressure are under your control. The following factors may make you more likely to develop this condition:  Smoking.  Having type 2 diabetes mellitus, high cholesterol, or both.  Not getting enough exercise or physical activity.  Being overweight.  Having too much fat, sugar, calories, or salt (sodium) in your diet.  Drinking too much alcohol. Some risk factors for high blood pressure may be difficult or impossible to change. Some of these factors include:  Having chronic kidney disease.  Having a family history of high blood pressure.  Age. Risk increases with age.  Race. You may be at higher risk if you are African American.  Gender. Men are at higher risk than women before age 41. After age 5, women are at higher risk than men.  Having obstructive sleep apnea.  Stress. What are the signs or  symptoms? High blood pressure may not cause symptoms. Very high blood pressure (hypertensive crisis) may cause:  Headache.  Anxiety.  Shortness of breath.  Nosebleed.  Nausea and vomiting.  Vision changes.  Severe chest pain.  Seizures. How is this diagnosed? This condition is diagnosed by measuring your blood pressure while you are seated, with your arm resting on a flat surface, your legs uncrossed, and your feet flat on the floor. The cuff of the blood pressure monitor will be placed directly against the skin of your upper arm at the level of your heart. It should be measured at least twice using the same arm. Certain conditions can cause a difference in blood pressure between your right and left arms. Certain factors can cause blood pressure readings to be lower or higher than normal for a short period of time:  When your blood pressure is higher when you are in a health care provider's office than when you are at home, this is called white coat hypertension. Most people with this condition do not need medicines.  When your blood pressure is higher at home than when you are in a health care provider's office, this is called masked hypertension. Most people with this condition may need medicines to control blood pressure. If you have a high blood pressure reading during one visit or you have normal blood pressure with other risk factors, you may be asked to:  Return on a different day to have your blood pressure checked again.  Monitor your blood pressure at home for 1 week or longer.  If you are diagnosed with hypertension, you may have other blood or imaging tests to help your health care provider understand your overall risk for other conditions. How is this treated? This condition is treated by making healthy lifestyle changes, such as eating healthy foods, exercising more, and reducing your alcohol intake. Your health care provider may prescribe medicine if lifestyle changes  are not enough to get your blood pressure under control, and if:  Your systolic blood pressure is above 130.  Your diastolic blood pressure is above 80. Your personal target blood pressure may vary depending on your medical conditions, your age, and other factors. Follow these instructions at home: Eating and drinking   Eat a diet that is high in fiber and potassium, and low in sodium, added sugar, and fat. An example eating plan is called the DASH (Dietary Approaches to Stop Hypertension) diet. To eat this way: ? Eat plenty of fresh fruits and vegetables. Try to fill one half of your plate at each meal with fruits and vegetables. ? Eat whole grains, such as whole-wheat pasta, brown rice, or whole-grain bread. Fill about one fourth of your plate with whole grains. ? Eat or drink low-fat dairy products, such as skim milk or low-fat yogurt. ? Avoid fatty cuts of meat, processed or cured meats, and poultry with skin. Fill about one fourth of your plate with lean proteins, such as fish, chicken without skin, beans, eggs, or tofu. ? Avoid pre-made and processed foods. These tend to be higher in sodium, added sugar, and fat.  Reduce your daily sodium intake. Most people with hypertension should eat less than 1,500 mg of sodium a day.  Do not drink alcohol if: ? Your health care provider tells you not to drink. ? You are pregnant, may be pregnant, or are planning to become pregnant.  If you drink alcohol: ? Limit how much you use to:  0-1 drink a day for women.  0-2 drinks a day for men. ? Be aware of how much alcohol is in your drink. In the [place] one drink equals one 12 oz bottle of beer (355 mL), one 5 oz glass of wine (148 mL), or one 1 oz glass of hard liquor (44 mL). Lifestyle   Work with your health care provider to maintain a healthy body weight or to lose weight. Ask what an ideal weight is for you.  Get at least 30 minutes of exercise most days of the week. Activities may  include walking, swimming, or biking.  Include exercise to strengthen your muscles (resistance exercise), such as Pilates or lifting weights, as part of your weekly exercise routine. Try to do these types of exercises for 30 minutes at least 3 days a week.  Do not use any products that contain nicotine or tobacco, such as cigarettes, e-cigarettes, and chewing tobacco. If you need help quitting, ask your health care provider.  Monitor your blood pressure at home as told by your health care provider.  Keep all follow-up visits as told by your health care provider. This is important. Medicines  Take over-the-counter and prescription medicines only as told by your health care provider. Follow directions carefully. Blood pressure medicines must be taken as prescribed.  Do not skip doses of blood pressure medicine. Doing this puts you at risk for problems and can make the medicine less effective.  Ask your health care provider about side effects or reactions to medicines that you should watch for. Contact a health care provider if  you:  Think you are having a reaction to a medicine you are taking.  Have headaches that keep coming back (recurring).  Feel dizzy.  Have swelling in your ankles.  Have trouble with your vision. Get help right away if you:  Develop a severe headache or confusion.  Have unusual weakness or numbness.  Feel faint.  Have severe pain in your chest or abdomen.  Vomit repeatedly.  Have trouble breathing. Summary  Hypertension is when the force of blood pumping through your arteries is too strong. If this condition is not controlled, it may put you at risk for serious complications.  Your personal target blood pressure may vary depending on your medical conditions, your age, and other factors. For most people, a normal blood pressure is less than 120/80.  Hypertension is treated with lifestyle changes, medicines, or a combination of both. Lifestyle changes  include losing weight, eating a healthy, low-sodium diet, exercising more, and limiting alcohol. This information is not intended to replace advice given to you by your health care provider. Make sure you discuss any questions you have with your health care provider. Document Revised: 07/08/2018 Document Reviewed: 07/08/2018 Elsevier Patient Education  2020 [employer].

## 2019-12-26 NOTE — Progress Notes (Signed)
Cardiology Office Note:    Date:  12/27/2019   ID:  Ethan Knox, DOB 11-06-1971, MRN [id number]  PCP:  [name], MD  Cardiologist:  No primary care provider on file.  Electrophysiologist:  None   Referring MD: [name], MD   Chief Complaint  Patient presents with  . Heart Murmur    History of Present Illness:    Ethan Knox is a 49 y.o. male with a hx of hypertension who is referred by Dr. Carlota Raspberry for evaluation of heart murmur.  He reports that he walks daily, for at least 1 to 2.5 miles (45 minutes to 1 hour).  He denies any exertional symptoms, denies any dyspnea or chest pain.  Denies any lightheadedness, syncope, or palpitations.  Has noticed some mild lower extremity edema recently since he has been titrating up his amlodipine.  Started on amlodipine for hypertension in November, increased to 10 mg 2-3 days ago.  Smokes 1 to 2 cigars twice weekly.  No heart disease in immediate family.    Past Medical History:  Diagnosis Date  . Depression   . HSV-2 (herpes simplex virus 2) infection   . Hypertension   . Sleep disorder, shift work     Past Surgical History:  Procedure Laterality Date  . VASECTOMY      Current Medications: Current Meds  Medication Sig  . amLODipine (NORVASC) 10 MG tablet 1/2 tab daily, if BP remains >145 or higher after 7 days, increase to 1 tab  . DULoxetine (CYMBALTA) 60 MG capsule Take 1 capsule (60 mg total) by mouth daily.  . valACYclovir (VALTREX) 500 MG tablet Take 1 tablet (500 mg total) by mouth daily.  Marland Kitchen zolpidem (AMBIEN) 10 MG tablet Take 0.5-1 tablets (5-10 mg total) by mouth at bedtime as needed for sleep.     Allergies:   Prilosec [omeprazole magnesium]   Social History   Socioeconomic History  . Marital status: Married    Spouse name: [name]  . Number of children: 1  . Years of education: 98  . Highest education level: Not on file  Occupational History  . Occupation: Engineer, structural    Tobacco Use  . Smoking status: Former Smoker    Types: Cigars  . Smokeless tobacco: Never Used  . Tobacco comment: hx occasional cigars  Substance and Sexual Activity  . Alcohol use: Yes  . Drug use: No  . Sexual activity: Yes  Other Topics Concern  . Not on file  Social History Narrative   Lives with his wife.  His 66 year old son lives in [place].   Social Determinants of Health   Financial Resource Strain:   . Difficulty of Paying Living Expenses: Not on file  Food Insecurity:   . Worried About Charity fundraiser in the Last Year: Not on file  . Ran Out of Food in the Last Year: Not on file  Transportation Needs:   . Lack of Transportation (Medical): Not on file  . Lack of Transportation (Non-Medical): Not on file  Physical Activity:   . Days of Exercise per Week: Not on file  . Minutes of Exercise per Session: Not on file  Stress:   . Feeling of Stress : Not on file  Social Connections:   . Frequency of Communication with Friends and Family: Not on file  . Frequency of Social Gatherings with Friends and Family: Not on file  . Attends Religious Services: Not on file  . Active  Member of Clubs or Organizations: Not on file  . Attends Archivist Meetings: Not on file  . Marital Status: Not on file     Family History: The patient's family history includes Arthritis in his father; Diabetes in his father; Mental retardation in his sister.  ROS:   Please see the history of present illness.     All other systems reviewed and are negative.  EKGs/Labs/Other Studies Reviewed:    The following studies were reviewed today:   EKG:  EKG is  ordered today.  The ekg ordered today demonstrates normal sinus rhythm, rate 67, no ST/T abnormalities  Recent Labs: 09/13/2019: ALT 54; BUN 17; Creatinine, Ser 0.86; Potassium 4.2; Sodium 138; TSH 2.960  Recent Lipid Panel    Component Value Date/Time   CHOL 195 09/13/2019 1227   TRIG 269 (H) 09/13/2019 1227   HDL 42  09/13/2019 1227   CHOLHDL 4.6 09/13/2019 1227   CHOLHDL 4.5 01/17/2015 0929   VLDL 40 01/17/2015 0929   LDLCALC 107 (H) 09/13/2019 1227    Physical Exam:    VS:  BP 140/80   Pulse 68   Ht 6' 3"  (1.905 m)   Wt 257 lb 3.2 oz (116.7 kg)   SpO2 98%   BMI 32.15 kg/m     Wt Readings from Last 3 Encounters:  12/27/19 257 lb 3.2 oz (116.7 kg)  12/14/19 259 lb 3.2 oz (117.6 kg)  12/03/19 240 lb (108.9 kg)     GEN:  Well nourished, well developed in no acute distress HEENT: Normal NECK: No JVD; No carotid bruits LYMPHATICS: No lymphadenopathy CARDIAC: RRR, 1/6 systolic murmur RESPIRATORY:  Clear to auscultation without rales, wheezing or rhonchi  ABDOMEN: Soft, non-tender, non-distended MUSCULOSKELETAL:  No edema; No deformity  SKIN: Warm and dry NEUROLOGIC:  Alert and oriented x 3 PSYCHIATRIC:  Normal affect   ASSESSMENT:    1. Heart murmur   2. Essential hypertension    PLAN:    In order of problems listed above:  Heart murmur: Suspect benign flow murmur.  Will check TTE for further evaluation  Hypertension: On amlodipine 10 mg daily.  BP mildly elevated in clinic today, but just increased dose of amlodipine 2 to 3 days ago.  Will continue to monitor  RTC in 1 month  Medication Adjustments/Labs and Tests Ordered: Current medicines are reviewed at length with the patient today.  Concerns regarding medicines are outlined above.  Orders Placed This Encounter  Procedures  . ECHOCARDIOGRAM COMPLETE   No orders of the defined types were placed in this encounter.   Patient Instructions  Medication Instructions:  Your physician recommends that you continue on your current medications as directed. Please refer to the Current Medication list given to you today.  Lab Work: NONE  Testing/Procedures: Your physician has requested that you have an echocardiogram. Echocardiography is a painless test that uses sound waves to create images of your heart. It provides your  doctor with information about the size and shape of your heart and how well your heart's chambers and valves are working. This procedure takes approximately one hour. There are no restrictions for this procedure.  This will be done at our [facility] location:  [place]: At [facility], you and your health needs are our priority.  As part of our continuing mission to provide you with exceptional heart care, we have created designated Provider Care Teams.  These Care Teams include your primary Cardiologist (  physician) and Advanced Practice Providers (APPs -  Physician Assistants and Nurse Practitioners) who all work together to provide you with the care you need, when you need it.  Your next appointment:   1 month(s)  The format for your next appointment:   In Person  Provider:   Oswaldo Milian, MD      Signed, Donato Heinz, MD  12/27/2019 6:27 PM    [place]

## 2019-12-27 ENCOUNTER — Other Ambulatory Visit: Payer: Self-pay

## 2019-12-27 ENCOUNTER — Encounter: Payer: Self-pay | Admitting: Cardiology

## 2019-12-27 ENCOUNTER — Ambulatory Visit: Payer: 59 | Admitting: Cardiology

## 2019-12-27 VITALS — BP 140/80 | HR 68 | Ht 75.0 in | Wt 257.2 lb

## 2019-12-27 DIAGNOSIS — I10 Essential (primary) hypertension: Secondary | ICD-10-CM | POA: Diagnosis not present

## 2019-12-27 DIAGNOSIS — R011 Cardiac murmur, unspecified: Secondary | ICD-10-CM

## 2019-12-27 NOTE — Patient Instructions (Signed)
Medication Instructions:  Your physician recommends that you continue on your current medications as directed. Please refer to the Current Medication list given to you today.  Lab Work: NONE  Testing/Procedures: Your physician has requested that you have an echocardiogram. Echocardiography is a painless test that uses sound waves to create images of your heart. It provides your doctor with information about the size and shape of your heart and how well your heart's chambers and valves are working. This procedure takes approximately one hour. There are no restrictions for this procedure.  This will be done at our [facility] location:  [place]: At [facility], you and your health needs are our priority.  As part of our continuing mission to provide you with exceptional heart care, we have created designated Provider Care Teams.  These Care Teams include your primary Cardiologist (physician) and Advanced Practice Providers (APPs -  Physician Assistants and Nurse Practitioners) who all work together to provide you with the care you need, when you need it.  Your next appointment:   1 month(s)  The format for your next appointment:   In Person  Provider:   Oswaldo Milian, MD

## 2020-01-10 ENCOUNTER — Ambulatory Visit (HOSPITAL_COMMUNITY): Payer: 59 | Attending: Cardiology

## 2020-01-10 ENCOUNTER — Other Ambulatory Visit: Payer: Self-pay

## 2020-01-10 DIAGNOSIS — R011 Cardiac murmur, unspecified: Secondary | ICD-10-CM | POA: Diagnosis not present

## 2020-01-11 ENCOUNTER — Encounter: Payer: Self-pay | Admitting: Adult Health Nurse Practitioner

## 2020-01-11 ENCOUNTER — Ambulatory Visit: Payer: 59 | Admitting: Adult Health Nurse Practitioner

## 2020-01-11 VITALS — BP 143/86 | HR 65 | Temp 97.8°F | Ht 75.0 in | Wt 253.0 lb

## 2020-01-11 DIAGNOSIS — R011 Cardiac murmur, unspecified: Secondary | ICD-10-CM

## 2020-01-11 DIAGNOSIS — I10 Essential (primary) hypertension: Secondary | ICD-10-CM | POA: Diagnosis not present

## 2020-01-11 MED ORDER — LISINOPRIL-HYDROCHLOROTHIAZIDE 20-12.5 MG PO TABS: 1.0000 | ORAL_TABLET | Freq: Every day | ORAL | 1 refills | Status: DC

## 2020-01-11 NOTE — Progress Notes (Signed)
   01/11/2020  KASPIAN MUCCIO 1971-02-28 [id number]       Subjective:    Ethan Knox is here for follow-up of his hypertension. His high blood pressure was first noted over the past few months. Home blood pressure readings: have been high.  He is not sure if his BP machine is calibrated . Salt intake and diet: salt not added to cooking. . Associated signs and symptoms: none. Patient denies: blurred vision, chest pain, paroxysmal nocturnal dyspnea and peripheral edema. Use of agents associated with hypertension: none. Medication compliance: taking as prescribed.  Per last visit, patient went up on Amlodipine to 33m daily.  His BP machine at home is registering high.  He is a pEngineer, structural  Works swing shifts.   He did see the cardiologist for his heart murmur which is thought to be a benign flow murmur. TEE results pending. EKG in the office visit was normal and reassuring.   The following portions of the patient's history were reviewed and updated as appropriate: allergies, current medications, past family history, past medical history, past social history, past surgical history and problem list.   Review of Systems Constitutional: positive for night sweats, negative for chills, fatigue and malaise Respiratory: negative for cough, dyspnea on exertion and wheezing Cardiovascular: negative for chest pain and exertional chest pressure/discomfort Musculoskeletal:negative for muscle weakness Behavioral/Psych: negative for behavior problems, irritability and sleep disturbance     Objective:    Physical Exam: BP (!) 143/86 (BP Location: Right Arm, Patient Position: Sitting, Cuff Size: Large)   Pulse 65   Temp 97.8 F (36.6 C) (Temporal)   Ht 6' 3"  (1.905 m)   Wt 253 lb (114.8 kg)   SpO2 95%   BMI 31.62 kg/m  General appearance: alert and appears stated age Neck: no adenopathy, no carotid bruit, no JVD, supple, symmetrical, trachea midline and thyroid not enlarged,  symmetric, no tenderness/mass/nodules Lungs: clear to auscultation bilaterally Heart: regular rate and rhythm, S1, S2 normal, no murmur, click, rub or gallop Extremities: extremities normal, atraumatic, no cyanosis or edema  Lab Review  not applicable     Assessment:    Hypertension, stage 1  Plan:    Medication: discontinue Amlodipine and begin Lisinopril/HCTZ 20/249m Regular aerobic exercise. Check blood pressures 2 times weekly and record. Follow up: 4 weeks and as needed. He will stop by [facility] check BPs    Orders Placed This Encounter  Procedures  . CMP14+EGFR  . Lipid Panel   Meds ordered this encounter  Medications  . lisinopril-hydrochlorothiazide (ZESTORETIC) 20-12.5 MG tablet    Sig: Take 1 tablet by mouth daily.    Dispense:  30 tablet    Refill:  1    SaGlyn AdeNP

## 2020-01-11 NOTE — Patient Instructions (Addendum)
Managing Your Hypertension Hypertension is commonly called high blood pressure. This is when the force of your blood pressing against the walls of your arteries is too strong. Arteries are blood vessels that carry blood from your heart throughout your body. Hypertension forces the heart to work harder to pump blood, and may cause the arteries to become narrow or stiff. Having untreated or uncontrolled hypertension can cause heart attack, stroke, kidney disease, and other problems. What are blood pressure readings? A blood pressure reading consists of a higher number over a lower number. Ideally, your blood pressure should be below 120/80. The first ("top") number is called the systolic pressure. It is a measure of the pressure in your arteries as your heart beats. The second ("bottom") number is called the diastolic pressure. It is a measure of the pressure in your arteries as the heart relaxes. What does my blood pressure reading mean? Blood pressure is classified into four stages. Based on your blood pressure reading, your health care provider may use the following stages to determine what type of treatment you need, if any. Systolic pressure and diastolic pressure are measured in a unit called mm Hg. Normal  Systolic pressure: below 831.  Diastolic pressure: below 80. Elevated  Systolic pressure: 517-616.  Diastolic pressure: below 80. Hypertension stage 1  Systolic pressure: 073-710.  Diastolic pressure: 62-69. Hypertension stage 2  Systolic pressure: 485 or above.  Diastolic pressure: 90 or above. What health risks are associated with hypertension? Managing your hypertension is an important responsibility. Uncontrolled hypertension can lead to:  A heart attack.  A stroke.  A weakened blood vessel (aneurysm).  Heart failure.  Kidney damage.  Eye damage.  Metabolic syndrome.  Memory and concentration problems. What changes can I make to manage my  hypertension? Hypertension can be managed by making lifestyle changes and possibly by taking medicines. Your health care provider will help you make a plan to bring your blood pressure within a normal range. Eating and drinking   Eat a diet that is high in fiber and potassium, and low in salt (sodium), added sugar, and fat. An example eating plan is called the DASH (Dietary Approaches to Stop Hypertension) diet. To eat this way: ? Eat plenty of fresh fruits and vegetables. Try to fill half of your plate at each meal with fruits and vegetables. ? Eat whole grains, such as whole wheat pasta, brown rice, or whole grain bread. Fill about one quarter of your plate with whole grains. ? Eat low-fat diary products. ? Avoid fatty cuts of meat, processed or cured meats, and poultry with skin. Fill about one quarter of your plate with lean proteins such as fish, chicken without skin, beans, eggs, and tofu. ? Avoid premade and processed foods. These tend to be higher in sodium, added sugar, and fat.  Reduce your daily sodium intake. Most people with hypertension should eat less than 1,500 mg of sodium a day.  Limit alcohol intake to no more than 1 drink a day for nonpregnant women and 2 drinks a day for men. One drink equals 12 oz of beer, 5 oz of wine, or 1 oz of hard liquor. Lifestyle  Work with your health care provider to maintain a healthy body weight, or to lose weight. Ask what an ideal weight is for you.  Get at least 30 minutes of exercise that causes your heart to beat faster (aerobic exercise) most days of the week. Activities may include walking, swimming, or biking.  Include  exercise to strengthen your muscles (resistance exercise), such as weight lifting, as part of your weekly exercise routine. Try to do these types of exercises for 30 minutes at least 3 days a week.  Do not use any products that contain nicotine or tobacco, such as cigarettes and e-cigarettes. If you need help quitting,  ask your health care provider.  Control any long-term (chronic) conditions you have, such as high cholesterol or diabetes. Monitoring  Monitor your blood pressure at home as told by your health care provider. Your personal target blood pressure may vary depending on your medical conditions, your age, and other factors.  Have your blood pressure checked regularly, as often as told by your health care provider. Working with your health care provider  Review all the medicines you take with your health care provider because there may be side effects or interactions.  Talk with your health care provider about your diet, exercise habits, and other lifestyle factors that may be contributing to hypertension.  Visit your health care provider regularly. Your health care provider can help you create and adjust your plan for managing hypertension. Will I need medicine to control my blood pressure? Your health care provider may prescribe medicine if lifestyle changes are not enough to get your blood pressure under control, and if:  Your systolic blood pressure is 130 or higher.  Your diastolic blood pressure is 80 or higher. Take medicines only as told by your health care provider. Follow the directions carefully. Blood pressure medicines must be taken as prescribed. The medicine does not work as well when you skip doses. Skipping doses also puts you at risk for problems. Contact a health care provider if:  You think you are having a reaction to medicines you have taken.  You have repeated (recurrent) headaches.  You feel dizzy.  You have swelling in your ankles.  You have trouble with your vision. Get help right away if:  You develop a severe headache or confusion.  You have unusual weakness or numbness, or you feel faint.  You have severe pain in your chest or abdomen.  You vomit repeatedly.  You have trouble breathing. Summary  Hypertension is when the force of blood pumping  through your arteries is too strong. If this condition is not controlled, it may put you at risk for serious complications.  Your personal target blood pressure may vary depending on your medical conditions, your age, and other factors. For most people, a normal blood pressure is less than 120/80.  Hypertension is managed by lifestyle changes, medicines, or both. Lifestyle changes include weight loss, eating a healthy, low-sodium diet, exercising more, and limiting alcohol. This information is not intended to replace advice given to you by your health care provider. Make sure you discuss any questions you have with your health care provider. Document Revised: 02/19/2019 Document Reviewed: 09/25/2016 Elsevier Patient Education  [place].

## 2020-01-12 LAB — LIPID PANEL
Chol/HDL Ratio: 4.3 ratio (ref 0.0–5.0)
Cholesterol, Total: 200 mg/dL — ABNORMAL HIGH (ref 100–199)
HDL: 46 mg/dL (ref >39)
LDL Chol Calc (NIH): 126 mg/dL — ABNORMAL HIGH (ref 0–99)
Triglycerides: 158 mg/dL — ABNORMAL HIGH (ref 0–149)
VLDL Cholesterol Cal: 28 mg/dL (ref 5–40)

## 2020-01-12 LAB — CMP14+EGFR
ALT: 61 IU/L — ABNORMAL HIGH (ref 0–44)
AST: 35 IU/L (ref 0–40)
Albumin/Globulin Ratio: 1.7 (ref 1.2–2.2)
Albumin: 4.7 g/dL (ref 4.0–5.0)
Alkaline Phosphatase: 88 IU/L (ref 39–117)
BUN/Creatinine Ratio: 17 (ref 9–20)
BUN: 18 mg/dL (ref 6–24)
Bilirubin Total: 0.6 mg/dL (ref 0.0–1.2)
CO2: 20 mmol/L (ref 20–29)
Calcium: 9.9 mg/dL (ref 8.7–10.2)
Chloride: 103 mmol/L (ref 96–106)
Creatinine, Ser: 1.08 mg/dL (ref 0.76–1.27)
GFR calc Af Amer: 95 mL/min/{1.73_m2} (ref >59)
GFR calc non Af Amer: 82 mL/min/{1.73_m2} (ref >59)
Globulin, Total: 2.8 g/dL (ref 1.5–4.5)
Glucose: 116 mg/dL — ABNORMAL HIGH (ref 65–99)
Potassium: 4.3 mmol/L (ref 3.5–5.2)
Sodium: 139 mmol/L (ref 134–144)
Total Protein: 7.5 g/dL (ref 6.0–8.5)

## 2020-01-31 NOTE — Progress Notes (Signed)
Cardiology Office Note:    Date:  02/02/2020   ID:  Ethan Knox, DOB Apr 05, 1971, MRN [id number]  PCP:  [name], MD  Cardiologist:  No primary care provider on file.  Electrophysiologist:  None   Referring MD: [name], MD   No chief complaint on file.   History of Present Illness:    Ethan Knox is a 49 y.o. male with a hx of hypertension who presents for follow-up.  He was referred by Dr. Carlota Raspberry for evaluation of heart murmur.  TTE was done on 01/10/2020, which showed LVEF 65 to 70%, normal RV function, no significant valvular disease.  Since last clinic visit, he reports that he has been doing well.  He was switched from amlodipine to lisinopril-hydrochlorothiazide, with improvement in his blood pressure.  He denies any chest pain or dyspnea.  Continues to walk 1 mile per day and has also started biking now that the weather is better.  Bikes about once per week, for ~ 6 miles.  Denies any exertional symptoms.  He continues to smoke 1 to 2 cigars twice weekly.   Past Medical History:  Diagnosis Date  . Depression   . HSV-2 (herpes simplex virus 2) infection   . Hypertension   . Sleep disorder, shift work     Past Surgical History:  Procedure Laterality Date  . VASECTOMY      Current Medications: Current Meds  Medication Sig  . amLODipine (NORVASC) 10 MG tablet Take 10 mg by mouth daily.  . DULoxetine (CYMBALTA) 60 MG capsule Take 1 capsule (60 mg total) by mouth daily.  . valACYclovir (VALTREX) 500 MG tablet Take 1 tablet (500 mg total) by mouth daily.  Marland Kitchen zolpidem (AMBIEN) 10 MG tablet Take 0.5-1 tablets (5-10 mg total) by mouth at bedtime as needed for sleep.     Allergies:   Prilosec [omeprazole magnesium]   Social History   Socioeconomic History  . Marital status: Married    Spouse name: Ethan Knox  . Number of children: 1  . Years of education: 8  . Highest education level: Not on file  Occupational History  . Occupation:  Engineer, structural  Tobacco Use  . Smoking status: Former Smoker    Types: Cigars  . Smokeless tobacco: Never Used  . Tobacco comment: hx occasional cigars  Substance and Sexual Activity  . Alcohol use: Yes  . Drug use: No  . Sexual activity: Yes  Other Topics Concern  . Not on file  Social History Narrative   Lives with his wife.  His 58 year old son lives in [place].   Social Determinants of Health   Financial Resource Strain:   . Difficulty of Paying Living Expenses:   Food Insecurity:   . Worried About Charity fundraiser in the Last Year:   . Arboriculturist in the Last Year:   Transportation Needs:   . Film/video editor (Medical):   Marland Kitchen Lack of Transportation (Non-Medical):   Physical Activity:   . Days of Exercise per Week:   . Minutes of Exercise per Session:   Stress:   . Feeling of Stress :   Social Connections:   . Frequency of Communication with Friends and Family:   . Frequency of Social Gatherings with Friends and Family:   . Attends Religious Services:   . Active Member of Clubs or Organizations:   . Attends Archivist Meetings:   Marland Kitchen Marital Status:  Family History: The patient's family history includes Arthritis in his father; Diabetes in his father; Mental retardation in his sister.  ROS:   Please see the history of present illness.     All other systems reviewed and are negative.  EKGs/Labs/Other Studies Reviewed:    The following studies were reviewed today:   EKG:  EKG is not ordered today.  The ekg ordered most recently demonstrates normal sinus rhythm, rate 67, no ST/T abnormalities  TTE 01/10/20: 1. Left ventricular ejection fraction, by estimation, is 65 to 70%. The  left ventricle has normal function. The left ventricle has no regional  wall motion abnormalities. There is mild left ventricular hypertrophy.  Left ventricular diastolic parameters  were normal.  2. Right ventricular systolic function is normal. The right  ventricular  size is normal. Tricuspid regurgitation signal is inadequate for assessing  PA pressure.  3. The mitral valve is normal in structure and function. No evidence of  mitral valve regurgitation.  4. The aortic valve is tricuspid. Aortic valve regurgitation is not  visualized. Mild aortic valve sclerosis is present, with no evidence of  aortic valve stenosis.   Recent Labs: 09/13/2019: TSH 2.960 01/11/2020: ALT 61; BUN 18; Creatinine, Ser 1.08; Potassium 4.3; Sodium 139  Recent Lipid Panel    Component Value Date/Time   CHOL 200 (H) 01/11/2020 1222   TRIG 158 (H) 01/11/2020 1222   HDL 46 01/11/2020 1222   CHOLHDL 4.3 01/11/2020 1222   CHOLHDL 4.5 01/17/2015 0929   VLDL 40 01/17/2015 0929   LDLCALC 126 (H) 01/11/2020 1222    Physical Exam:    VS:  BP 110/72   Pulse 65   Ht 6' 3"  (1.905 m)   Wt 258 lb 3.2 oz (117.1 kg)   SpO2 97%   BMI 32.27 kg/m     Wt Readings from Last 3 Encounters:  02/02/20 258 lb 3.2 oz (117.1 kg)  01/11/20 253 lb (114.8 kg)  12/27/19 257 lb 3.2 oz (116.7 kg)     GEN:  Well nourished, well developed in no acute distress HEENT: Normal NECK: No JVD; No carotid bruits LYMPHATICS: No lymphadenopathy CARDIAC: RRR, 1/6 systolic murmur RESPIRATORY:  Clear to auscultation without rales, wheezing or rhonchi  ABDOMEN: Soft, non-tender, non-distended MUSCULOSKELETAL:  No edema; No deformity  SKIN: Warm and dry NEUROLOGIC:  Alert and oriented x 3 PSYCHIATRIC:  Normal affect   ASSESSMENT:    1. Heart murmur   2. Essential hypertension   3. Tobacco use    PLAN:     Heart murmur: Suspect benign flow murmur.  TTE shows no significant valvular disease  Hypertension: Mild LVH on TTE.  Recently switched from amlodipine to lisinopril/hydrochlorothiazide 20/25 mg daily on 01/11/2020.  BP appears controlled in clinic today  Tobacco use: smokes 1 to 2 cigars twice weekly.  Counseled on the risk of tobacco use and cessation strongly encouraged  RTC  in 1 year  Medication Adjustments/Labs and Tests Ordered: Current medicines are reviewed at length with the patient today.  Concerns regarding medicines are outlined above.  No orders of the defined types were placed in this encounter.  No orders of the defined types were placed in this encounter.   Patient Instructions  Medication Instructions:  Your physician recommends that you continue on your current medications as directed. Please refer to the Current Medication list given to you today.  *If you need a refill on your cardiac medications before your next appointment, please call your pharmacy*  Lab Work: NONE  Testing/Procedures: NONE  Follow-Up: At [facility], you and your health needs are our priority.  As part of our continuing mission to provide you with exceptional heart care, we have created designated Provider Care Teams.  These Care Teams include your primary Cardiologist (physician) and Advanced Practice Providers (APPs -  Physician Assistants and Nurse Practitioners) who all work together to provide you with the care you need, when you need it.  We recommend signing up for the patient portal called "MyChart".  Sign up information is provided on this After Visit Summary.  MyChart is used to connect with patients for Virtual Visits (Telemedicine).  Patients are able to view lab/test results, encounter notes, upcoming appointments, etc.  Non-urgent messages can be sent to your provider as well.   To learn more about what you can do with MyChart, go to [email or web address].    Your next appointment:   12 month(s)  The format for your next appointment:   In Person  Provider:   Oswaldo Milian, MD         Signed, Donato Heinz, MD  02/02/2020 1:40 PM    [place]

## 2020-02-02 ENCOUNTER — Encounter: Payer: Self-pay | Admitting: Cardiology

## 2020-02-02 ENCOUNTER — Other Ambulatory Visit: Payer: Self-pay

## 2020-02-02 ENCOUNTER — Ambulatory Visit: Payer: 59 | Admitting: Cardiology

## 2020-02-02 VITALS — BP 110/72 | HR 65 | Ht 75.0 in | Wt 258.2 lb

## 2020-02-02 DIAGNOSIS — Z720 Tobacco use: Secondary | ICD-10-CM | POA: Diagnosis not present

## 2020-02-02 DIAGNOSIS — I10 Essential (primary) hypertension: Secondary | ICD-10-CM

## 2020-02-02 DIAGNOSIS — R011 Cardiac murmur, unspecified: Secondary | ICD-10-CM

## 2020-02-02 NOTE — Patient Instructions (Signed)
Medication Instructions:  Your physician recommends that you continue on your current medications as directed. Please refer to the Current Medication list given to you today.  *If you need a refill on your cardiac medications before your next appointment, please call your pharmacy*   Lab Work: NONE  Testing/Procedures: NONE  Follow-Up: At [facility], you and your health needs are our priority.  As part of our continuing mission to provide you with exceptional heart care, we have created designated Provider Care Teams.  These Care Teams include your primary Cardiologist (physician) and Advanced Practice Providers (APPs -  Physician Assistants and Nurse Practitioners) who all work together to provide you with the care you need, when you need it.  We recommend signing up for the patient portal called "MyChart".  Sign up information is provided on this After Visit Summary.  MyChart is used to connect with patients for Virtual Visits (Telemedicine).  Patients are able to view lab/test results, encounter notes, upcoming appointments, etc.  Non-urgent messages can be sent to your provider as well.   To learn more about what you can do with MyChart, go to [email or web address].    Your next appointment:   12 month(s)  The format for your next appointment:   In Person  Provider:   Oswaldo Milian, MD

## 2020-02-18 ENCOUNTER — Ambulatory Visit: Payer: 59 | Admitting: Family Medicine

## 2020-02-18 ENCOUNTER — Other Ambulatory Visit: Payer: Self-pay

## 2020-02-18 ENCOUNTER — Encounter: Payer: Self-pay | Admitting: Family Medicine

## 2020-02-18 VITALS — BP 120/78 | HR 54 | Temp 97.6°F | Ht 75.0 in | Wt 253.0 lb

## 2020-02-18 DIAGNOSIS — K219 Gastro-esophageal reflux disease without esophagitis: Secondary | ICD-10-CM | POA: Diagnosis not present

## 2020-02-18 DIAGNOSIS — E785 Hyperlipidemia, unspecified: Secondary | ICD-10-CM

## 2020-02-18 DIAGNOSIS — F329 Major depressive disorder, single episode, unspecified: Secondary | ICD-10-CM | POA: Diagnosis not present

## 2020-02-18 DIAGNOSIS — F32A Depression, unspecified: Secondary | ICD-10-CM

## 2020-02-18 DIAGNOSIS — I10 Essential (primary) hypertension: Secondary | ICD-10-CM | POA: Diagnosis not present

## 2020-02-18 DIAGNOSIS — B009 Herpesviral infection, unspecified: Secondary | ICD-10-CM

## 2020-02-18 MED ORDER — VALACYCLOVIR HCL 500 MG PO TABS: 500.0000 mg | ORAL_TABLET | Freq: Every day | ORAL | 3 refills | Status: DC

## 2020-02-18 MED ORDER — DULOXETINE HCL 60 MG PO CPEP: 60.0000 mg | ORAL_CAPSULE | Freq: Every day | ORAL | 2 refills | Status: DC

## 2020-02-18 MED ORDER — LISINOPRIL-HYDROCHLOROTHIAZIDE 20-12.5 MG PO TABS: 1.0000 | ORAL_TABLET | Freq: Every day | ORAL | 1 refills | Status: DC

## 2020-02-18 NOTE — Progress Notes (Signed)
Subjective:  Patient ID: Ethan Knox, male    DOB: May 04, 1971  Age: 49 y.o. MRN: [id number]  CC:  Chief Complaint  Patient presents with  . Hypertension    pt hasn't had any issues with BP since starting medication. Pt hasn't had any side effects from medication . pt states he dosen't check his BP at home any more BC the maching is not working properly.  . Follow-up    pt states he would like to address his resent labs. ptstates they resently showed pre diabetes and possible hyperlipedimia.     HPI Ethan Knox presents for   Hypertension: March 2 visit with Judson Roch, amlodipine was discontinued, started on Lisinopril HCTZ 20/12.5 mg daily.  Evaluated by cardiology March 24 with history of heart murmur.  Transthoracic echo 01/10/2020 EF 65 to 70%, normal RV function, no significant valvular disease.  Suspected benign flow murmur. No new side effects - working better than amlodipine.  Home readings:none.   BP Readings from Last 3 Encounters:  02/18/20 120/78  02/02/20 110/72  01/11/20 (!) 143/86   Lab Results  Component Value Date   CREATININE 1.08 01/11/2020   Depression: Takes Cymbalta 60 mg daily.  Has used Ambien in the past due to insomnia with shiftwork sleep disorder.  Symptoms are improved with different job/position with work when discussed in November. Rare use of Ambien - only every few weeks. Mostly nights now. Some variability in end of shift. Mood doing ok.  Alcohol: - less use - few times per month. Started back with biking, walking daily with 2 dogs.   Depression screen [facility] 2/9 02/18/2020 01/11/2020 12/14/2019 09/13/2019 01/01/2019  Decreased Interest 0 0 0 0 0  Down, Depressed, Hopeless 0 0 0 0 0  PHQ - 2 Score 0 0 0 0 0  Altered sleeping - - - - -  Tired, decreased energy - - - - -  Change in appetite - - - - -  Feeling bad or failure about yourself  - - - - -  Trouble concentrating - - - - -  Moving slowly or fidgety/restless - - - - -  Suicidal  thoughts - - - - -  PHQ-9 Score - - - - -  Difficult doing work/chores - - - - -   Hyperlipidemia: Not currently on medication. Wt Readings from Last 3 Encounters:  02/18/20 253 lb (114.8 kg)  02/02/20 258 lb 3.2 oz (117.1 kg)  01/11/20 253 lb (114.8 kg)    The 10-year ASCVD risk score Mikey Bussing DC Jr., et al., 2013) is: 2.6%   Values used to calculate the score:     Age: 78 years     Sex: Male     Is Non-Hispanic African American: No     Diabetic: No     Tobacco smoker: No     Systolic Blood Pressure: 250 mmHg     Is BP treated: Yes     HDL Cholesterol: 46 mg/dL     Total Cholesterol: 200 mg/dL  Lab Results  Component Value Date   CHOL 200 (H) 01/11/2020   HDL 46 01/11/2020   LDLCALC 126 (H) 01/11/2020   TRIG 158 (H) 01/11/2020   CHOLHDL 4.3 01/11/2020   Lab Results  Component Value Date   ALT 61 (H) 01/11/2020   AST 35 01/11/2020   ALKPHOS 88 01/11/2020   BILITOT 0.6 01/11/2020   Heartburn: Daily for years.  Prior zantac, now on pepcid 2 per day. Rash  with omeprazole.  Has tried to cut back to minimal coffee, avoiding acidic foods.  Cough at times if flair of heartburn.   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  Valtrex 541m qd.  No new side effects.    History Patient Active Problem List   Diagnosis Date Noted  . Heart murmur 01/11/2020  . Essential hypertension 12/14/2019  . Visit for suture removal 12/14/2019  . HSV-2 (herpes simplex virus 2) infection   . Sleep disorder, shift work    Past Medical History:  Diagnosis Date  . Depression   . HSV-2 (herpes simplex virus 2) infection   . Hypertension   . Sleep disorder, shift work    Past Surgical History:  Procedure Laterality Date  . VASECTOMY     Allergies  Allergen Reactions  . Prilosec [Omeprazole Magnesium] Rash   Prior to Admission medications   Medication Sig Start Date End Date Taking? Authorizing Provider  DULoxetine (CYMBALTA) 60 MG capsule Take 1 capsule (60 mg total) by mouth daily.  09/13/19  Yes GWendie Agreste MD  lisinopril-hydrochlorothiazide (ZESTORETIC) 20-12.5 MG tablet Take 1 tablet by mouth daily. 02/07/20  Yes [provider]  valACYclovir (VALTREX) 500 MG tablet Take 1 tablet (500 mg total) by mouth daily. 05/04/19  Yes GWendie Agreste MD  amLODipine (NORVASC) 10 MG tablet Take 10 mg by mouth daily.    [provider]  zolpidem (AMBIEN) 10 MG tablet Take 0.5-1 tablets (5-10 mg total) by mouth at bedtime as needed for sleep. 09/13/19 02/05/20  GWendie Agreste MD   Social History   Socioeconomic History  . Marital status: Married    Spouse name: EGayland Curry . Number of children: 1  . Years of education: 177 . Highest education level: Not on file  Occupational History  . Occupation: PEngineer, structural Tobacco Use  . Smoking status: Former Smoker    Types: Cigars  . Smokeless tobacco: Never Used  . Tobacco comment: hx occasional cigars  Substance and Sexual Activity  . Alcohol use: Yes  . Drug use: No  . Sexual activity: Yes  Other Topics Concern  . Not on file  Social History Narrative   Lives with his wife.  [place] 156year old [place]   Social Determinants of Health   Financial Resource Strain:   . Difficulty of Paying Living Expenses:   Food Insecurity:   . Worried About RCharity fundraiserin the Last Year:   . RArboriculturistin the Last Year:   Transportation Needs:   . LFilm/video editor(Medical):   .Marland KitchenLack of Transportation (Non-Medical):   Physical Activity:   . Days of Exercise per Week:   . Minutes of Exercise per Session:   Stress:   . Feeling of Stress :   Social Connections:   . Frequency of Communication with Friends and Family:   . Frequency of Social Gatherings with Friends and Family:   . Attends Religious Services:   . Active Member of Clubs or Organizations:   . Attends CArchivistMeetings:   .Marland KitchenMarital Status:   Intimate Partner Violence:   . Fear of Current or  Ex-Partner:   . Emotionally Abused:   .Marland KitchenPhysically Abused:   . Sexually Abused:     Review of Systems  Constitutional: Negative for fatigue and unexpected weight change.  Eyes: Negative for visual disturbance.  Respiratory: Negative for cough, chest tightness and shortness of  breath.   Cardiovascular: Negative for chest pain, palpitations and leg swelling.  Gastrointestinal: Negative for abdominal pain and blood in stool.  Neurological: Negative for dizziness, light-headedness and headaches.     Objective:   Vitals:   02/18/20 1426  BP: 120/78  Pulse: (!) 54  Temp: 97.6 F (36.4 C)  TempSrc: Temporal  SpO2: 100%  Weight: 253 lb (114.8 kg)  Height: 6' 3"  (1.905 m)     Physical Exam Vitals reviewed.  Constitutional:      Appearance: He is well-developed.  HENT:     Head: Normocephalic and atraumatic.  Eyes:     Pupils: Pupils are equal, round, and reactive to light.  Neck:     Vascular: No carotid bruit or JVD.  Cardiovascular:     Rate and Rhythm: Normal rate and regular rhythm.     Heart sounds: Normal heart sounds. No murmur.  Pulmonary:     Effort: Pulmonary effort is normal.     Breath sounds: Normal breath sounds. No rales.  Skin:    General: Skin is warm and dry.  Neurological:     Mental Status: He is alert and oriented to person, place, and time.        Assessment & Plan:  Ethan Knox is a 49 y.o. male . Essential hypertension - Plan: lisinopril-hydrochlorothiazide (ZESTORETIC) 20-12.5 MG tablet  -Stable, and tolerating lisinopril HCTZ better.  Continue same  HSV infection - Plan: valACYclovir (VALTREX) 500 MG tablet  -Valtrex for prevention.  No recent flare.  Continue same.  Depression, unspecified depression type - Plan: DULoxetine (CYMBALTA) 60 MG capsule  -Stable, continue Cymbalta 60 mg daily, rare need for Ambien at this time.  Gastroesophageal reflux disease, unspecified whether esophagitis present - Plan: Ambulatory referral  to Gastroenterology  -Intolerant to PPI.  We will continue H2 blocker, refer to gastroenterology given persistent symptoms on meds.  Hyperlipidemia, unspecified hyperlipidemia type  -Low ASCVD risk score previously.  Continue diet/exercise approach.  Meds ordered this encounter  Medications  . valACYclovir (VALTREX) 500 MG tablet    Sig: Take 1 tablet (500 mg total) by mouth daily.    Dispense:  90 tablet    Refill:  3  . DULoxetine (CYMBALTA) 60 MG capsule    Sig: Take 1 capsule (60 mg total) by mouth daily.    Dispense:  90 capsule    Refill:  2  . lisinopril-hydrochlorothiazide (ZESTORETIC) 20-12.5 MG tablet    Sig: Take 1 tablet by mouth daily.    Dispense:  90 tablet    Refill:  1   Patient Instructions    Try twice daily dosing instead of all at once for the Pepcid.  I will refer you to gastroenterology.  Continue to try to avoid trigger foods.  No change in blood pressure medications for now, other refills ordered.    If you have lab work done today you will be contacted with your lab results within the next 2 weeks.  If you have not heard from [place] then please contact us. The fastest way to get your results is to register for My Chart.   IF you received an x-ray today, you will receive an invoice from [facility]. Please contact [facility] at [phone] with questions or concerns regarding your invoice.   IF you received labwork today, you will receive an invoice from [facility]. Please contact [facility] at [phone] with questions or concerns regarding your invoice.   Our billing staff will not be able  to assist you with questions regarding bills from these companies.  You will be contacted with the lab results as soon as they are available. The fastest way to get your results is to activate your My Chart account. Instructions are located on the last page of this paperwork. If you have not heard from [place] regarding the results in 2 weeks, please contact  this office.         Signed, Merri Ray, MD [facility]

## 2020-02-18 NOTE — Patient Instructions (Addendum)
  Try twice daily dosing instead of all at once for the Pepcid.  I will refer you to gastroenterology.  Continue to try to avoid trigger foods.  No change in blood pressure medications for now, other refills ordered.    If you have lab work done today you will be contacted with your lab results within the next 2 weeks.  If you have not heard from [place] then please contact us. The fastest way to get your results is to register for My Chart.   IF you received an x-ray today, you will receive an invoice from [facility]. Please contact [facility] at [phone] with questions or concerns regarding your invoice.   IF you received labwork today, you will receive an invoice from [facility]. Please contact [facility] at [phone] with questions or concerns regarding your invoice.   Our billing staff will not be able to assist you with questions regarding bills from these companies.  You will be contacted with the lab results as soon as they are available. The fastest way to get your results is to activate your My Chart account. Instructions are located on the last page of this paperwork. If you have not heard from [place] regarding the results in 2 weeks, please contact this office.

## 2020-02-19 ENCOUNTER — Encounter: Payer: Self-pay | Admitting: Family Medicine

## 2020-03-09 NOTE — Telephone Encounter (Signed)
No action needed

## 2020-03-15 ENCOUNTER — Ambulatory Visit: Payer: 59 | Admitting: Family Medicine

## 2020-03-23 ENCOUNTER — Encounter: Payer: Self-pay | Admitting: Gastroenterology

## 2020-04-27 ENCOUNTER — Ambulatory Visit: Payer: 59 | Admitting: Gastroenterology

## 2020-04-27 ENCOUNTER — Encounter: Payer: Self-pay | Admitting: Gastroenterology

## 2020-04-27 VITALS — BP 106/72 | HR 62 | Ht 75.0 in | Wt 246.5 lb

## 2020-04-27 DIAGNOSIS — R05 Cough: Secondary | ICD-10-CM | POA: Diagnosis not present

## 2020-04-27 DIAGNOSIS — K219 Gastro-esophageal reflux disease without esophagitis: Secondary | ICD-10-CM

## 2020-04-27 DIAGNOSIS — R053 Chronic cough: Secondary | ICD-10-CM

## 2020-04-27 NOTE — Progress Notes (Signed)
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  He has heartburn and regurgitation for which she was originally on generic omeprazole when it was first made OTC.  He got a rash so it was discontinued and he has not been any other PPI since then.  He then went on prescription strength Zantac twice a day and recalls good control of symptoms until he had to stop it when it was removed from the market.  Prescription strength famotidine at either once or recent increase to twice a day has not been nearly as helpful.  He has breakthrough heartburn and regurgitation.  He also has a chronic cough that he feels is related to the reflux as well. Ethan Knox denies dysphagia, odynophagia, nausea, vomiting, loss of appetite.  He has gained about 20 pounds over the last year.  Bowel habits are regular without rectal bleeding, and he does not know of any family history of colon or rectal cancer.  From 02/18/20 PCP visit: "March 2 visit with Sarah, amlodipine was discontinued, started on Lisinopril HCTZ 20/12.5 mg daily.  Evaluated by cardiology March 24 with history of heart murmur.  Transthoracic echo 01/10/2020 EF 65 to 70%, normal RV function, no significant valvular disease.  Suspected benign flow murmur. No new side effects - working better than amlodipine.  Home readings:none. "  ROS:  Review of Systems  Constitutional: Negative for appetite change and unexpected weight change.  HENT: Negative for mouth sores and voice change.   Eyes: Negative for pain and redness.  Respiratory: Negative for cough and shortness of breath.   Cardiovascular: Negative for chest pain and  palpitations.  Genitourinary: Negative for dysuria and hematuria.  Musculoskeletal: Negative for arthralgias and myalgias.  Skin: Negative for pallor and rash.  Neurological: Negative for weakness and headaches.  Hematological: Negative for adenopathy.     Past Medical History: Past Medical History:  Diagnosis Date  . Depression   . HSV-2 (herpes simplex virus 2) infection   . Hypertension   . Sleep disorder, shift work      Past Surgical History: Past Surgical History:  Procedure Laterality Date  . VASECTOMY       Family History: Family History  Problem Relation Age of Onset  . Diabetes Father   . Arthritis Father        gout  . Heart disease Father   . Mental retardation Sister   . Heart disease Paternal Grandmother   . Colon cancer Neg Hx   . Esophageal cancer Neg Hx   . Pancreatic cancer Neg Hx   . Stomach cancer Neg Hx     Social History: Social History   Socioeconomic History  . Marital status: Married    Spouse name: Ethan Knox  . Number of children: 1  . Years of education: 40  . Highest education level: Not on file  Occupational History  . Occupation: Engineer, structural  Tobacco Use  . Smoking status: Current Some Day Smoker    Types: Cigars  . Smokeless tobacco: Never Used  . Tobacco comment: hx occasional cigars  Substance and Sexual Activity  . Alcohol use: Yes    Comment: 3-4 beers/liqour drinks per week   .  Drug use: No  . Sexual activity: Yes  Other Topics Concern  . Not on file  Social History Narrative   Lives with his wife.  His 50 year old son lives in [place].   Social Determinants of Health   Financial Resource Strain:   . Difficulty of Paying Living Expenses:   Food Insecurity:   . Worried About Charity fundraiser in the Last Year:   . Arboriculturist in the Last Year:   Transportation Needs:   . Film/video editor (Medical):   Marland Kitchen Lack of Transportation (Non-Medical):   Physical Activity:   . Days of Exercise per  Week:   . Minutes of Exercise per Session:   Stress:   . Feeling of Stress :   Social Connections:   . Frequency of Communication with Friends and Family:   . Frequency of Social Gatherings with Friends and Family:   . Attends Religious Services:   . Active Member of Clubs or Organizations:   . Attends Archivist Meetings:   Marland Kitchen Marital Status:    He smokes an occasional cigar.  Corporal with the [employer] Allergies: Allergies  Allergen Reactions  . Prilosec [Omeprazole Magnesium] Rash    Outpatient Meds: Current Outpatient Medications  Medication Sig Dispense Refill  . DULoxetine (CYMBALTA) 60 MG capsule Take 1 capsule (60 mg total) by mouth daily. 90 capsule 2  . lisinopril-hydrochlorothiazide (ZESTORETIC) 20-12.5 MG tablet Take 1 tablet by mouth daily. 90 tablet 1  . valACYclovir (VALTREX) 500 MG tablet Take 1 tablet (500 mg total) by mouth daily. 90 tablet 3  . zolpidem (AMBIEN) 10 MG tablet Take 0.5-1 tablets (5-10 mg total) by mouth at bedtime as needed for sleep. 30 tablet 1   No current facility-administered medications for this visit.      ___________________________________________________________________ Objective   Exam:  BP 106/72 (BP Location: Left Arm, Patient Position: Sitting, Cuff Size: Large)   Pulse 62   Ht 6' 3"  (1.905 m)   Wt 246 lb 8 oz (111.8 kg)   SpO2 97%   BMI 30.81 kg/m    General: Well-appearing, normal vocal quality  Eyes: sclera anicteric, no redness  ENT: oral mucosa moist without lesions, no cervical or supraclavicular lymphadenopathy  CV: RRR without murmur, S1/S2, no JVD, no peripheral edema  Resp: clear to auscultation bilaterally, normal RR and effort noted  GI: soft, no tenderness, with active bowel sounds. No guarding or palpable organomegaly noted.  Skin; warm and dry, no rash or jaundice noted  Neuro: awake, alert and oriented x 3. Normal gross motor function and fluent speech  Labs:  CBC Latest Ref  Rng & Units 01/17/2015 06/24/2013  WBC 4.0 - 10.5 K/uL 6.0 5.9  Hemoglobin 13.0 - 17.0 g/dL 16.0 16.2  Hematocrit 39 - 52 % 44.5 48.3  Platelets 150 - 400 K/uL 278 -   CMP Latest Ref Rng & Units 01/11/2020 09/13/2019 08/26/2017  Glucose 65 - 99 mg/dL 116(H) 128(H) 107(H)  BUN 6 - 24 mg/dL 18 17 16   Creatinine 0.76 - 1.27 mg/dL 1.08 0.86 0.72(L)  Sodium 134 - 144 mmol/L 139 138 140  Potassium 3.5 - 5.2 mmol/L 4.3 4.2 4.5  Chloride 96 - 106 mmol/L 103 100 104  CO2 20 - 29 mmol/L 20 19(L) 21  Calcium 8.7 - 10.2 mg/dL 9.9 9.7 9.7  Total Protein 6.0 - 8.5 g/dL 7.5 7.6 7.4  Total Bilirubin 0.0 - 1.2 mg/dL 0.6 0.5 0.6  Alkaline Phos  39 - 117 IU/L 88 85 71  AST 0 - 40 IU/L 35 31 23  ALT 0 - 44 IU/L 61(H) 54(H) 30   No esophageal imaging or prior endoscopy  Assessment: Encounter Diagnoses  Name Primary?  . Gastroesophageal reflux disease, unspecified whether esophagitis present Yes  . Chronic cough     Many years of reflux symptoms, now persistent despite twice daily H2 blocker.  Rash reaction to OTC omeprazole many years ago. With the aid of an anatomy diagram, we discussed the limitation of acid suppression therapy, breadth of diet and lifestyle changes required for reflux control, the possibility of reflux related complications such as esophagitis, stricture or Barrett's esophagus.  Other anatomic considerations such as gastric outlet obstruction or hiatal hernia may contribute to reflux.  Plan:  Upper endoscopy.  He was agreeable after discussion of procedure and risks.  The benefits and risks of the planned procedure were described in detail with the patient or (when appropriate) their health care proxy.  Risks were outlined as including, but not limited to, bleeding, infection, perforation, adverse medication reaction leading to cardiac or pulmonary decompensation, pancreatitis (if ERCP).  The limitation of incomplete mucosal visualization was also discussed.  No guarantees or warranties  were given.  I offered a screening colonoscopy due to the recent change in guidelines started 29, but he politely declines at this time, preferring to focus on the reflux issues for now.  Thank you for the courtesy of this consult.  Please call me with any questions or concerns.  Nelida Meuse III  CC: Referring provider noted above

## 2020-04-27 NOTE — Patient Instructions (Signed)
If you are age 47 or older, your body mass index should be between 23-30. Your Body mass index is 30.81 kg/m. If this is out of the aforementioned range listed, please consider follow up with your Primary Care Provider.  If you are age 22 or younger, your body mass index should be between 19-25. Your Body mass index is 30.81 kg/m. If this is out of the aformentioned range listed, please consider follow up with your Primary Care Provider.   You have been scheduled for an endoscopy. Please follow written instructions given to you at your visit today. If you use inhalers (even only as needed), please bring them with you on the day of your procedure.  It was a pleasure to see you today!  Dr. Loletha Carrow

## 2020-05-01 ENCOUNTER — Encounter: Payer: Self-pay | Admitting: Gastroenterology

## 2020-05-01 ENCOUNTER — Ambulatory Visit (AMBULATORY_SURGERY_CENTER): Payer: 59 | Admitting: Gastroenterology

## 2020-05-01 ENCOUNTER — Other Ambulatory Visit: Payer: Self-pay

## 2020-05-01 VITALS — BP 115/58 | HR 54 | Temp 96.8°F | Resp 11 | Ht 75.0 in | Wt 246.0 lb

## 2020-05-01 DIAGNOSIS — K2100 Gastro-esophageal reflux disease with esophagitis, without bleeding: Secondary | ICD-10-CM | POA: Diagnosis not present

## 2020-05-01 DIAGNOSIS — K219 Gastro-esophageal reflux disease without esophagitis: Secondary | ICD-10-CM

## 2020-05-01 MED ORDER — SODIUM CHLORIDE 0.9 % IV SOLN: 500.0000 mL | Freq: Once | INTRAVENOUS | Status: DC

## 2020-05-01 NOTE — Op Note (Signed)
[facility] Patient Name: Ethan Knox Procedure Date: 05/01/2020 9:58 AM MRN: [id number] Endoscopist: Milbank. Loletha Carrow , MD Age: 49 Referring MD:  Date of Birth: Feb 11, 1971 Gender: Male Account #: [id number] Procedure:                Upper GI endoscopy Indications:              Esophageal reflux symptoms that persist despite                            appropriate therapy (> decade symptoms, intolerant                            of PPI, many years on H2RA - famotidine not working                            as well as prior ranitidine) Medicines:                Monitored Anesthesia Care Procedure:                Pre-Anesthesia Assessment:                           - Prior to the procedure, a History and Physical                            was performed, and patient medications and                            allergies were reviewed. The patient's tolerance of                            previous anesthesia was also reviewed. The risks                            and benefits of the procedure and the sedation                            options and risks were discussed with the patient.                            All questions were answered, and informed consent                            was obtained. Prior Anticoagulants: The patient has                            taken no previous anticoagulant or antiplatelet                            agents. ASA Grade Assessment: II - A patient with                            mild systemic disease. After reviewing the risks  and benefits, the patient was deemed in                            satisfactory condition to undergo the procedure.                           After obtaining informed consent, the endoscope was                            passed under direct vision. Throughout the                            procedure, the patient's blood pressure, pulse, and                            oxygen saturations were  monitored continuously. The                            Endoscope was introduced through the mouth, and                            advanced to the second part of duodenum. The upper                            GI endoscopy was accomplished without difficulty.                            The patient tolerated the procedure well. Scope In: Scope Out: Findings:                 The larynx was normal.                           LA Grade A (one or more mucosal breaks less than 5                            mm, not extending between tops of 2 mucosal folds)                            esophagitis with no bleeding was found at the                            gastroesophageal junction.                           The exam of the esophagus was otherwise normal.                           A single 6 mm submucosal papule (nodule) was found                            on the lesser curvature of the stomach.  The exam of the stomach was otherwise normal.                           The cardia and gastric fundus were normal on                            retroflexion.                           The examined duodenum was normal. Complications:            No immediate complications. Estimated Blood Loss:     Estimated blood loss: none. Impression:               - Normal larynx.                           - LA Grade A reflux esophagitis with no bleeding.                           - A single submucosal papule (nodule) found in the                            stomach.                           - Normal examined duodenum.                           - No specimens collected. Recommendation:           - Patient has a contact number available for                            emergencies. The signs and symptoms of potential                            delayed complications were discussed with the                            patient. Return to normal activities tomorrow.                            Written  discharge instructions were provided to the                            patient.                           - Resume previous diet.                           - Continue present medications.                           - Follow an antireflux regimen indefinitely.                           -  Perform an upper endoscopic ultrasound (UEUS) at                            appointment to be scheduled.                           - After EUS, consultation to consider TIF for                            long-standing reflux symptoms requiring medicine. Joshalyn Ancheta L. Loletha Carrow, MD 05/01/2020 10:21:12 AM This report has been signed electronically.

## 2020-05-01 NOTE — Patient Instructions (Signed)
Please read handouts provided. Continue present medications. Follow an antireflux regimen indefinitely.     YOU HAD AN ENDOSCOPIC PROCEDURE TODAY AT [facility]:   Refer to the procedure report that was given to you for any specific questions about what was found during the examination.  If the procedure report does not answer your questions, please call your gastroenterologist to clarify.  If you requested that your care partner not be given the details of your procedure findings, then the procedure report has been included in a sealed envelope for you to review at your convenience later.  YOU SHOULD EXPECT: Some feelings of bloating in the abdomen. Passage of more gas than usual.  Walking can help get rid of the air that was put into your GI tract during the procedure and reduce the bloating. If you had a lower endoscopy (such as a colonoscopy or flexible sigmoidoscopy) you may notice spotting of blood in your stool or on the toilet paper. If you underwent a bowel prep for your procedure, you may not have a normal bowel movement for a few days.  Please Note:  You might notice some irritation and congestion in your nose or some drainage.  This is from the oxygen used during your procedure.  There is no need for concern and it should clear up in a day or so.  SYMPTOMS TO REPORT IMMEDIATELY:     Following upper endoscopy (EGD)  Vomiting of blood or coffee ground material  New chest pain or pain under the shoulder blades  Painful or persistently difficult swallowing  New shortness of breath  Fever of 100F or higher  Black, tarry-looking stools  For urgent or emergent issues, a gastroenterologist can be reached at any hour by calling [phone]. Do not use MyChart messaging for urgent concerns.    DIET:  We do recommend a small meal at first, but then you may proceed to your regular diet.  Drink plenty of fluids but you should avoid alcoholic beverages for 24  hours.  ACTIVITY:  You should plan to take it easy for the rest of today and you should NOT DRIVE or use heavy machinery until tomorrow (because of the sedation medicines used during the test).    FOLLOW UP: Our staff will call the number listed on your records 48-72 hours following your procedure to check on you and address any questions or concerns that you may have regarding the information given to you following your procedure. If we do not reach you, we will leave a message.  We will attempt to reach you two times.  During this call, we will ask if you have developed any symptoms of COVID 19. If you develop any symptoms (ie: fever, flu-like symptoms, shortness of breath, cough etc.) before then, please call [phone].  If you test positive for Covid 19 in the 2 weeks post procedure, please call and report this information to [place].    If any biopsies were taken you will be contacted by phone or by letter within the next 1-3 weeks.  Please call us at [phone] if you have not heard about the biopsies in 3 weeks.    SIGNATURES/CONFIDENTIALITY: You and/or your care partner have signed paperwork which will be entered into your electronic medical record.  These signatures attest to the fact that that the information above on your After Visit Summary has been reviewed and is understood.  Full responsibility of the confidentiality of this discharge information lies with  you and/or your care-partner.

## 2020-05-01 NOTE — Progress Notes (Signed)
VS by CW  Pt's states no medical or surgical changes since previsit or office visit.

## 2020-05-01 NOTE — Progress Notes (Signed)
To PACU, VSS. Report to Rn.tb Vo for additional meds documented.

## 2020-05-02 ENCOUNTER — Other Ambulatory Visit: Payer: Self-pay

## 2020-05-02 ENCOUNTER — Telehealth: Payer: Self-pay

## 2020-05-02 DIAGNOSIS — K3189 Other diseases of stomach and duodenum: Secondary | ICD-10-CM

## 2020-05-02 NOTE — Telephone Encounter (Signed)
Left message on machine to call back  

## 2020-05-02 NOTE — Telephone Encounter (Signed)
-----   Message from Milus Banister, MD sent at 05/02/2020  5:16 AM EDT ----- Got it, thanks.  Aleksia Freiman, He needs first available EUS with myself or Gabe for incidental gastric submucosal nodule.  Thanks  GM, FYI ----- Message ----- From: Doran Stabler, MD Sent: 05/01/2020   3:07 PM EDT To: Milus Banister, MD, [place],   This is the patient we discussed who needs a non-urgent EUS for submucosal gastric nodule discovered on today's EGD for GERD. Thanks  - HD

## 2020-05-02 NOTE — Progress Notes (Signed)
Agree. Thanks. GM

## 2020-05-02 NOTE — Telephone Encounter (Signed)
EUS scheduled with Dr Ardis Hughs on 06/08/20 at 63 am at [place] test on 7/26 at 1015 am

## 2020-05-03 ENCOUNTER — Telehealth: Payer: Self-pay

## 2020-05-03 ENCOUNTER — Telehealth: Payer: Self-pay | Admitting: *Deleted

## 2020-05-03 NOTE — Telephone Encounter (Signed)
  Follow up Call-  Call back number 05/01/2020  Post procedure Call Back phone  # [phone]  Permission to leave phone message Yes  Some recent data might be hidden     Patient questions:  Do you have a fever, pain , or abdominal swelling? No. Pain Score  0 *  Have you tolerated food without any problems? Yes.    Have you been able to return to your normal activities? Yes.    Do you have any questions about your discharge instructions: Diet   No. Medications  No. Follow up visit  No.  Do you have questions or concerns about your Care? No.  Actions: * If pain score is 4 or above: 1. No action needed, pain <4.Have you developed a fever since your procedure? no  2.   Have you had an respiratory symptoms (SOB or cough) since your procedure? no  3.   Have you tested positive for COVID 19 since your procedure no  4.   Have you had any family members/close contacts diagnosed with the COVID 19 since your procedure?  no   If yes to any of these questions please route to Joylene John, RN and Erenest Rasher, RN

## 2020-05-03 NOTE — Telephone Encounter (Signed)
Left message on follow up call. 

## 2020-05-03 NOTE — Telephone Encounter (Signed)
EUS scheduled, pt instructed and medications reviewed.  Patient instructions mailed to home.  Patient to call with any questions or concerns.  

## 2020-06-05 ENCOUNTER — Other Ambulatory Visit (HOSPITAL_COMMUNITY)
Admission: RE | Admit: 2020-06-05 | Discharge: 2020-06-05 | Disposition: A | Discharge status: Home / Self Care | Payer: 59 | Source: Ambulatory Visit | Attending: Gastroenterology | Admitting: Gastroenterology

## 2020-06-05 DIAGNOSIS — Z20822 Contact with and (suspected) exposure to covid-19: Secondary | ICD-10-CM | POA: Insufficient documentation

## 2020-06-05 DIAGNOSIS — Z01812 Encounter for preprocedural laboratory examination: Secondary | ICD-10-CM | POA: Insufficient documentation

## 2020-06-05 LAB — SARS CORONAVIRUS 2 (TAT 6-24 HRS): SARS Coronavirus 2: NEGATIVE

## 2020-06-08 ENCOUNTER — Ambulatory Visit (HOSPITAL_COMMUNITY): Payer: 59 | Admitting: Registered Nurse

## 2020-06-08 ENCOUNTER — Encounter (HOSPITAL_COMMUNITY): Payer: Self-pay | Admitting: Gastroenterology

## 2020-06-08 ENCOUNTER — Encounter (HOSPITAL_COMMUNITY)
Admission: RE | Disposition: A | Discharge status: Home / Self Care | Payer: Self-pay | Source: Home / Self Care | Attending: Gastroenterology

## 2020-06-08 ENCOUNTER — Other Ambulatory Visit: Payer: Self-pay

## 2020-06-08 ENCOUNTER — Ambulatory Visit (HOSPITAL_COMMUNITY)
Admission: RE | Admit: 2020-06-08 | Discharge: 2020-06-08 | Disposition: A | Discharge status: Home / Self Care | Payer: 59 | Attending: Gastroenterology | Admitting: Gastroenterology

## 2020-06-08 DIAGNOSIS — I10 Essential (primary) hypertension: Secondary | ICD-10-CM | POA: Diagnosis not present

## 2020-06-08 DIAGNOSIS — F17290 Nicotine dependence, other tobacco product, uncomplicated: Secondary | ICD-10-CM | POA: Insufficient documentation

## 2020-06-08 DIAGNOSIS — Z8249 Family history of ischemic heart disease and other diseases of the circulatory system: Secondary | ICD-10-CM | POA: Insufficient documentation

## 2020-06-08 DIAGNOSIS — K219 Gastro-esophageal reflux disease without esophagitis: Secondary | ICD-10-CM | POA: Insufficient documentation

## 2020-06-08 DIAGNOSIS — Z888 Allergy status to other drugs, medicaments and biological substances status: Secondary | ICD-10-CM | POA: Diagnosis not present

## 2020-06-08 DIAGNOSIS — K319 Disease of stomach and duodenum, unspecified: Secondary | ICD-10-CM | POA: Diagnosis present

## 2020-06-08 DIAGNOSIS — K3189 Other diseases of stomach and duodenum: Secondary | ICD-10-CM | POA: Diagnosis not present

## 2020-06-08 HISTORY — PX: BIOPSY: SHX5522

## 2020-06-08 HISTORY — PX: EUS: SHX5427

## 2020-06-08 HISTORY — PX: ESOPHAGOGASTRODUODENOSCOPY (EGD) WITH PROPOFOL: SHX5813

## 2020-06-08 SURGERY — UPPER ENDOSCOPIC ULTRASOUND (EUS) RADIAL
Anesthesia: Monitor Anesthesia Care

## 2020-06-08 MED ORDER — PROPOFOL 500 MG/50ML IV EMUL
INTRAVENOUS | Status: DC | PRN
  Administered 2020-06-08: 300 ug/kg/min via INTRAVENOUS

## 2020-06-08 MED ORDER — GLYCOPYRROLATE 0.2 MG/ML IJ SOLN
INTRAMUSCULAR | Status: DC | PRN
  Administered 2020-06-08: .1 mg via INTRAVENOUS

## 2020-06-08 MED ORDER — LACTATED RINGERS IV SOLN: INTRAVENOUS | Status: DC

## 2020-06-08 MED ORDER — LIDOCAINE HCL (CARDIAC) PF 100 MG/5ML IV SOSY
PREFILLED_SYRINGE | INTRAVENOUS | Status: DC | PRN
Start: 2020-06-08 — End: 2020-06-08
  Administered 2020-06-08: 100 mg via INTRAVENOUS

## 2020-06-08 MED ORDER — SODIUM CHLORIDE 0.9 % IV SOLN: INTRAVENOUS | Status: DC

## 2020-06-08 MED ORDER — LACTATED RINGERS IV SOLN: INTRAVENOUS | Status: DC | PRN

## 2020-06-08 NOTE — Anesthesia Postprocedure Evaluation (Signed)
Anesthesia Post Note  Patient: Ethan Knox  Procedure(s) Performed: UPPER ENDOSCOPIC ULTRASOUND (EUS) RADIAL (N/A ) BIOPSY     Patient location during evaluation: PACU Anesthesia Type: MAC Level of consciousness: awake and alert Pain management: pain level controlled Vital Signs Assessment: post-procedure vital signs reviewed and stable Respiratory status: spontaneous breathing, nonlabored ventilation and respiratory function stable Cardiovascular status: stable and blood pressure returned to baseline Postop Assessment: no apparent nausea or vomiting Anesthetic complications: no   No complications documented.  Last Vitals:  Vitals:   06/08/20 0838 06/08/20 1027  BP: (!) 144/84 (!) 123/61  Pulse: 52 51  Resp: 20 (!) 11  Temp: 36.6 C 36.5 C  SpO2: 99% 98%    Last Pain:  Vitals:   06/08/20 1027  TempSrc: Oral  PainSc:                  Lindy Pennisi A.

## 2020-06-08 NOTE — Anesthesia Preprocedure Evaluation (Signed)
Anesthesia Evaluation  Patient identified by MRN, date of birth, ID band Patient awake    Reviewed: Allergy & Precautions, NPO status , Patient's Chart, lab work & pertinent test results, reviewed documented beta blocker date and time   Airway Mallampati: II  TM Distance: >3 FB Neck ROM: Full    Dental no notable dental hx. (+) Teeth Intact   Pulmonary Current Smoker and Patient abstained from smoking.,    Pulmonary exam normal breath sounds clear to auscultation       Cardiovascular hypertension, Pt. on medications Normal cardiovascular exam Rhythm:Regular Rate:Normal     Neuro/Psych PSYCHIATRIC DISORDERS Depression negative neurological ROS     GI/Hepatic Neg liver ROS, GERD  Medicated and Controlled,Gastric Polyp   Endo/Other  negative endocrine ROS  Renal/GU negative Renal ROS  negative genitourinary   Musculoskeletal negative musculoskeletal ROS (+)   Abdominal   Peds  Hematology negative hematology ROS (+)   Anesthesia Other Findings   Reproductive/Obstetrics HSV                             Anesthesia Physical Anesthesia Plan  ASA: II  Anesthesia Plan: MAC   Post-op Pain Management:    Induction:   PONV Risk Score and Plan: 0 and Propofol infusion and Treatment may vary due to age or medical condition  Airway Management Planned: Natural Airway and Nasal Cannula  Additional Equipment:   Intra-op Plan:   Post-operative Plan:   Informed Consent: I have reviewed the patients History and Physical, chart, labs and discussed the procedure including the risks, benefits and alternatives for the proposed anesthesia with the patient or authorized representative who has indicated his/her understanding and acceptance.     Dental advisory given  Plan Discussed with: CRNA and Anesthesiologist  Anesthesia Plan Comments:         Anesthesia Quick Evaluation

## 2020-06-08 NOTE — Transfer of Care (Signed)
Immediate Anesthesia Transfer of Care Note  Patient: Ethan Knox  Procedure(s) Performed: UPPER ENDOSCOPIC ULTRASOUND (EUS) RADIAL (N/A ) BIOPSY  Patient Location: PACU  Anesthesia Type:MAC  Level of Consciousness: awake, alert , oriented and patient cooperative  Airway & Oxygen Therapy: Patient Spontanous Breathing and Patient connected to face mask oxygen  Post-op Assessment: Report given to RN, Post -op Vital signs reviewed and stable and Patient moving all extremities X 4  Post vital signs: stable  Last Vitals:  Vitals Value Taken Time  BP 110/69 06/08/20 1030  Temp    Pulse 48 06/08/20 1031  Resp 12 06/08/20 1031  SpO2 98 % 06/08/20 1031  Vitals shown include unvalidated device data.  Last Pain:  Vitals:   06/08/20 0838  TempSrc: Oral  PainSc: 0-No pain         Complications: No complications documented.

## 2020-06-08 NOTE — Op Note (Signed)
[facility] Patient Name: Ethan Knox Procedure Date: 06/08/2020 MRN: [id number] Attending MD: Milus Banister , MD Date of Birth: 10-Dec-1970 CSN: [id number] Age: 49 Admit Type: Outpatient Procedure:                Upper EUS Indications:              Incidental gastric subepithelial lesion noted on                            recent EGD for GERD Providers:                Milus Banister, MD, Cleda Daub, RN, Laverda Sorenson, Technician, Tyrone Apple, Technician,                            Enrigue Catena, CRNA Referring MD:             Wilfrid Lund, MD Medicines:                Monitored Anesthesia Care Complications:            No immediate complications. Estimated blood loss:                            None. Estimated Blood Loss:     Estimated blood loss: none. Procedure:                Pre-Anesthesia Assessment:                           - Prior to the procedure, a History and Physical                            was performed, and patient medications and                            allergies were reviewed. The patient's tolerance of                            previous anesthesia was also reviewed. The risks                            and benefits of the procedure and the sedation                            options and risks were discussed with the patient.                            All questions were answered, and informed consent                            was obtained. Prior Anticoagulants: The patient has                            taken no  previous anticoagulant or antiplatelet                            agents. ASA Grade Assessment: II - A patient with                            mild systemic disease. After reviewing the risks                            and benefits, the patient was deemed in                            satisfactory condition to undergo the procedure.                           After obtaining informed consent, the  endoscope was                            passed under direct vision. Throughout the                            procedure, the patient's blood pressure, pulse, and                            oxygen saturations were monitored continuously. The                            [id number] ([id number]) Olympus Radial EUS was                            introduced through the mouth, and advanced to the                            second part of duodenum. The upper EUS was                            accomplished without difficulty. The patient                            tolerated the procedure well. Scope In: Scope Out: Findings:      Endoscopic findings (using adult gastroscope, following EUS evaluation):      1. Small gastric subepithelial lesion noted in the fundus with normal       overlying mucosa. I sampled the mass with tunnel biopsies following EUS       evaluation.      2. Limited EGD was otherwise normal.      ENDOSONOGRAPHIC FINDING: :      1. The fundus subipethelial lesion described above correlated with an       8.75m by 5.561mhypoechoic, homogeneous mass that clearly communicated       with the muscularis priopria layer of the gastric wall.      2. Gastric wall was otherwise normal.      3. No perigastric adenopathy.      4. Limited views of the pancreas, spleen, liver were all normal. Impression:               -  8.26m by 5.547mhypoechoic mass that clearly                            communicates with the muscularis propria of the                            gastric fundus wall. This was sampled via tunnel                            biopsy method. Await final pathology results. Moderate Sedation:      Not Applicable - Patient had care per Anesthesia. Recommendation:           - Discharge patient to home (ambulatory). Procedure Code(s):        --- Professional ---                           43940 [id number]py, flexible,                            transoral; with endoscopic  ultrasound examination                            limited to the esophagus, stomach or duodenum, and                            adjacent structures                           43239, Esophagogastroduodenoscopy, flexible,                            transoral; with biopsy, single or multiple Diagnosis Code(s):        --- Professional ---                           K31.89, Other diseases of stomach and duodenum CPT copyright 2019 American Medical Association. All rights reserved. The codes documented in this report are preliminary and upon coder review may  be revised to meet current compliance requirements. DaMilus BanisterMD 06/08/2020 10:30:03 AM This report has been signed electronically. Number of Addenda: 0

## 2020-06-08 NOTE — Anesthesia Procedure Notes (Signed)
Procedure Name: MAC Date/Time: 06/08/2020 9:56 AM Performed by: Lissa Morales, CRNA Pre-anesthesia Checklist: Patient identified, Emergency Drugs available, Suction available, Patient being monitored and Timeout performed Patient Re-evaluated:Patient Re-evaluated prior to induction Oxygen Delivery Method: Simple face mask Placement Confirmation: positive ETCO2

## 2020-06-08 NOTE — H&P (Signed)
HPI: This is a incidental subepithelial mass in the stomach during recent EGD for GERD   ROS: complete GI ROS as described in HPI, all other review negative.  Constitutional:  No unintentional weight loss   Past Medical History:  Diagnosis Date  . Depression   . HSV-2 (herpes simplex virus 2) infection   . Hypertension   . Sleep disorder, shift work     Past Surgical History:  Procedure Laterality Date  . VASECTOMY      No current facility-administered medications for this encounter.    Allergies as of 05/02/2020 - Review Complete 05/01/2020  Allergen Reaction Noted  . Prilosec [omeprazole magnesium] Rash 01/23/2012    Family History  Problem Relation Age of Onset  . Diabetes Father   . Arthritis Father        gout  . Heart disease Father   . Mental retardation Sister   . Heart disease Paternal Grandmother   . Colon cancer Neg Hx   . Esophageal cancer Neg Hx   . Pancreatic cancer Neg Hx   . Stomach cancer Neg Hx     Social History   Socioeconomic History  . Marital status: Married    Spouse name: [name]  . Number of children: 1  . Years of education: 67  . Highest education level: Not on file  Occupational History  . Occupation: Engineer, structural  Tobacco Use  . Smoking status: Current Some Day Smoker    Types: Cigars  . Smokeless tobacco: Never Used  . Tobacco comment: hx occasional cigars  Vaping Use  . Vaping Use: Never used  Substance and Sexual Activity  . Alcohol use: Yes    Comment: 3-4 beers/liqour drinks per week   . Drug use: No  . Sexual activity: Yes  Other Topics Concern  . Not on file  Social History Narrative   Lives with his wife.  His 4 year old son lives in [place].   Social Determinants of Health   Financial Resource Strain:   . Difficulty of Paying Living Expenses:   Food Insecurity:   . Worried About Charity fundraiser in the Last Year:   . Arboriculturist in the Last Year:   Transportation Needs:   . Lexicographer (Medical):   Marland Kitchen Lack of Transportation (Non-Medical):   Physical Activity:   . Days of Exercise per Week:   . Minutes of Exercise per Session:   Stress:   . Feeling of Stress :   Social Connections:   . Frequency of Communication with Friends and Family:   . Frequency of Social Gatherings with Friends and Family:   . Attends Religious Services:   . Active Member of Clubs or Organizations:   . Attends Archivist Meetings:   Marland Kitchen Marital Status:   Intimate Partner Violence:   . Fear of Current or Ex-Partner:   . Emotionally Abused:   Marland Kitchen Physically Abused:   . Sexually Abused:      Physical Exam: Ht 6' 3"  (1.905 m)   Wt (!) 108.9 kg   BMI 30.00 kg/m  Constitutional: generally well-appearing Psychiatric: alert and oriented x3 Abdomen: soft, nontender, nondistended, no obvious ascites, no peritoneal signs, normal bowel sounds No peripheral edema noted in lower extremities  Assessment and plan: 49 y.o. male with an incidental subepithelial mass in the gastric body  For EUS evaluation today  Please see the "Patient Instructions" section for addition details about the plan.  Quillian Quince  Ardis Hughs, MD [facility] 06/08/2020, 8:32 AM

## 2020-06-08 NOTE — Discharge Instructions (Signed)
YOU HAD AN ENDOSCOPIC PROCEDURE TODAY: Refer to the procedure report and other information in the discharge instructions given to you for any specific questions about what was found during the examination. If this information does not answer your questions, please call [place] office at [phone] to clarify.   YOU SHOULD EXPECT: Some feelings of bloating in the abdomen. Passage of more gas than usual. Walking can help get rid of the air that was put into your GI tract during the procedure and reduce the bloating. If you had a lower endoscopy (such as a colonoscopy or flexible sigmoidoscopy) you may notice spotting of blood in your stool or on the toilet paper. Some abdominal soreness may be present for a day or two, also.  DIET: Your first meal following the procedure should be a light meal and then it is ok to progress to your normal diet. A half-sandwich or bowl of soup is an example of a good first meal. Heavy or fried foods are harder to digest and may make you feel nauseous or bloated. Drink plenty of fluids but you should avoid alcoholic beverages for 24 hours. If you had a esophageal dilation, please see attached instructions for diet.    ACTIVITY: Your care partner should take you home directly after the procedure. You should plan to take it easy, moving slowly for the rest of the day. You can resume normal activity the day after the procedure however YOU SHOULD NOT DRIVE, use power tools, machinery or perform tasks that involve climbing or major physical exertion for 24 hours (because of the sedation medicines used during the test).   SYMPTOMS TO REPORT IMMEDIATELY: A gastroenterologist can be reached at any hour. Please call [phone]  for any of the following symptoms:   Following upper endoscopy (EGD, EUS, ERCP, esophageal dilation) Vomiting of blood or coffee ground material  New, significant abdominal pain  New, significant chest pain or pain under the shoulder blades  Painful or  persistently difficult swallowing  New shortness of breath  Black, tarry-looking or red, bloody stools  FOLLOW UP:  If any biopsies were taken you will be contacted by phone or by letter within the next 1-3 weeks. Call [phone]  if you have not heard about the biopsies in 3 weeks.  Please also call with any specific questions about appointments or follow up tests.  

## 2020-06-09 ENCOUNTER — Encounter (HOSPITAL_COMMUNITY): Payer: Self-pay | Admitting: Gastroenterology

## 2020-06-12 LAB — SURGICAL PATHOLOGY

## 2020-06-16 NOTE — Telephone Encounter (Signed)
Ethan Knox,  This is a patient of mine with GERD interested in meeting with Dr. Bryan Lemma to consider a TIF.  Please arrange a clinic visit with him.  [name],  Patient free who I think is a good TIF candidate.  - HD

## 2020-08-08 ENCOUNTER — Ambulatory Visit: Payer: 59 | Admitting: Gastroenterology

## 2020-08-23 ENCOUNTER — Ambulatory Visit: Payer: 59 | Admitting: Family Medicine

## 2020-08-23 ENCOUNTER — Other Ambulatory Visit: Payer: Self-pay

## 2020-08-23 ENCOUNTER — Encounter: Payer: Self-pay | Admitting: Family Medicine

## 2020-08-23 VITALS — BP 121/76 | HR 54 | Temp 97.3°F | Ht 75.0 in | Wt 254.2 lb

## 2020-08-23 DIAGNOSIS — Z23 Encounter for immunization: Secondary | ICD-10-CM

## 2020-08-23 DIAGNOSIS — F32A Depression, unspecified: Secondary | ICD-10-CM

## 2020-08-23 DIAGNOSIS — E785 Hyperlipidemia, unspecified: Secondary | ICD-10-CM

## 2020-08-23 DIAGNOSIS — I10 Essential (primary) hypertension: Secondary | ICD-10-CM

## 2020-08-23 MED ORDER — LISINOPRIL-HYDROCHLOROTHIAZIDE 20-12.5 MG PO TABS: 1.0000 | ORAL_TABLET | Freq: Every day | ORAL | 2 refills | Status: DC

## 2020-08-23 MED ORDER — DULOXETINE HCL 60 MG PO CPEP: 60.0000 mg | ORAL_CAPSULE | Freq: Every day | ORAL | 2 refills | Status: DC

## 2020-08-23 NOTE — Patient Instructions (Addendum)
  Thanks for coming in today.  No change in medications at this time.  Continue try to find ways to exercise on days off if possible.  Follow-up in 6 months but let me know if there are questions prior to that time.     If you have lab work done today you will be contacted with your lab results within the next 2 weeks.  If you have not heard from [place] then please contact us. The fastest way to get your results is to register for My Chart.   IF you received an x-ray today, you will receive an invoice from [facility]. Please contact [facility] at [phone] with questions or concerns regarding your invoice.   IF you received labwork today, you will receive an invoice from [facility]. Please contact [facility] at [phone] with questions or concerns regarding your invoice.   Our billing staff will not be able to assist you with questions regarding bills from these companies.  You will be contacted with the lab results as soon as they are available. The fastest way to get your results is to activate your My Chart account. Instructions are located on the last page of this paperwork. If you have not heard from [place] regarding the results in 2 weeks, please contact this office.

## 2020-08-23 NOTE — Progress Notes (Signed)
Subjective:  Patient ID: Ethan Knox, male    DOB: [birth year]  Age: 49 y.o. MRN: [id number]  CC:  Chief Complaint  Patient presents with   Medical Management of Chronic Issues    med review 6 month f/u     HPI Ethan Knox presents for   Depression with anxiety: cymbalta 88m qd, ambien prn-need less evident with change in job/position with work. Mood doing well - no new side effects.  No recent need for Ambien. Still night shift.  Work going ok.  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 Off days having to catch up. hypervigilant at work, relaxed out of work. Short staffed at work now.   [facility] 08/23/2020 02/18/2020 01/11/2020 12/14/2019 09/13/2019  Decreased Interest 0 0 0 0 0  Down, Depressed, Hopeless 0 0 0 0 0  PHQ - 2 Score 0 0 0 0 0  Altered sleeping - - - - -  Tired, decreased energy - - - - -  Change in appetite - - - - -  Feeling bad or failure about yourself  - - - - -  Trouble concentrating - - - - -  Moving slowly or fidgety/restless - - - - -  Suicidal thoughts - - - - -  PHQ-9 Score - - - - -  Difficult doing work/chores - - - - -   GERD: Followed by Dr. 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  Plan for endoscopy in a year.  Option of transoral incision-less fundoplication for his ongoing reflux.  To meet with Dr. CBryan Lemmato discuss that further.appt in November. Still on pepcid.   Hypertension: Lisinopril/hct - 20/12.591mqd.  Home readings: none.  BP Readings from Last 3 Encounters:  08/23/20 121/76  06/08/20 (!) 131/84  05/01/20 (!) 115/58   Lab Results  Component Value Date   CREATININE 1.08 01/11/2020   Hyperlipidemia: No current meds. Fasting today.  The 10-year ASCVD risk score (GMikey BussingC JrBrooke Bonito et al., 2013) is: 7.3%   Values used to calculate the score:     Age: [age]     Sex: Male     Is Non-Hispanic African American: No      Diabetic: No     Tobacco smoker: Yes     Systolic Blood Pressure: 12893mHg     Is BP treated: Yes     HDL Cholesterol: 46 mg/dL     Total Cholesterol: 200 mg/dL  Lab Results  Component Value Date   CHOL 200 (H) 01/11/2020   HDL 46 01/11/2020   LDLCALC 126 (H) 01/11/2020   TRIG 158 (H) 01/11/2020   CHOLHDL 4.3 01/11/2020   Lab Results  Component Value Date   ALT 61 (H) 01/11/2020   AST 35 01/11/2020   ALKPHOS 88 01/11/2020   BILITOT 0.6 01/11/2020       History Patient Active Problem List   Diagnosis Date Noted   Gastric nodule    Heart murmur 01/11/2020   Essential hypertension 12/14/2019   Visit for suture removal 12/14/2019   HSV-2 (herpes simplex virus 2) infection    Sleep disorder, shift work    Past Medical History:  Diagnosis Date   Depression    HSV-2 (herpes simplex virus 2) infection    Hypertension    Sleep disorder, shift work    Past Surgical History:  Procedure Laterality Date   BIOPSY  06/08/2020  Procedure: BIOPSY;  Surgeon: Milus Banister, MD;  Location: [facility];  Service: Endoscopy;;   ESOPHAGOGASTRODUODENOSCOPY (EGD) WITH PROPOFOL N/A 06/08/2020   Procedure: ESOPHAGOGASTRODUODENOSCOPY (EGD) WITH PROPOFOL;  Surgeon: Milus Banister, MD;  Location: [facility];  Service: Endoscopy;  Laterality: N/A;   EUS N/A 06/08/2020   Procedure: UPPER ENDOSCOPIC ULTRASOUND (EUS) RADIAL;  Surgeon: Milus Banister, MD;  Location: [facility];  Service: Endoscopy;  Laterality: N/A;   VASECTOMY     Allergies  Allergen Reactions   Prilosec [Omeprazole Magnesium] Rash   Prior to Admission medications   Medication Sig Start Date End Date Taking? Authorizing Provider  cetirizine (ZYRTEC) 10 MG tablet Take 10 mg by mouth daily.   Yes [provider]  DULoxetine (CYMBALTA) 60 MG capsule Take 1 capsule (60 mg total) by mouth daily. 02/18/20  Yes [name], MD  famotidine (PEPCID) 20 MG tablet Take 20 mg by mouth 2 (two)  times daily.    Yes [provider]  fluticasone (FLONASE) 50 MCG/ACT nasal spray Place 1 spray into both nostrils daily as needed for allergies or rhinitis.   Yes [provider]  lisinopril-hydrochlorothiazide (ZESTORETIC) 20-12.5 MG tablet Take 1 tablet by mouth daily. 02/18/20  Yes [name], MD  valACYclovir (VALTREX) 500 MG tablet Take 1 tablet (500 mg total) by mouth daily. 02/18/20  Yes [name], MD  zolpidem (AMBIEN) 10 MG tablet Take 0.5-1 tablets (5-10 mg total) by mouth at bedtime as needed for sleep. 09/13/19 08/23/20 Yes [name], MD   Social History   Socioeconomic History   Marital status: Married    Spouse name: [name]   Number of children: 1   Years of education: 14   Highest education level: Not on file  Occupational History   Occupation: Engineer, structural  Tobacco Use   Smoking status: Current Some Day Smoker    Types: Cigars   Smokeless tobacco: Never Used   Tobacco comment: hx occasional cigars  Vaping Use   Vaping Use: Never used  Substance and Sexual Activity   Alcohol use: Yes    Comment: 3-4 beers/liqour drinks per week    Drug use: No   Sexual activity: Yes  Other Topics Concern   Not on file  Social History Narrative   Lives with his wife.  His 79 year old son lives in [place].   Social Determinants of Health   Financial Resource Strain:    Difficulty of Paying Living Expenses: Not on file  Food Insecurity:    Worried About Charity fundraiser in the Last Year: Not on file   [employer] of Food in the Last Year: Not on file  Transportation Needs:    Lack of Transportation (Medical): Not on file   Lack of Transportation (Non-Medical): Not on file  Physical Activity:    Days of Exercise per Week: Not on file   Minutes of Exercise per Session: Not on file  Stress:    Feeling of Stress : Not on file  Social Connections:    Frequency of Communication with Friends and Family: Not on file    Frequency of Social Gatherings with Friends and Family: Not on file   Attends Religious Services: Not on file   Active Member of Clubs or Organizations: Not on file   Attends Archivist Meetings: Not on file   Marital Status: Not on file  Intimate Partner Violence:    Fear of Current or Ex-Partner:  Not on file   Emotionally Abused: Not on file   Physically Abused: Not on file   Sexually Abused: Not on file    Review of Systems  Constitutional: Negative for fatigue and unexpected weight change.  Eyes: Negative for visual disturbance.  Respiratory: Negative for cough, chest tightness and shortness of breath.   Cardiovascular: Negative for chest pain, palpitations and leg swelling.  Gastrointestinal: Negative for abdominal pain and blood in stool.  Neurological: Negative for dizziness, light-headedness and headaches.     Objective:   Vitals:   08/23/20 1123  BP: 121/76  Pulse: (!) 54  Temp: (!) 97.3 F (36.3 C)  TempSrc: Temporal  SpO2: 96%  Weight: 254 lb 3.2 oz (115.3 kg)  Height: 6' 3"  (1.905 m)     Physical Exam Vitals reviewed.  Constitutional:      Appearance: He is well-developed.  HENT:     Head: Normocephalic and atraumatic.  Eyes:     Pupils: Pupils are equal, round, and reactive to light.  Neck:     Vascular: No carotid bruit or JVD.  Cardiovascular:     Rate and Rhythm: Normal rate and regular rhythm.     Heart sounds: Normal heart sounds. No murmur heard.   Pulmonary:     Effort: Pulmonary effort is normal.     Breath sounds: Normal breath sounds. No rales.  Skin:    General: Skin is warm and dry.  Neurological:     Mental Status: He is alert and oriented to person, place, and time.        Assessment & Plan:  JOUSHA SCHWANDT is a 49 y.o. male . Essential hypertension - Plan: lisinopril-hydrochlorothiazide (ZESTORETIC) 20-12.5 MG tablet, Comprehensive metabolic panel  - Stable, tolerating current regimen.  Medications refilled. Labs pending as above.   Need for immunization against influenza - Plan: Flu Vaccine QUAD 36+ mos IM  Depression, unspecified depression type - Plan: DULoxetine (CYMBALTA) 60 MG capsule  -Overall stable on Cymbalta.  Encouraged exercise/biking on days off possible.  Some increased workload with being short staffed at work at this time.  Self-care discussed.  Hyperlipidemia, unspecified hyperlipidemia type - Plan: Comprehensive metabolic panel, Lipid panel  -Repeat lipids but previous ASCVD risk score not at level for medication.  Meds ordered this encounter  Medications   lisinopril-hydrochlorothiazide (ZESTORETIC) 20-12.5 MG tablet    Sig: Take 1 tablet by mouth daily.    Dispense:  90 tablet    Refill:  2   DULoxetine (CYMBALTA) 60 MG capsule    Sig: Take 1 capsule (60 mg total) by mouth daily.    Dispense:  90 capsule    Refill:  2   Patient Instructions    Thanks for coming in today.  No change in medications at this time.  Continue try to find ways to exercise on days off if possible.  Follow-up in 6 months but let me know if there are questions prior to that time.     If you have lab work done today you will be contacted with your lab results within the next 2 weeks.  If you have not heard from [place] then please contact us. The fastest way to get your results is to register for My Chart.   IF you received an x-ray today, you will receive an invoice from [facility]. Please contact [facility] at [phone] with questions or concerns regarding your invoice.   IF you received labwork today, you will receive an invoice  from [place]. Please contact [facility] at [phone] with questions or concerns regarding your invoice.   Our billing staff will not be able to assist you with questions regarding bills from these companies.  You will be contacted with the lab results as soon as they are available. The fastest way to get your results is  to activate your My Chart account. Instructions are located on the last page of this paperwork. If you have not heard from [place] regarding the results in 2 weeks, please contact this office.          Signed, Merri Ray, MD [facility]

## 2020-08-24 LAB — LIPID PANEL
Chol/HDL Ratio: 4.6 ratio (ref 0.0–5.0)
Cholesterol, Total: 188 mg/dL (ref 100–199)
HDL: 41 mg/dL (ref >39)
LDL Chol Calc (NIH): 116 mg/dL — ABNORMAL HIGH (ref 0–99)
Triglycerides: 174 mg/dL — ABNORMAL HIGH (ref 0–149)
VLDL Cholesterol Cal: 31 mg/dL (ref 5–40)

## 2020-08-24 LAB — COMPREHENSIVE METABOLIC PANEL
ALT: 47 IU/L — ABNORMAL HIGH (ref 0–44)
AST: 29 IU/L (ref 0–40)
Albumin/Globulin Ratio: 1.7 (ref 1.2–2.2)
Albumin: 4.7 g/dL (ref 4.0–5.0)
Alkaline Phosphatase: 81 IU/L (ref 44–121)
BUN/Creatinine Ratio: 13 (ref 9–20)
BUN: 13 mg/dL (ref 6–24)
Bilirubin Total: 0.4 mg/dL (ref 0.0–1.2)
CO2: 21 mmol/L (ref 20–29)
Calcium: 9.5 mg/dL (ref 8.7–10.2)
Chloride: 102 mmol/L (ref 96–106)
Creatinine, Ser: 0.98 mg/dL (ref 0.76–1.27)
GFR calc Af Amer: 106 mL/min/{1.73_m2} (ref >59)
GFR calc non Af Amer: 91 mL/min/{1.73_m2} (ref >59)
Globulin, Total: 2.7 g/dL (ref 1.5–4.5)
Glucose: 103 mg/dL — ABNORMAL HIGH (ref 65–99)
Potassium: 4.4 mmol/L (ref 3.5–5.2)
Sodium: 138 mmol/L (ref 134–144)
Total Protein: 7.4 g/dL (ref 6.0–8.5)

## 2020-09-21 ENCOUNTER — Ambulatory Visit: Payer: 59 | Admitting: Gastroenterology

## 2020-09-21 VITALS — BP 122/68 | HR 70 | Ht 75.0 in | Wt 260.1 lb

## 2020-09-21 DIAGNOSIS — K2100 Gastro-esophageal reflux disease with esophagitis, without bleeding: Secondary | ICD-10-CM | POA: Diagnosis not present

## 2020-09-21 DIAGNOSIS — D214 Benign neoplasm of connective and other soft tissue of abdomen: Secondary | ICD-10-CM

## 2020-09-21 MED ORDER — PANTOPRAZOLE SODIUM 40 MG PO TBEC
40.0000 mg | DELAYED_RELEASE_TABLET | Freq: Two times a day (BID) | ORAL | 0 refills | Status: DC
Start: 2020-09-21 — End: 2020-11-20

## 2020-09-21 NOTE — Progress Notes (Signed)
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  Is a longstanding history of reflux for the last 10+ years.  Was originally treated with omeprazole, but complicated by rash, so he has avoided PPI since then (interestingly, rash only occurred with generic omeprazole, but otherwise did fine with brand name Prilosec).  Then moved to Zantac bid with good control of symptoms, but discontinued due to carcinogenic concerns and removal from market.  Has since been maintained with famotidine, recently increased to bid, but decreasing efficacy.  Now with frequent breakthrough heartburn, regurgitation, and chronic cough.  No dysphagia.  Has gained 20 pounds over the last year or so.   GERD history: -Index symptoms: Heartburn, regurgitation, chronic cough, dysphagia (new over last couple months). Sxs day/night with increasing nocturnal sxs recently -Exacerbating features: Pizza, coffee, tomato based sauces, greasy foods -Medications trialed: Omeprazole (rash), Zantac, Pepcid -Current medications: Zantac restarted this week -Complications: Erosive esophagitis  GERD evaluation: -Last EGD: 04/2020 -Barium esophagram: N/A -Esophageal Manometry: N/A -pH/Impedance: N/A -Bravo: N/A  Endoscopic History: -EGD (05/01/2020, Dr. Loletha Carrow): LA Grade A esophagitis, 6 mm submucosal nodule and lesser curve of stomach -EUS (06/08/2020, Dr. 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  Recommended repeat EUS in 1 year   GERD-HRQL Questionnaire Score: 30/50 (on acid suppression therapy) and marked "dissatisfied" with satisfaction regarding current health condition.    Review of  systems:     No chest pain, no SOB, no fevers, no urinary sx   Past Medical History:  Diagnosis Date  . Depression   . HSV-2 (herpes simplex virus 2) infection   . Hypertension   . Sleep disorder, shift work     Patient's surgical history, family medical history, social history, medications and allergies were all reviewed in Epic    Current Outpatient Medications  Medication Sig Dispense Refill  . cetirizine (ZYRTEC) 10 MG tablet Take 10 mg by mouth daily.    . DULoxetine (CYMBALTA) 60 MG capsule Take 1 capsule (60 mg total) by mouth daily. 90 capsule 2  . famotidine (PEPCID) 20 MG tablet Take 20 mg by mouth 2 (two) times daily.     . Famotidine (ZANTAC 360 PO) Take 1 tablet by mouth daily.    . fluticasone (FLONASE) 50 MCG/ACT nasal spray Place 1 spray into both nostrils daily as needed for allergies or rhinitis.    Marland Kitchen lisinopril-hydrochlorothiazide (ZESTORETIC) 20-12.5 MG tablet Take 1 tablet by mouth daily. 90 tablet 2  . valACYclovir (VALTREX) 500 MG tablet Take 1 tablet (500 mg total) by mouth daily. 90 tablet 3  . zolpidem (AMBIEN) 10 MG tablet Take 0.5-1 tablets (5-10 mg total) by mouth at bedtime as needed for sleep. 30 tablet 1   No current facility-administered medications for this visit.    Physical Exam:     BP 122/68   Pulse 70   Ht 6' 3"  (1.905 m)   Wt 260 lb 2 oz (118 kg)   BMI 32.51 kg/m   GENERAL:  Pleasant male in NAD PSYCH: : Cooperative, normal affect EENT:  conjunctiva pink, mucous membranes moist, neck supple without masses CARDIAC:  RRR, no murmur heard, no peripheral edema PULM: Normal respiratory effort, lungs CTA bilaterally,  no wheezing ABDOMEN:  Nondistended, soft, nontender. No obvious masses, no hepatomegaly,  normal bowel sounds SKIN:  turgor, no lesions seen Musculoskeletal:  Normal muscle tone, normal strength NEURO: Alert and oriented x 3, no focal neurologic deficits   IMPRESSION and PLAN:    1) GERD with Erosive Esophagitis Ethan Knox is a 49 y.o. male with a long-standing history of GERD and erosive esophagitis, initially responsive to H2RA therapy but with waning efficacy and clear esophagitis despite ongoing acid suppression therapy.  He is requesting antireflux surgery with goal of improved/resolved symptoms, resolution of esophagitis, and stopping acid suppression medications. Discussed the pathophysiology of GERD at length, to include the risks of untreated reflux (ie, strictures, Barrett's Esophagus, EAC, etc) as well as the possible treatment with medications vs antireflux surgery. In particular, we discussed the risks, benefits, and alternatives of Transoral Incisionless Fundoplication (TIF), to include Nissen fundoplication, and the patient wishes to proceed with TIF.  -Interestingly, did okay with brand name Prilosec, but only had rash after changing to generic omeprazole.  Given ongoing reflux, continued esophagitis, will trial course of Protonix 40 mg PO BID x8 weeks, then reduce to 40 mg/day as tolerated.  This will not only provide clinical benefit, but also improvement in esophagitis can also lead to improved surgical outcomes -Given clear objective evidence of erosive esophagitis on recent EGD, no further diagnostic evaluation necessary prior to moving forward with TIF -Will submit authorization for TIF -Not authorized, plan for scheduling TIF at [facility].  Discussed potential plan for overnight observation vs newer literature supporting same-day discharge and can make decision postoperatively - NPO at MN prior to the procedure - Confirmed allergies with the patient and no allergies to planned perioperative antibiotics - Reviewed postoperative dietary and activity restrictions with patient and will provide with handout  2) GIST: -Plan for repeat EUS 05/2021 with Dr. Ardis Hughs for ongoing surveillance  I spent 35 minutes of time, including in depth chart review, independent review of results as  outlined above, communicating results with the patient directly, face-to-face time with the patient, coordinating care, and ordering studies and medications as appropriate, and documentation.         Lavena Bullion ,DO, FACG 09/21/2020, 1:58 PM

## 2020-09-21 NOTE — Patient Instructions (Addendum)
If you are age 47 or older, your body mass index should be between 23-30. Your Body mass index is 32.51 kg/m. If this is out of the aforementioned range listed, please consider follow up with your Primary Care Provider.  If you are age 47 or younger, your body mass index should be between 19-25. Your Body mass index is 32.51 kg/m. If this is out of the aformentioned range listed, please consider follow up with your Primary Care Provider.   We have sent the following medications to your pharmacy for you to pick up at your convenience:  protonix 15m  We will call you to schedule the TIF procedure when it is approved from insurance. If you have any questions please call [phone]  Due to recent changes in healthcare laws, you may see the results of your imaging and laboratory studies on MyChart before your provider has had a chance to review them.  We understand that in some cases there may be results that are confusing or concerning to you. Not all laboratory results come back in the same time frame and the provider may be waiting for multiple results in order to interpret others.  Please give [id number] hours in order for your provider to thoroughly review all the results before contacting the office for clarification of your results.   Thank you for choosing [facility].  [name] Cirigliano, D.O.

## 2020-10-24 ENCOUNTER — Telehealth: Payer: Self-pay | Admitting: Gastroenterology

## 2020-10-24 NOTE — Telephone Encounter (Signed)
Pt states he missed a call from a nurse in regards to his TIF procedure.

## 2020-10-24 NOTE — Telephone Encounter (Signed)
Patient will contact his insurance company to find out if the procedure is covered.

## 2020-11-13 ENCOUNTER — Other Ambulatory Visit: Payer: Self-pay | Admitting: Gastroenterology

## 2020-11-20 ENCOUNTER — Other Ambulatory Visit: Payer: Self-pay

## 2020-11-20 MED ORDER — PANTOPRAZOLE SODIUM 40 MG PO TBEC: 40.0000 mg | DELAYED_RELEASE_TABLET | Freq: Two times a day (BID) | ORAL | 0 refills | Status: DC

## 2021-01-07 ENCOUNTER — Other Ambulatory Visit: Payer: Self-pay | Admitting: Gastroenterology

## 2021-01-10 ENCOUNTER — Other Ambulatory Visit: Payer: Self-pay

## 2021-01-11 NOTE — Telephone Encounter (Signed)
Please advise on this RX refill request and Sig Thank you!

## 2021-01-11 NOTE — Telephone Encounter (Signed)
Ok to refill Protonix 40 mg daily, #120, RF5. Thanks

## 2021-01-12 MED ORDER — PANTOPRAZOLE SODIUM 40 MG PO TBEC: 40.0000 mg | DELAYED_RELEASE_TABLET | Freq: Two times a day (BID) | ORAL | 5 refills | Status: DC

## 2021-01-12 NOTE — Telephone Encounter (Signed)
RX sent

## 2021-01-29 ENCOUNTER — Telehealth: Payer: Self-pay | Admitting: Cardiology

## 2021-01-29 NOTE — Telephone Encounter (Signed)
3.21.22 Spoke with patient to schedule 1 yr fu w/Dr Gardiner Rhyme. He did not have his schedule with him and will call back.  LP

## 2021-02-22 ENCOUNTER — Ambulatory Visit: Payer: Self-pay | Admitting: Family Medicine

## 2021-03-07 ENCOUNTER — Telehealth: Payer: Self-pay

## 2021-03-07 ENCOUNTER — Other Ambulatory Visit: Payer: Self-pay

## 2021-03-07 DIAGNOSIS — B009 Herpesviral infection, unspecified: Secondary | ICD-10-CM

## 2021-03-07 MED ORDER — VALACYCLOVIR HCL 500 MG PO TABS: 500.0000 mg | ORAL_TABLET | Freq: Every day | ORAL | 3 refills | Status: DC

## 2021-03-07 NOTE — Telephone Encounter (Signed)
Sent to PCP for approval.  

## 2021-03-07 NOTE — Telephone Encounter (Signed)
..   LAST APPOINTMENT DATE: 08/23/2020  NEXT APPOINTMENT DATE:   MEDICATION: valacyclovir 568m  PHARMACY:  Let patient know to contact pharmacy at the end of the day to make sure medication is ready.  Please notify patient to allow 48-72 hours to process  Encourage patient to contact the pharmacy for refills or they can [place] refills through [place]   LAST REFILL:  QTY:  REFILL DATE:    OTHER COMMENTS:    Okay for refill?  Please advise

## 2021-03-07 NOTE — Telephone Encounter (Signed)
LFD 02/18/20 #90 with 3 refills LOV 09/21/20 NOV none

## 2021-03-30 ENCOUNTER — Telehealth: Payer: Self-pay | Admitting: *Deleted

## 2021-03-30 ENCOUNTER — Emergency Department (HOSPITAL_BASED_OUTPATIENT_CLINIC_OR_DEPARTMENT_OTHER): Payer: 59

## 2021-03-30 ENCOUNTER — Encounter (HOSPITAL_BASED_OUTPATIENT_CLINIC_OR_DEPARTMENT_OTHER): Payer: Self-pay

## 2021-03-30 ENCOUNTER — Other Ambulatory Visit: Payer: Self-pay

## 2021-03-30 ENCOUNTER — Emergency Department (HOSPITAL_BASED_OUTPATIENT_CLINIC_OR_DEPARTMENT_OTHER)
Admission: EM | Admit: 2021-03-30 | Discharge: 2021-03-30 | Disposition: A | Discharge status: Home / Self Care | Payer: 59 | Attending: Emergency Medicine | Admitting: Emergency Medicine

## 2021-03-30 DIAGNOSIS — R319 Hematuria, unspecified: Secondary | ICD-10-CM | POA: Insufficient documentation

## 2021-03-30 DIAGNOSIS — F17290 Nicotine dependence, other tobacco product, uncomplicated: Secondary | ICD-10-CM | POA: Diagnosis not present

## 2021-03-30 DIAGNOSIS — I10 Essential (primary) hypertension: Secondary | ICD-10-CM | POA: Diagnosis not present

## 2021-03-30 DIAGNOSIS — R1031 Right lower quadrant pain: Secondary | ICD-10-CM | POA: Insufficient documentation

## 2021-03-30 DIAGNOSIS — Z79899 Other long term (current) drug therapy: Secondary | ICD-10-CM | POA: Diagnosis not present

## 2021-03-30 DIAGNOSIS — R109 Unspecified abdominal pain: Secondary | ICD-10-CM

## 2021-03-30 HISTORY — DX: Calculus of kidney: N20.0

## 2021-03-30 LAB — CBC WITH DIFFERENTIAL/PLATELET
Abs Immature Granulocytes: 0.02 10*3/uL (ref 0.00–0.07)
Basophils Absolute: 0 10*3/uL (ref 0.0–0.1)
Basophils Relative: 0 %
Eosinophils Absolute: 0.1 10*3/uL (ref 0.0–0.5)
Eosinophils Relative: 1 %
HCT: 47.8 % (ref 39.0–52.0)
Hemoglobin: 17.1 g/dL — ABNORMAL HIGH (ref 13.0–17.0)
Immature Granulocytes: 0 %
Lymphocytes Relative: 37 %
Lymphs Abs: 3.2 10*3/uL (ref 0.7–4.0)
MCH: 33.6 pg (ref 26.0–34.0)
MCHC: 35.8 g/dL (ref 30.0–36.0)
MCV: 93.9 fL (ref 80.0–100.0)
Monocytes Absolute: 0.5 10*3/uL (ref 0.1–1.0)
Monocytes Relative: 6 %
Neutro Abs: 4.8 10*3/uL (ref 1.7–7.7)
Neutrophils Relative %: 56 %
Platelets: 325 10*3/uL (ref 150–400)
RBC: 5.09 MIL/uL (ref 4.22–5.81)
RDW: 11.4 % — ABNORMAL LOW (ref 11.5–15.5)
WBC: 8.6 10*3/uL (ref 4.0–10.5)
nRBC: 0 % (ref 0.0–0.2)

## 2021-03-30 LAB — COMPREHENSIVE METABOLIC PANEL
ALT: 40 U/L (ref 0–44)
AST: 27 U/L (ref 15–41)
Albumin: 4.8 g/dL (ref 3.5–5.0)
Alkaline Phosphatase: 59 U/L (ref 38–126)
Anion gap: 11 (ref 5–15)
BUN: 20 mg/dL (ref 6–20)
CO2: 24 mmol/L (ref 22–32)
Calcium: 9.5 mg/dL (ref 8.9–10.3)
Chloride: 103 mmol/L (ref 98–111)
Creatinine, Ser: 1.12 mg/dL (ref 0.61–1.24)
GFR, Estimated: >60 mL/min (ref >60)
Glucose, Bld: 117 mg/dL — ABNORMAL HIGH (ref 70–99)
Potassium: 3.4 mmol/L — ABNORMAL LOW (ref 3.5–5.1)
Sodium: 138 mmol/L (ref 135–145)
Total Bilirubin: 1 mg/dL (ref 0.3–1.2)
Total Protein: 8.1 g/dL (ref 6.5–8.1)

## 2021-03-30 LAB — URINALYSIS, ROUTINE W REFLEX MICROSCOPIC
Bilirubin Urine: NEGATIVE
Glucose, UA: NEGATIVE mg/dL
Ketones, ur: NEGATIVE mg/dL
Leukocytes,Ua: NEGATIVE
Nitrite: NEGATIVE
Protein, ur: NEGATIVE mg/dL
Specific Gravity, Urine: >1.03 — ABNORMAL HIGH (ref 1.005–1.030)
pH: 5 (ref 5.0–8.0)

## 2021-03-30 LAB — LIPASE, BLOOD: Lipase: 26 U/L (ref 11–51)

## 2021-03-30 LAB — URINALYSIS, MICROSCOPIC (REFLEX): RBC / HPF: >50 RBC/hpf (ref 0–5)

## 2021-03-30 MED ORDER — HYDROCODONE-ACETAMINOPHEN 5-325 MG PO TABS: 1.0000 | ORAL_TABLET | Freq: Four times a day (QID) | ORAL | 0 refills | Status: DC | PRN

## 2021-03-30 MED ORDER — ONDANSETRON 4 MG PO TBDP: 4.0000 mg | ORAL_TABLET | Freq: Three times a day (TID) | ORAL | 0 refills | Status: DC | PRN

## 2021-03-30 MED ORDER — KETOROLAC TROMETHAMINE 30 MG/ML IJ SOLN: 30.0000 mg | Freq: Once | INTRAMUSCULAR | Status: DC

## 2021-03-30 MED ORDER — NAPROXEN 500 MG PO TABS: 500.0000 mg | ORAL_TABLET | Freq: Two times a day (BID) | ORAL | 0 refills | Status: DC

## 2021-03-30 MED ORDER — OXYCODONE-ACETAMINOPHEN 5-325 MG PO TABS
1.0000 | ORAL_TABLET | Freq: Once | ORAL | Status: AC
  Administered 2021-03-30: 1 via ORAL
  Filled 2021-03-30: qty 1

## 2021-03-30 MED ORDER — HYDROMORPHONE HCL 1 MG/ML IJ SOLN
1.0000 mg | Freq: Once | INTRAMUSCULAR | Status: AC
  Administered 2021-03-30: 1 mg via INTRAVENOUS
  Filled 2021-03-30: qty 1

## 2021-03-30 MED ORDER — KETOROLAC TROMETHAMINE 15 MG/ML IJ SOLN
15.0000 mg | Freq: Once | INTRAMUSCULAR | Status: AC
  Administered 2021-03-30: 15 mg via INTRAVENOUS
  Filled 2021-03-30: qty 1

## 2021-03-30 MED ORDER — SODIUM CHLORIDE 0.9 % IV BOLUS
1000.0000 mL | Freq: Once | INTRAVENOUS | Status: AC
  Administered 2021-03-30: 1000 mL via INTRAVENOUS

## 2021-03-30 MED ORDER — ONDANSETRON HCL 4 MG/2ML IJ SOLN
4.0000 mg | Freq: Once | INTRAMUSCULAR | Status: AC
  Administered 2021-03-30: 4 mg via INTRAVENOUS
  Filled 2021-03-30: qty 2

## 2021-03-30 MED ORDER — KETOROLAC TROMETHAMINE 30 MG/ML IJ SOLN
15.0000 mg | Freq: Once | INTRAMUSCULAR | Status: AC
  Administered 2021-03-30: 15 mg via INTRAVENOUS
  Filled 2021-03-30: qty 1

## 2021-03-30 NOTE — Telephone Encounter (Signed)
Patient called and spoke with Tammy asking for urgent appointment, then hang up because he felt like he was going to throw up. Called pt back, he stated that he has a severe pain at the lower of his right quadrant, sweating, Nauseated; painful to urinate and has a frequency need to use the bathroom.  I advise pt to go to ER for evaluation as this may be related to a kidney stone and he said he had a lot of them in the past and he is on his way to ER right now.

## 2021-03-30 NOTE — ED Triage Notes (Signed)
Pt c/o right flank/abd pain x today-states feels like stone kidney stone-NAD-steady gait

## 2021-03-30 NOTE — Discharge Instructions (Signed)
As discussed, your pain is most likely related to a kidney stone.  I am sending home with pain medication and nausea medication.  Take as needed.  I have included the number of the urologist.  Please call to schedule point for further evaluation. Continue to drink extra fluids over the next few days. Return to the ER for new or worsening symptoms.

## 2021-03-30 NOTE — ED Notes (Signed)
Pt given pain meds - given urinal and advised to chagne positions slowly r/t meds. Has call bell and advised to used if needed to ambulate to the bathroom SO @ BS

## 2021-03-30 NOTE — ED Provider Notes (Signed)
[facility] EMERGENCY DEPARTMENT Provider Note   CSN: [id number] Arrival date & time: 03/30/21  1536     History Chief Complaint  Patient presents with  . Flank Pain    Ethan Knox is a 50 y.o. male with a past medical history significant for depression, hypertension, and history of kidney stones who presents to the ED due to right flank pain that radiates to right lower abdomen that started earlier today. Patient states it feels similar to his past kidney stones. He denies any history of lithotripsy and notes he has passed roughly 20 kidney stones. Patient states his urine has been "darker" today, but denies gross hematuria. Denies fever and chills. Admits to diaphoresis due to pain. Denies chest pain and shortness of breath. No treatment prior to arrival.   History obtained from patient and past medical records. No interpreter used during encounter.       Past Medical History:  Diagnosis Date  . Depression   . HSV-2 (herpes simplex virus 2) infection   . Hypertension   . Kidney stone   . Sleep disorder, shift work     Patient Active Problem List   Diagnosis Date Noted  . Gastric nodule   . Heart murmur 01/11/2020  . Essential hypertension 12/14/2019  . Visit for suture removal 12/14/2019  . HSV-2 (herpes simplex virus 2) infection   . Sleep disorder, shift work     Past Surgical History:  Procedure Laterality Date  . BIOPSY  06/08/2020   Procedure: BIOPSY;  Surgeon: Milus Banister, MD;  Location: [facility];  Service: Endoscopy;;  . ESOPHAGOGASTRODUODENOSCOPY (EGD) WITH PROPOFOL N/A 06/08/2020   Procedure: ESOPHAGOGASTRODUODENOSCOPY (EGD) WITH PROPOFOL;  Surgeon: Milus Banister, MD;  Location: [facility];  Service: Endoscopy;  Laterality: N/A;  . EUS N/A 06/08/2020   Procedure: UPPER ENDOSCOPIC ULTRASOUND (EUS) RADIAL;  Surgeon: Milus Banister, MD;  Location: [facility];  Service: Endoscopy;  Laterality: N/A;  . VASECTOMY          Family History  Problem Relation Age of Onset  . Diabetes Father   . Arthritis Father        gout  . Heart disease Father   . Mental retardation Sister   . Heart disease Paternal Grandmother   . Colon cancer Neg Hx   . Esophageal cancer Neg Hx   . Pancreatic cancer Neg Hx   . Stomach cancer Neg Hx     Social History   Tobacco Use  . Smoking status: Current Some Day Smoker    Types: Cigars  . Smokeless tobacco: Never Used  Vaping Use  . Vaping Use: Never used  Substance Use Topics  . Alcohol use: Yes    Comment: occ  . Drug use: No    Home Medications Prior to Admission medications   Medication Sig Start Date End Date Taking? Authorizing Provider  HYDROcodone-acetaminophen (NORCO/VICODIN) 5-325 MG tablet Take 1 tablet by mouth every 6 (six) hours as needed. 03/30/21  Yes Janely Gullickson C, PA-C  naproxen (NAPROSYN) 500 MG tablet Take 1 tablet (500 mg total) by mouth 2 (two) times daily. 03/30/21  Yes Mollee Neer C, PA-C  ondansetron (ZOFRAN ODT) 4 MG disintegrating tablet Take 1 tablet (4 mg total) by mouth every 8 (eight) hours as needed for nausea or vomiting. 03/30/21  Yes Shanele Nissan, Druscilla Brownie, PA-C  cetirizine (ZYRTEC) 10 MG tablet Take 10 mg by mouth daily.    [provider]  DULoxetine (CYMBALTA) 60 MG  capsule Take 1 capsule (60 mg total) by mouth daily. 08/23/20   [name], MD  fluticasone (FLONASE) 50 MCG/ACT nasal spray Place 1 spray into both nostrils daily as needed for allergies or rhinitis.    [provider]  lisinopril-hydrochlorothiazide (ZESTORETIC) 20-12.5 MG tablet Take 1 tablet by mouth daily. 08/23/20   [name], MD  pantoprazole (PROTONIX) 40 MG tablet Take 1 tablet (40 mg total) by mouth 2 (two) times daily. Take bid for 8 weeks then 1 qd 01/12/21 03/13/21  Cirigliano, [name] V, DO  valACYclovir (VALTREX) 500 MG tablet Take 1 tablet (500 mg total) by mouth daily. 03/07/21   [name], MD  zolpidem  (AMBIEN) 10 MG tablet Take 0.5-1 tablets (5-10 mg total) by mouth at bedtime as needed for sleep. 09/13/19 09/21/20  [name], MD    Allergies    Prilosec [omeprazole magnesium]  Review of Systems   Review of Systems  Constitutional: Positive for diaphoresis. Negative for chills and fever.  Respiratory: Negative for shortness of breath.   Cardiovascular: Negative for chest pain.  Gastrointestinal: Positive for abdominal pain and nausea. Negative for diarrhea and vomiting.  Genitourinary: Positive for flank pain and hematuria.    Physical Exam Updated Vital Signs BP (!) 170/104   Pulse 60   Temp 97.7 F (36.5 C) (Oral)   Resp 18   Ht 6' 3"  (1.905 m)   Wt 108.9 kg   SpO2 97%   BMI 30.00 kg/m   Physical Exam Vitals and nursing note reviewed.  Constitutional:      General: He is not in acute distress.    Appearance: He is not ill-appearing.  HENT:     Head: Normocephalic.  Eyes:     Pupils: Pupils are equal, round, and reactive to light.  Cardiovascular:     Rate and Rhythm: Normal rate and regular rhythm.     Pulses: Normal pulses.     Heart sounds: Normal heart sounds. No murmur heard. No friction rub. No gallop.   Pulmonary:     Effort: Pulmonary effort is normal.     Breath sounds: Normal breath sounds.  Abdominal:     General: Abdomen is flat. There is no distension.     Palpations: Abdomen is soft.     Tenderness: There is no abdominal tenderness. There is no right CVA tenderness, left CVA tenderness, guarding or rebound.     Comments: Negative CVA tenderness.  Musculoskeletal:        General: Normal range of motion.     Cervical back: Neck supple.  Skin:    General: Skin is warm and dry.     Comments: No overlying rash to flank region.   Neurological:     General: No focal deficit present.     Mental Status: He is alert.  Psychiatric:        Mood and Affect: Mood normal.        Behavior: Behavior normal.     ED Results / Procedures /  Treatments   Labs (all labs ordered are listed, but only abnormal results are displayed) Labs Reviewed  URINALYSIS, ROUTINE W REFLEX MICROSCOPIC - Abnormal; Notable for the following components:      Result Value   APPearance CLOUDY (*)    Specific Gravity, Urine >1.030 (*)    Hgb urine dipstick LARGE (*)    All other components within normal limits  URINALYSIS, MICROSCOPIC (REFLEX) - Abnormal; Notable for the following components:  Bacteria, UA RARE (*)    All other components within normal limits  CBC WITH DIFFERENTIAL/PLATELET - Abnormal; Notable for the following components:   Hemoglobin 17.1 (*)    RDW 11.4 (*)    All other components within normal limits  COMPREHENSIVE METABOLIC PANEL - Abnormal; Notable for the following components:   Potassium 3.4 (*)    Glucose, Bld 117 (*)    All other components within normal limits  LIPASE, BLOOD    EKG None  Radiology US Renal  Result Date: 03/30/2021 CLINICAL DATA:  Right flank pain EXAM: RENAL / URINARY TRACT ULTRASOUND COMPLETE COMPARISON:  None. FINDINGS: Right Kidney: Renal measurements: 14 x 5.4 by 6.3 cm = volume: 252.2 mL. Echogenicity within normal limits. Mild right hydronephrosis. No mass Left Kidney: Renal measurements: 13 x 4.8 x 5.3 cm = volume: 172.2 mL. Echogenicity within normal limits. No mass or hydronephrosis visualized. Bladder: Appears normal for degree of bladder distention. Other: None. IMPRESSION: 1. Mild right hydronephrosis. If distal stone disease is suspected, would correlate with CT KUB 2. Otherwise negative renal ultrasound Electronically Signed   By: Donavan Foil M.D.   On: 03/30/2021 17:41    Procedures Procedures   Medications Ordered in ED Medications  oxyCODONE-acetaminophen (PERCOCET/ROXICET) 5-325 MG per tablet 1 tablet (1 tablet Oral Given 03/30/21 1623)  ketorolac (TORADOL) 30 MG/ML injection 15 mg (15 mg Intravenous Given 03/30/21 1621)  ondansetron (ZOFRAN) injection 4 mg (4 mg Intravenous  Given 03/30/21 1620)  HYDROmorphone (DILAUDID) injection 1 mg (1 mg Intravenous Given 03/30/21 1813)  sodium chloride 0.9 % bolus 1,000 mL (0 mLs Intravenous Stopped 03/30/21 1920)  ketorolac (TORADOL) 15 MG/ML injection 15 mg (15 mg Intravenous Given 03/30/21 2004)    ED Course  I have reviewed the triage vital signs and the nursing notes.  Pertinent labs & imaging results that were available during my care of the patient were reviewed by me and considered in my medical decision making (see chart for details).  Clinical Course as of 03/30/21 2023  Fri Mar 30, 2021  1603 Hgb urine dipstick(!): LARGE [CA]  1710 Potassium(!): 3.4 [CA]    Clinical Course User Index [CA] Suzy Bouchard, PA-C   MDM Rules/Calculators/A&P                         50 year old male presents to the ED due to right flank pain.  Patient has a history of numerous uncomplicated kidney stones and states it feels similar. No history of lithotripsy.  Upon arrival, patient afebrile, bradycardic at 55 with elevated BP at 194/113 likely due to pain.  Patient denies chest pain, headache, blurry vision.  Low suspicion for hypertensive emergency/urgency.  Will continue to monitor blood pressure after pain is controlled.  Physical exam benign.  Abdomen soft, nondistended, nontender.  Low suspicion for acute abdomen.  Given patient denies chest pain and shortness of breath.  Low suspicion for dissection.  No overlying rash to suggest shingles.  UA ordered at triage.  Routine labs ordered to rule out electrolyte derangements.  Given patient has a history of over 20 kidney stones with no history of lithotripsy will obtain renal ultrasound to rule out hydronephrosis.  Discussed case with Dr. Joya Gaskins who agrees with assessment and plan.  Toradol, Zofran, given for Percocet given for pain management.  UA significant for large hematuria.  No signs of infection.  Low suspicion for infected kidney stone.  CBC reassuring with no leukocytosis.  Lipase normal at 26.  CMP significant for mild hypokalemia 3.4 and hyperglycemia at 117.  No anion gap.  Normal renal function. Renal US personally reviewed which demonstrates: IMPRESSION:  1. Mild right hydronephrosis. If distal stone disease is suspected,  would correlate with CT KUB  2. Otherwise negative renal ultrasound   6:08 PM reassessed patient at bedside and notes mild improvement in pain (10/10>6/10). Dilaudid and IVFs given.   Patient admits to improvement in pain after Dilaudid. Patient discharged with symptomatic treatment. Urology number given at discharge. Strict ED precautions discussed with patient. Patient states understanding and agrees to plan. Patient discharged home in no acute distress and stable vitals. Final Clinical Impression(s) / ED Diagnoses Final diagnoses:  Right flank pain    Rx / DC Orders ED Discharge Orders         Ordered    naproxen (NAPROSYN) 500 MG tablet  2 times daily        03/30/21 2023    ondansetron (ZOFRAN ODT) 4 MG disintegrating tablet  Every 8 hours PRN        03/30/21 2023    HYDROcodone-acetaminophen (NORCO/VICODIN) 5-325 MG tablet  Every 6 hours PRN        03/30/21 2023           Karie Kirks 03/30/21 2345    Arnaldo Natal, MD 04/02/21 1900

## 2021-03-30 NOTE — ED Notes (Signed)
Pt given blanket x2 per request

## 2021-04-03 NOTE — Telephone Encounter (Signed)
Agree with plan and ER visit noted.

## 2021-05-16 ENCOUNTER — Other Ambulatory Visit: Payer: Self-pay | Admitting: Family Medicine

## 2021-05-16 DIAGNOSIS — I10 Essential (primary) hypertension: Secondary | ICD-10-CM

## 2021-08-03 ENCOUNTER — Telehealth: Payer: Self-pay

## 2021-08-03 ENCOUNTER — Other Ambulatory Visit: Payer: Self-pay

## 2021-08-03 DIAGNOSIS — K3189 Other diseases of stomach and duodenum: Secondary | ICD-10-CM

## 2021-08-03 NOTE — Telephone Encounter (Signed)
Left message on machine to call back  

## 2021-08-03 NOTE — Telephone Encounter (Signed)
Error appt time is 11 am to accommodate [facility] endo clean up time

## 2021-08-03 NOTE — Telephone Encounter (Signed)
EUS has been scheduled for 08/30/21 at [facility] with DJ at 1030 am for gastric nodule

## 2021-08-06 NOTE — Telephone Encounter (Signed)
EUS scheduled, pt instructed and medications reviewed.  Patient instructions mailed to home.  Patient to call with any questions or concerns.  

## 2021-08-21 ENCOUNTER — Encounter (HOSPITAL_COMMUNITY): Payer: Self-pay | Admitting: Gastroenterology

## 2021-08-30 ENCOUNTER — Telehealth: Payer: Self-pay

## 2021-08-30 ENCOUNTER — Encounter (HOSPITAL_COMMUNITY): Payer: Self-pay | Admitting: Gastroenterology

## 2021-08-30 ENCOUNTER — Ambulatory Visit (HOSPITAL_COMMUNITY): Payer: 59 | Admitting: Anesthesiology

## 2021-08-30 ENCOUNTER — Encounter (HOSPITAL_COMMUNITY)
Admission: RE | Disposition: A | Discharge status: Home / Self Care | Payer: Self-pay | Source: Home / Self Care | Attending: Gastroenterology

## 2021-08-30 ENCOUNTER — Other Ambulatory Visit: Payer: Self-pay

## 2021-08-30 ENCOUNTER — Ambulatory Visit (HOSPITAL_COMMUNITY)
Admission: RE | Admit: 2021-08-30 | Discharge: 2021-08-30 | Disposition: A | Discharge status: Home / Self Care | Payer: 59 | Attending: Gastroenterology | Admitting: Gastroenterology

## 2021-08-30 DIAGNOSIS — Z8249 Family history of ischemic heart disease and other diseases of the circulatory system: Secondary | ICD-10-CM | POA: Insufficient documentation

## 2021-08-30 DIAGNOSIS — Z833 Family history of diabetes mellitus: Secondary | ICD-10-CM | POA: Diagnosis not present

## 2021-08-30 DIAGNOSIS — K3189 Other diseases of stomach and duodenum: Secondary | ICD-10-CM | POA: Insufficient documentation

## 2021-08-30 DIAGNOSIS — Z8261 Family history of arthritis: Secondary | ICD-10-CM | POA: Diagnosis not present

## 2021-08-30 DIAGNOSIS — K219 Gastro-esophageal reflux disease without esophagitis: Secondary | ICD-10-CM | POA: Insufficient documentation

## 2021-08-30 DIAGNOSIS — Z888 Allergy status to other drugs, medicaments and biological substances status: Secondary | ICD-10-CM | POA: Diagnosis not present

## 2021-08-30 DIAGNOSIS — K929 Disease of digestive system, unspecified: Secondary | ICD-10-CM | POA: Diagnosis not present

## 2021-08-30 DIAGNOSIS — F17290 Nicotine dependence, other tobacco product, uncomplicated: Secondary | ICD-10-CM | POA: Diagnosis not present

## 2021-08-30 HISTORY — PX: ESOPHAGOGASTRODUODENOSCOPY (EGD) WITH PROPOFOL: SHX5813

## 2021-08-30 HISTORY — PX: EUS: SHX5427

## 2021-08-30 SURGERY — UPPER ENDOSCOPIC ULTRASOUND (EUS) RADIAL
Anesthesia: Monitor Anesthesia Care

## 2021-08-30 MED ORDER — LACTATED RINGERS IV SOLN: INTRAVENOUS | Status: DC

## 2021-08-30 MED ORDER — LACTATED RINGERS IV SOLN
INTRAVENOUS | Status: AC | PRN
  Administered 2021-08-30: 1000 mL via INTRAVENOUS

## 2021-08-30 MED ORDER — PROPOFOL 10 MG/ML IV BOLUS
INTRAVENOUS | Status: DC | PRN
  Administered 2021-08-30 (×2): 20 mg via INTRAVENOUS

## 2021-08-30 MED ORDER — SODIUM CHLORIDE 0.9 % IV SOLN: INTRAVENOUS | Status: DC

## 2021-08-30 MED ORDER — PROMETHAZINE HCL 25 MG/ML IJ SOLN: 6.2500 mg | INTRAMUSCULAR | Status: DC | PRN

## 2021-08-30 MED ORDER — PROPOFOL 500 MG/50ML IV EMUL
INTRAVENOUS | Status: DC | PRN
  Administered 2021-08-30: 160 ug/kg/min via INTRAVENOUS

## 2021-08-30 NOTE — Anesthesia Postprocedure Evaluation (Signed)
Anesthesia Post Note  Patient: Ethan Knox  Procedure(s) Performed: UPPER ENDOSCOPIC ULTRASOUND (EUS) RADIAL     Patient location during evaluation: PACU Anesthesia Type: MAC Level of consciousness: awake and alert and oriented Pain management: pain level controlled Vital Signs Assessment: post-procedure vital signs reviewed and stable Respiratory status: spontaneous breathing, nonlabored ventilation and respiratory function stable Cardiovascular status: blood pressure returned to baseline Postop Assessment: no apparent nausea or vomiting Anesthetic complications: no   No notable events documented.  Last Vitals:  Vitals:   08/30/21 1130 08/30/21 1135  BP: 140/82   Pulse: (!) 51 (!) 51  Resp: 14 13  Temp:    SpO2: 97% 96%    Last Pain:  Vitals:   08/30/21 1130  TempSrc:   PainSc: 0-No pain                 Marthenia Rolling

## 2021-08-30 NOTE — Transfer of Care (Signed)
Immediate Anesthesia Transfer of Care Note  Patient: Ethan Knox  Procedure(s) Performed: UPPER ENDOSCOPIC ULTRASOUND (EUS) RADIAL  Patient Location: PACU  Anesthesia Type:MAC  Level of Consciousness: sedated  Airway & Oxygen Therapy: Patient Spontanous Breathing and Patient connected to face mask oxygen  Post-op Assessment: Report given to RN and Post -op Vital signs reviewed and stable  Post vital signs: Reviewed and stable  Last Vitals:  Vitals Value Taken Time  BP    Temp    Pulse 61 08/30/21 1108  Resp 23 08/30/21 1108  SpO2 100 % 08/30/21 1108  Vitals shown include unvalidated device data.  Last Pain:  Vitals:   08/30/21 1007  TempSrc: Oral  PainSc: 0-No pain         Complications: No notable events documented.

## 2021-08-30 NOTE — Discharge Instructions (Signed)
YOU HAD AN ENDOSCOPIC PROCEDURE TODAY: Refer to the procedure report and other information in the discharge instructions given to you for any specific questions about what was found during the examination. If this information does not answer your questions, please call [place] office at [phone] to clarify.  ° °YOU SHOULD EXPECT: Some feelings of bloating in the abdomen. Passage of more gas than usual. Walking can help get rid of the air that was put into your GI tract during the procedure and reduce the bloating. If you had a lower endoscopy (such as a colonoscopy or flexible sigmoidoscopy) you may notice spotting of blood in your stool or on the toilet paper. Some abdominal soreness may be present for a day or two, also. ° °DIET: Your first meal following the procedure should be a light meal and then it is ok to progress to your normal diet. A half-sandwich or bowl of soup is an example of a good first meal. Heavy or fried foods are harder to digest and may make you feel nauseous or bloated. Drink plenty of fluids but you should avoid alcoholic beverages for 24 hours. If you had a esophageal dilation, please see attached instructions for diet.   ° °ACTIVITY: Your care partner should take you home directly after the procedure. You should plan to take it easy, moving slowly for the rest of the day. You can resume normal activity the day after the procedure however YOU SHOULD NOT DRIVE, use power tools, machinery or perform tasks that involve climbing or major physical exertion for 24 hours (because of the sedation medicines used during the test).  ° °SYMPTOMS TO REPORT IMMEDIATELY: °A gastroenterologist can be reached at any hour. Please call [phone]  for any of the following symptoms:  °Following lower endoscopy (colonoscopy, flexible sigmoidoscopy) °Excessive amounts of blood in the stool  °Significant tenderness, worsening of abdominal pains  °Swelling of the abdomen that is new, acute  °Fever of 100° or  higher  °Following upper endoscopy (EGD, EUS, ERCP, esophageal dilation) °Vomiting of blood or coffee ground material  °New, significant abdominal pain  °New, significant chest pain or pain under the shoulder blades  °Painful or persistently difficult swallowing  °New shortness of breath  °Black, tarry-looking or red, bloody stools ° °FOLLOW UP:  °If any biopsies were taken you will be contacted by phone or by letter within the next 1-3 weeks. Call [phone]  if you have not heard about the biopsies in 3 weeks.  °Please also call with any specific questions about appointments or follow up tests. ° °

## 2021-08-30 NOTE — Op Note (Signed)
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 Loletha Carrow                            EGD Providers:                Milus Banister, MD, Brooke Person, Frazier Richards,                            Technician, Cletis Athens, Technician Referring MD:             Wilfrid Lund, MD Medicines:                Monitored Anesthesia Care Complications:            No immediate complications. Estimated blood loss:                            None. Estimated Blood Loss:     Estimated blood loss: none. Procedure:                Pre-Anesthesia Assessment:                           - Prior to the procedure, a History and Physical                            was performed, and patient medications and                            allergies were reviewed. The patient's tolerance of                            previous anesthesia was also reviewed. The risks                            and benefits of the procedure and the sedation                            options and risks were discussed with the patient.                            All questions were answered, and informed consent                            was obtained. Prior Anticoagulants: The patient has                            taken no previous anticoagulant or antiplatelet                            agents. ASA  Grade Assessment: II - A patient with                            mild systemic disease. After reviewing the risks                            and benefits, the patient was deemed in                            satisfactory condition to undergo the procedure.                           After obtaining informed consent, the endoscope was                            passed under direct  vision. Throughout the                            procedure, the patient's blood pressure, pulse, and                            oxygen saturations were monitored continuously. The                            [id number] ([id number]) Olympus radial ultrasound                            scope was introduced through the mouth, and                            advanced to the second part of duodenum. The upper                            EUS was accomplished without difficulty. The                            patient tolerated the procedure well. Scope In: Scope Out: Findings:      ENDOSCOPIC FINDING: :      The examined esophagus was endoscopically normal.      Small, subCM, round subepithelial lesion in the gastric fundus, about       5-6cm distal to the GE junction      The examined duodenum was endoscopically normal.      ENDOSONOGRAPHIC FINDING: :      1. The subepithelial lesion described above correlated with an 8.68m by       5.162mround, hypoechoic, homogeneous mass that clearly communicated with       the muscularis propria layer of the gastric wall.      2. The gastric wall was otherwise normal.      3. No perigastric adenopathy.      4. Limited views of the pancreas, liver, spleen, portal and splenic       vessels were all normal. 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                           - Repeat biopsies were not taken today. Moderate Sedation:      Not Applicable - Patient had care per Anesthesia. Recommendation:           - Discharge patient to home.                           - Repeat EUS evaluation in 2 years. Procedure Code(s):        --- Professional ---                           [id number]y, flexible,                            transoral; with endoscopic ultrasound examination                            limited to the esophagus, stomach or duodenum, and                            adjacent structures Diagnosis Code(s):        --- Professional ---                           K31.89, Other diseases of stomach and duodenum                           K92.9, Disease of digestive system, unspecified CPT copyright 2019 [employer] Association. All rights reserved. The codes documented in this report are preliminary and upon coder review may  be revised to meet current compliance requirements. DaniMilus Banister 08/30/2021 11:21:31 AM This report has been signed electronically. Number of Addenda: 0

## 2021-08-30 NOTE — Telephone Encounter (Signed)
-----   Message from Milus Banister, MD sent at 08/30/2021 11:22 AM EDT ----- Mallie Mussel,  Just completed repeat EUS.  See full report in epic.  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 - Repeat biopsies were not taken today.   Jeral Zick, He needs recall EUS in 2 years.   Thanks all

## 2021-08-30 NOTE — Telephone Encounter (Signed)
Recall has been entered as requested

## 2021-08-30 NOTE — Anesthesia Preprocedure Evaluation (Addendum)
Anesthesia Evaluation  Patient identified by MRN, date of birth, ID band Patient awake    Reviewed: Allergy & Precautions, NPO status , Patient's Chart, lab work & pertinent test results  History of Anesthesia Complications Negative for: history of anesthetic complications  Airway Mallampati: II  TM Distance: >3 FB Neck ROM: Full    Dental no notable dental hx.    Pulmonary Current Smoker,    Pulmonary exam normal        Cardiovascular hypertension, Pt. on medications Normal cardiovascular exam     Neuro/Psych Depression negative neurological ROS     GI/Hepatic Neg liver ROS, GERD  Medicated and Controlled,gastric nodule   Endo/Other  negative endocrine ROS  Renal/GU negative Renal ROS  negative genitourinary   Musculoskeletal negative musculoskeletal ROS (+)   Abdominal   Peds  Hematology negative hematology ROS (+)   Anesthesia Other Findings Day of surgery medications reviewed with patient.  Reproductive/Obstetrics negative OB ROS                            Anesthesia Physical Anesthesia Plan  ASA: 2  Anesthesia Plan: MAC   Post-op Pain Management:    Induction:   PONV Risk Score and Plan: Treatment may vary due to age or medical condition and Propofol infusion  Airway Management Planned: Natural Airway and Nasal Cannula  Additional Equipment:   Intra-op Plan:   Post-operative Plan:   Informed Consent: I have reviewed the patients History and Physical, chart, labs and discussed the procedure including the risks, benefits and alternatives for the proposed anesthesia with the patient or authorized representative who has indicated his/her understanding and acceptance.       Plan Discussed with: CRNA  Anesthesia Plan Comments:        Anesthesia Quick Evaluation

## 2021-08-30 NOTE — H&P (Signed)
HPI: This is a man with a gastric nodule   ROS: complete GI ROS as described in HPI, all other review negative.  Constitutional:  No unintentional weight loss   Past Medical History:  Diagnosis Date   Depression    HSV-2 (herpes simplex virus 2) infection    Hypertension    Kidney stone    Sleep disorder, shift work     Past Surgical History:  Procedure Laterality Date   BIOPSY  06/08/2020   Procedure: BIOPSY;  Surgeon: Milus Banister, MD;  Location: [facility];  Service: Endoscopy;;   ESOPHAGOGASTRODUODENOSCOPY (EGD) WITH PROPOFOL N/A 06/08/2020   Procedure: ESOPHAGOGASTRODUODENOSCOPY (EGD) WITH PROPOFOL;  Surgeon: Milus Banister, MD;  Location: [facility];  Service: Endoscopy;  Laterality: N/A;   EUS N/A 06/08/2020   Procedure: UPPER ENDOSCOPIC ULTRASOUND (EUS) RADIAL;  Surgeon: Milus Banister, MD;  Location: [facility];  Service: Endoscopy;  Laterality: N/A;   VASECTOMY      Current Facility-Administered Medications  Medication Dose Route Frequency Provider Last Rate Last Admin   0.9 %  sodium chloride infusion   Intravenous Continuous Milus Banister, MD       lactated ringers infusion   Intravenous Continuous Milus Banister, MD       lactated ringers infusion    Continuous PRN Milus Banister, MD 10 mL/hr at 08/30/21 1023 Continued from Pre-op at 08/30/21 1023   promethazine (PHENERGAN) injection 6.25-12.5 mg  6.25-12.5 mg Intravenous Q15 min PRN Brennan Bailey, MD        Allergies as of 08/03/2021 - Review Complete 03/30/2021  Allergen Reaction Noted   Prilosec [omeprazole magnesium] Rash 01/23/2012    Family History  Problem Relation Age of Onset   Diabetes Father    Arthritis Father        gout   Heart disease Father    Mental retardation Sister    Heart disease Paternal Grandmother    Colon cancer Neg Hx    Esophageal cancer Neg Hx    Pancreatic cancer Neg Hx    Stomach cancer Neg Hx     Social History   Socioeconomic History    Marital status: Married    Spouse name: [name]   Number of children: 1   Years of education: 14   Highest education level: Not on file  Occupational History   Occupation: Engineer, structural  Tobacco Use   Smoking status: Some Days    Types: Cigars   Smokeless tobacco: Never  Vaping Use   Vaping Use: Never used  Substance and Sexual Activity   Alcohol use: Yes    Comment: occ   Drug use: No   Sexual activity: Not on file  Other Topics Concern   Not on file  Social History Narrative   Lives with his wife.  His 43 year old son lives in [place].   Social Determinants of Health   Financial Resource Strain: Not on file  Food Insecurity: Not on file  Transportation Needs: Not on file  Physical Activity: Not on file  Stress: Not on file  Social Connections: Not on file  Intimate Partner Violence: Not on file     Physical Exam: BP (!) 161/90   Pulse (!) 49   Temp 98.5 F (36.9 C) (Oral)   Resp 14   Ht 6' 3"  (1.905 m)   Wt 104.3 kg   SpO2 99%   BMI 28.75 kg/m  Constitutional: generally well-appearing Psychiatric: alert and oriented x3  Abdomen: soft, nontender, nondistended, no obvious ascites, no peritoneal signs, normal bowel sounds No peripheral edema noted in lower extremities  Assessment and plan: 50 y.o. male with gastric nodule  Surveillance EUS today  Please see the "Patient Instructions" section for addition details about the plan.  Owens Loffler, MD [facility] 08/30/2021, 10:32 AM

## 2021-08-31 ENCOUNTER — Encounter (HOSPITAL_COMMUNITY): Payer: Self-pay | Admitting: Gastroenterology

## 2021-09-10 ENCOUNTER — Other Ambulatory Visit: Payer: Self-pay | Admitting: Family Medicine

## 2021-09-10 DIAGNOSIS — F32A Depression, unspecified: Secondary | ICD-10-CM

## 2021-09-12 ENCOUNTER — Ambulatory Visit: Payer: 59 | Admitting: Family Medicine

## 2021-09-12 ENCOUNTER — Encounter: Payer: Self-pay | Admitting: Registered Nurse

## 2021-09-12 ENCOUNTER — Other Ambulatory Visit: Payer: Self-pay

## 2021-09-12 ENCOUNTER — Telehealth (INDEPENDENT_AMBULATORY_CARE_PROVIDER_SITE_OTHER): Payer: 59 | Admitting: Registered Nurse

## 2021-09-12 DIAGNOSIS — G4726 Circadian rhythm sleep disorder, shift work type: Secondary | ICD-10-CM

## 2021-09-12 DIAGNOSIS — B009 Herpesviral infection, unspecified: Secondary | ICD-10-CM

## 2021-09-12 DIAGNOSIS — I10 Essential (primary) hypertension: Secondary | ICD-10-CM | POA: Diagnosis not present

## 2021-09-12 DIAGNOSIS — F32A Depression, unspecified: Secondary | ICD-10-CM | POA: Diagnosis not present

## 2021-09-12 DIAGNOSIS — K219 Gastro-esophageal reflux disease without esophagitis: Secondary | ICD-10-CM | POA: Diagnosis not present

## 2021-09-12 MED ORDER — LISINOPRIL-HYDROCHLOROTHIAZIDE 20-12.5 MG PO TABS: 1.0000 | ORAL_TABLET | Freq: Every day | ORAL | 1 refills | Status: DC

## 2021-09-12 MED ORDER — PANTOPRAZOLE SODIUM 40 MG PO TBEC: 40.0000 mg | DELAYED_RELEASE_TABLET | Freq: Every day | ORAL | 3 refills | Status: DC

## 2021-09-12 MED ORDER — DULOXETINE HCL 60 MG PO CPEP: 60.0000 mg | ORAL_CAPSULE | Freq: Every day | ORAL | 3 refills | Status: DC

## 2021-09-12 MED ORDER — VALACYCLOVIR HCL 500 MG PO TABS: 500.0000 mg | ORAL_TABLET | Freq: Every day | ORAL | 3 refills | Status: DC

## 2021-09-12 MED ORDER — ZOLPIDEM TARTRATE 10 MG PO TABS: 5.0000 mg | ORAL_TABLET | Freq: Every evening | ORAL | 0 refills | Status: DC | PRN

## 2021-09-12 NOTE — Progress Notes (Signed)
Telemedicine Encounter- SOAP NOTE Established Patient  This telephone encounter was conducted with the patient's (or proxy's) verbal consent via audio telecommunications: yes/no: Yes Patient was instructed to have this encounter in a suitably private space; and to only have persons present to whom they give permission to participate. In addition, patient identity was confirmed by use of name plus two identifiers (DOB and address).  I discussed the limitations, risks, security and privacy concerns of performing an evaluation and management service by telephone and the availability of in person appointments. I also discussed with the patient that there may be a patient responsible charge related to this service. The patient expressed understanding and agreed to proceed.  I spent a total of 16 talking with the patient or their proxy.  Patient at home Provider in office  Participants: Ethan Ruddy, NP and Ethan Knox  Chief Complaint  Patient presents with   Hypertension    Pt needs lisinopril HCTZ pt denies physical sxs, pt does not check BP at home    Gastroesophageal Reflux    Needs refill protonix no issues    Medication Refill    Pt needs refill valtrex and cymbalta doing well     Subjective   Ethan Knox is a 50 y.o. established patient. Telephone visit today for med refill  HPI Hypertension: Patient Currently taking: lisin-hctz 20-12.54m po qd, has run out some time ago. Good effect. No AEs. Denies CV symptoms including: chest pain, shob, doe, headache, visual changes, fatigue, claudication, and dependent edema.   Previous readings and labs: BP Readings from Last 3 Encounters:  08/30/21 140/82  03/30/21 (!) 170/104  09/21/20 122/68   Lab Results  Component Value Date   CREATININE 1.12 03/30/2021   GERD Pantoprazole 491mpo qd Follows with Dr. JaArdis HughsI for stomach nodule, had endoscopy on 08/30/21.  Tries to avoid trigger foods and lifestyle  factors that influence GERD.  Sleep disturbance secondary to shift work sleep disorder Ongoing. Occasional zolpidem use. Per PDMP, last fill for 30 tabs of 10 mg on 02/10/20 Pt reports he recently ran out with odd hours for halloween. Would like refill. He is very aware of risks of this medication and is aware not to use it when working, driving, operating machinery, or when in possession of a firearm.  Anxiety Duloxetine 6032mo qd Good effect, no AE Hopes to continue Denies hi/si  HSV Daily valtrex 500m83mod effect, no AE No breakthrough flares   Patient Active Problem List   Diagnosis Date Noted   Gastric nodule    Heart murmur 01/11/2020   Essential hypertension 12/14/2019   Visit for suture removal 12/14/2019   HSV-2 (herpes simplex virus 2) infection    Sleep disorder, shift work     Past Medical History:  Diagnosis Date   Depression    HSV-2 (herpes simplex virus 2) infection    Hypertension    Kidney stone    Sleep disorder, shift work     Current Outpatient Medications  Medication Sig Dispense Refill   cetirizine (ZYRTEC) 10 MG tablet Take 10 mg by mouth daily.     DULoxetine (CYMBALTA) 60 MG capsule Take 1 capsule (60 mg total) by mouth daily. 90 capsule 3   fluticasone (FLONASE) 50 MCG/ACT nasal spray Place 1 spray into both nostrils daily as needed for allergies or rhinitis.     HYDROcodone-acetaminophen (NORCO/VICODIN) 5-325 MG tablet Take 1 tablet by mouth every 6 (six) hours as needed. (Patient not  taking: No sig reported) 10 tablet 0   lisinopril-hydrochlorothiazide (ZESTORETIC) 20-12.5 MG tablet Take 1 tablet by mouth daily. 90 tablet 1   naproxen (NAPROSYN) 500 MG tablet Take 1 tablet (500 mg total) by mouth 2 (two) times daily. 30 tablet 0   ondansetron (ZOFRAN ODT) 4 MG disintegrating tablet Take 1 tablet (4 mg total) by mouth every 8 (eight) hours as needed for nausea or vomiting. (Patient not taking: Reported on 08/28/2021) 20 tablet 0    pantoprazole (PROTONIX) 40 MG tablet Take 1 tablet (40 mg total) by mouth daily. 90 tablet 3   valACYclovir (VALTREX) 500 MG tablet Take 1 tablet (500 mg total) by mouth daily. 90 tablet 3   zolpidem (AMBIEN) 10 MG tablet Take 0.5 tablets (5 mg total) by mouth at bedtime as needed for sleep. 30 tablet 0   No current facility-administered medications for this visit.    Allergies  Allergen Reactions   Prilosec [Omeprazole Magnesium] Rash    Social History   Socioeconomic History   Marital status: Married    Spouse name: [name]   Number of children: 1   Years of education: 14   Highest education level: Not on file  Occupational History   Occupation: Engineer, structural  Tobacco Use   Smoking status: Some Days    Types: Cigars   Smokeless tobacco: Never  Vaping Use   Vaping Use: Never used  Substance and Sexual Activity   Alcohol use: Yes    Comment: occ   Drug use: No   Sexual activity: Not on file  Other Topics Concern   Not on file  Social History Narrative   Lives with his wife.  His 53 year old son lives in [place].   Social Determinants of Health   Financial Resource Strain: Not on file  Food Insecurity: Not on file  Transportation Needs: Not on file  Physical Activity: Not on file  Stress: Not on file  Social Connections: Not on file  Intimate Partner Violence: Not on file    Review of Systems  Constitutional: Negative.   HENT: Negative.    Eyes: Negative.   Respiratory: Negative.    Cardiovascular: Negative.   Gastrointestinal: Negative.   Genitourinary: Negative.   Musculoskeletal: Negative.   Skin: Negative.   Neurological: Negative.   Endo/Heme/Allergies: Negative.   Psychiatric/Behavioral: Negative.    All other systems reviewed and are negative.  Objective   Vitals as reported by the patient: There were no vitals filed for this visit.  Amaziah was seen today for hypertension, gastroesophageal reflux and medication refill.  Diagnoses and  all orders for this visit:  Gastroesophageal reflux disease, unspecified whether esophagitis present -     pantoprazole (PROTONIX) 40 MG tablet; Take 1 tablet (40 mg total) by mouth daily.  Essential hypertension -     lisinopril-hydrochlorothiazide (ZESTORETIC) 20-12.5 MG tablet; Take 1 tablet by mouth daily.  Depression, unspecified depression type -     DULoxetine (CYMBALTA) 60 MG capsule; Take 1 capsule (60 mg total) by mouth daily.  HSV infection -     valACYclovir (VALTREX) 500 MG tablet; Take 1 tablet (500 mg total) by mouth daily.  Sleep disorder, shift work -     zolpidem (AMBIEN) 10 MG tablet; Take 0.5 tablets (5 mg total) by mouth at bedtime as needed for sleep.  PLAN Refill meds as above Present for BP check in 2-3 weeks NURSE VISIT Return as scheduled in February Patient encouraged to call clinic with any  questions, comments, or concerns.   I discussed the assessment and treatment plan with the patient. The patient was provided an opportunity to ask questions and all were answered. The patient agreed with the plan and demonstrated an understanding of the instructions.   The patient was advised to call back or seek an in-person evaluation if the symptoms worsen or if the condition fails to improve as anticipated.  I provided 16 minutes of non-face-to-face time during this encounter.  Maximiano Coss, NP

## 2021-09-13 NOTE — Progress Notes (Signed)
Called LM to have him call back to schedule nurse visit

## 2021-09-18 NOTE — Progress Notes (Signed)
Declined

## 2021-12-20 ENCOUNTER — Ambulatory Visit (INDEPENDENT_AMBULATORY_CARE_PROVIDER_SITE_OTHER): Payer: 59 | Admitting: Family Medicine

## 2021-12-20 ENCOUNTER — Encounter: Payer: Self-pay | Admitting: Family Medicine

## 2021-12-20 VITALS — BP 124/72 | HR 61 | Temp 98.1°F | Resp 16 | Ht 75.0 in | Wt 259.4 lb

## 2021-12-20 DIAGNOSIS — L989 Disorder of the skin and subcutaneous tissue, unspecified: Secondary | ICD-10-CM

## 2021-12-20 DIAGNOSIS — Z130 Encounter for screening for diseases of the blood and blood-forming organs and certain disorders involving the immune mechanism: Secondary | ICD-10-CM

## 2021-12-20 DIAGNOSIS — Z6832 Body mass index (BMI) 32.0-32.9, adult: Secondary | ICD-10-CM

## 2021-12-20 DIAGNOSIS — Z1329 Encounter for screening for other suspected endocrine disorder: Secondary | ICD-10-CM

## 2021-12-20 DIAGNOSIS — I10 Essential (primary) hypertension: Secondary | ICD-10-CM | POA: Diagnosis not present

## 2021-12-20 DIAGNOSIS — Z1211 Encounter for screening for malignant neoplasm of colon: Secondary | ICD-10-CM

## 2021-12-20 DIAGNOSIS — Z0001 Encounter for general adult medical examination with abnormal findings: Secondary | ICD-10-CM

## 2021-12-20 DIAGNOSIS — E785 Hyperlipidemia, unspecified: Secondary | ICD-10-CM

## 2021-12-20 DIAGNOSIS — Z131 Encounter for screening for diabetes mellitus: Secondary | ICD-10-CM

## 2021-12-20 DIAGNOSIS — Z0000 Encounter for general adult medical examination without abnormal findings: Secondary | ICD-10-CM

## 2021-12-20 NOTE — Progress Notes (Signed)
Subjective:  Patient ID: Ethan Knox, male    DOB: 03/17/1971  Age: 51 y.o. MRN: [id number]  CC:  Chief Complaint  Patient presents with   Annual Exam    Pt is doing okay, would like a dermatology referral for removal of a mole on his scalp, pt reports has had some pain with palpation     HPI Ethan Knox presents for  Physical and concern above. He was evaluated by my colleague in November for GERD, hypertension, depression, HSV and shift disorder sleep issues. Meds refilled.  Still working variable shifts, may be changing position again. 7rs until retirement. Some stress.  53 yo dog diagnosed with leukemia recently. Given about 7-10 days, comfort care.  Walking dogs for exercise.  Vacation few weeks ago. Talking to friends helpful. Life was going well past 6 months. 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 Has therapist - has not needed to meet with them lately.  Depression screen [facility] 2/9 12/20/2021 08/23/2020 02/18/2020 01/11/2020 12/14/2019  Decreased Interest 1 0 0 0 0  Down, Depressed, Hopeless 1 0 0 0 0  PHQ - 2 Score 2 0 0 0 0  Altered sleeping 1 - - - -  Tired, decreased energy 1 - - - -  Change in appetite 0 - - - -  Feeling bad or failure about yourself  0 - - - -  Trouble concentrating 0 - - - -  Moving slowly or fidgety/restless 0 - - - -  Suicidal thoughts 0 - - - -  PHQ-9 Score 4 - - - -  Difficult doing work/chores - - - - -   Scalp lesion: Left scalp, behind/above ear. Past few months.  Painful. Bleeds at times. No derm. No treatments.   Cancer screening: No FH of colon CA.  Screening options with colonoscopy versus Cologuard discussed. Discussed timing of repeat testing intervals if normal, as well as potential need for diagnostic Colonoscopy if positive Cologuard. Understanding expressed, and chose Cologuard.  No FH of prostate CA. The natural history of prostate cancer and ongoing controversy regarding  screening and potential treatment outcomes of prostate cancer has been discussed with the patient. The meaning of a false positive PSA and a false negative PSA has been discussed. He indicates understanding of the limitations of this screening test and wishes NOT to proceed with screening PSA testing.    Immunization History  Administered Date(s) Administered   Influenza Split 08/04/2012, 07/18/2015   Influenza,inj,Quad PF,6+ Mos 07/17/2017, 01/01/2019, 09/13/2019, 08/23/2020   Influenza,inj,quad, With Preservative 07/31/2017   Influenza-Unspecified 08/23/2021   PFIZER(Purple Top)SARS-COV-2 Vaccination 01/12/2020, 02/02/2020   Tdap 01/23/2012, 12/03/2019  Covid bivalent booster - plans on getting soon.  No results found. No cl/glasses. Reading glasses at times.   Dental:  Every 35month   Alcohol: 1 or less per week.   Tobacco: Cigars - daily. Recommended cutting back. Has stopped in past.   Exercise/obesity: Wt Readings from Last 3 Encounters:  12/20/21 259 lb 6.4 oz (117.7 kg)  08/30/21 230 lb (104.3 kg)  03/30/21 240 lb (108.9 kg)  Body mass index is 32.42 kg/m. Min soda. Frequent fast food d/t work. Some difficulty with meal prep in past. Walking dog for exercise, walking at work.   History Patient Active Problem List   Diagnosis Date Noted   Gastric nodule    Heart murmur 01/11/2020   Essential hypertension 12/14/2019   Visit for suture removal 12/14/2019  HSV-2 (herpes simplex virus 2) infection    Sleep disorder, shift work    Past Medical History:  Diagnosis Date   Depression    HSV-2 (herpes simplex virus 2) infection    Hypertension    Kidney stone    Sleep disorder, shift work    Past Surgical History:  Procedure Laterality Date   BIOPSY  06/08/2020   Procedure: BIOPSY;  Surgeon: Milus Banister, MD;  Location: [facility];  Service: Endoscopy;;   ESOPHAGOGASTRODUODENOSCOPY (EGD) WITH PROPOFOL N/A 06/08/2020   Procedure: ESOPHAGOGASTRODUODENOSCOPY  (EGD) WITH PROPOFOL;  Surgeon: Milus Banister, MD;  Location: [facility];  Service: Endoscopy;  Laterality: N/A;   ESOPHAGOGASTRODUODENOSCOPY (EGD) WITH PROPOFOL N/A 08/30/2021   Procedure: ESOPHAGOGASTRODUODENOSCOPY (EGD) WITH PROPOFOL;  Surgeon: Milus Banister, MD;  Location: [facility];  Service: Endoscopy;  Laterality: N/A;   EUS N/A 06/08/2020   Procedure: UPPER ENDOSCOPIC ULTRASOUND (EUS) RADIAL;  Surgeon: Milus Banister, MD;  Location: [facility];  Service: Endoscopy;  Laterality: N/A;   EUS N/A 08/30/2021   Procedure: UPPER ENDOSCOPIC ULTRASOUND (EUS) RADIAL;  Surgeon: Milus Banister, MD;  Location: [facility];  Service: Endoscopy;  Laterality: N/A;   VASECTOMY     Allergies  Allergen Reactions   Prilosec [Omeprazole Magnesium] Rash   Prior to Admission medications   Medication Sig Start Date End Date Taking? Authorizing Provider  cetirizine (ZYRTEC) 10 MG tablet Take 10 mg by mouth daily.   Yes [provider]  DULoxetine (CYMBALTA) 60 MG capsule Take 1 capsule (60 mg total) by mouth daily. 09/12/21  Yes Maximiano Coss, NP  fluticasone (FLONASE) 50 MCG/ACT nasal spray Place 1 spray into both nostrils daily as needed for allergies or rhinitis.   Yes [provider]  lisinopril-hydrochlorothiazide (ZESTORETIC) 20-12.5 MG tablet Take 1 tablet by mouth daily. 09/12/21  Yes Maximiano Coss, NP  pantoprazole (PROTONIX) 40 MG tablet Take 1 tablet (40 mg total) by mouth daily. 09/12/21  Yes Maximiano Coss, NP  valACYclovir (VALTREX) 500 MG tablet Take 1 tablet (500 mg total) by mouth daily. 09/12/21  Yes Maximiano Coss, NP  zolpidem (AMBIEN) 10 MG tablet Take 0.5 tablets (5 mg total) by mouth at bedtime as needed for sleep. 09/12/21  Yes Maximiano Coss, NP  HYDROcodone-acetaminophen (NORCO/VICODIN) 5-325 MG tablet Take 1 tablet by mouth every 6 (six) hours as needed. Patient not taking: No sig reported 03/30/21   Charmaine Downs C, PA-C  naproxen (NAPROSYN)  500 MG tablet Take 1 tablet (500 mg total) by mouth 2 (two) times daily. 03/30/21   Suzy Bouchard, PA-C  ondansetron (ZOFRAN ODT) 4 MG disintegrating tablet Take 1 tablet (4 mg total) by mouth every 8 (eight) hours as needed for nausea or vomiting. Patient not taking: Reported on 08/28/2021 03/30/21   Suzy Bouchard, PA-C   Social History   Socioeconomic History   Marital status: Married    Spouse name: [name]   Number of children: 1   Years of education: 14   Highest education level: Not on file  Occupational History   Occupation: Engineer, structural  Tobacco Use   Smoking status: Some Days    Types: Cigars   Smokeless tobacco: Never  Vaping Use   Vaping Use: Never used  Substance and Sexual Activity   Alcohol use: Yes    Comment: occ   Drug use: No   Sexual activity: Yes    Comment: vasectomy  Other Topics Concern   Not on file  Social  History Narrative   Lives with his wife.  His 20 year old son lives in [place].   Social Determinants of Health   Financial Resource Strain: Not on file  Food Insecurity: Not on file  Transportation Needs: Not on file  Physical Activity: Not on file  Stress: Not on file  Social Connections: Not on file  Intimate Partner Violence: Not on file    Review of Systems  13 point review of systems per patient health survey noted.  Negative other than as indicated above or in HPI.   Objective:   Vitals:   12/20/21 1413  BP: 124/72  Pulse: 61  Resp: 16  Temp: 98.1 F (36.7 C)  TempSrc: Temporal  SpO2: 95%  Weight: 259 lb 6.4 oz (117.7 kg)  Height: 6' 3"  (1.905 m)     Physical Exam Vitals reviewed.  Constitutional:      Appearance: He is well-developed.  HENT:     Head: Normocephalic and atraumatic.     Right Ear: External ear normal.     Left Ear: External ear normal.  Eyes:     Conjunctiva/sclera: Conjunctivae normal.     Pupils: Pupils are equal, round, and reactive to light.  Neck:     Thyroid: No  thyromegaly.     Vascular: No carotid bruit or JVD.  Cardiovascular:     Rate and Rhythm: Normal rate and regular rhythm.     Heart sounds: Normal heart sounds. No murmur heard. Pulmonary:     Effort: Pulmonary effort is normal. No respiratory distress.     Breath sounds: Normal breath sounds. No wheezing or rales.  Abdominal:     General: There is no distension.     Palpations: Abdomen is soft.     Tenderness: There is no abdominal tenderness.  Musculoskeletal:        General: No tenderness. Normal range of motion.     Cervical back: Normal range of motion and neck supple.     Right lower leg: No edema.     Left lower leg: No edema.  Lymphadenopathy:     Cervical: No cervical adenopathy.  Skin:    General: Skin is warm and dry.     Comments: Left scalp, approximately 3 mm flesh-colored lesion with small projectiles centrally, elevated.  No surrounding erythema or discharge see photo  Neurological:     Mental Status: He is alert and oriented to person, place, and time.     Deep Tendon Reflexes: Reflexes are normal and symmetric.  Psychiatric:        Mood and Affect: Mood normal.        Behavior: Behavior normal.        Assessment & Plan:  Ethan Knox is a 51 y.o. male . Annual physical exam  - -anticipatory guidance as below in AVS, screening labs above. Health maintenance items as above in HPI discussed/recommended as applicable.   -Decreased cigar use recommended.  Essential hypertension  -Stable on current regimen, continue same dose Zestoretic.  Fasting labs planned  Hyperlipidemia, unspecified hyperlipidemia type - Plan: Comprehensive metabolic panel, Lipid panel  -Check fasting labs.  Diet, exercise discussed with cutting back on fast food, increased exercise.  Screening for deficiency anemia - Plan: CBC with Differential/Platelet, Comprehensive metabolic panel  Screening for diabetes mellitus - Plan: Comprehensive metabolic panel, Hemoglobin  A1c  Screening for colon cancer - Plan: Cologuard  Screening for thyroid disorder - Plan: TSH BMI 32.0-32.9,adult  -Dietary advice given with decrease  fast food, increased activity with exercise goals given, follow-up in 6 weeks.  Baseline labs as above.  Check TSH.   Scalp lesion - Plan: Ambulatory referral to Dermatology Differential includes cutaneous horn, squamous cell, irritated nevus.  Refer to dermatology for likely biopsy/excision.  No orders of the defined types were placed in this encounter.  Patient Instructions  Elbert Ewings in there with recent stressors. Let me know if we can help. Cut back on cigars as you can.   Increased exercise and cutting back on fast food is important to help with weight loss. Goal of 159mn per week. Will help with managing stress as well.  [facility].  Thanks for coming in today.   Preventive Care [id number] Old, Male Preventive care refers to lifestyle choices and visits with your health care provider that can promote health and wellness. Preventive care visits are also called wellness exams. What can I expect for my preventive care visit? Counseling During your preventive care visit, your health care provider may ask about your: Medical history, including: Past medical problems. Family medical history. Current health, including: Emotional well-being. Home life and relationship well-being. Sexual activity. Lifestyle, including: Alcohol, nicotine or tobacco, and drug use. Access to firearms. Diet, exercise, and sleep habits. Safety issues such as seatbelt and bike helmet use. Sunscreen use. Work and work eStatistician Physical exam Your health care provider will check your: Height and weight. These may be used to calculate your BMI (body mass index). BMI is a measurement that tells if you are at a healthy weight. Waist circumference. This measures the distance around your waistline. This measurement also tells if you are  at a healthy weight and may help predict your risk of certain diseases, such as type 2 diabetes and high blood pressure. Heart rate and blood pressure. Body temperature. Skin for abnormal spots. What immunizations do I need? Vaccines are usually given at various ages, according to a schedule. Your health care provider will recommend vaccines for you based on your age, medical history, and lifestyle or other factors, such as travel or where you work. What tests do I need? Screening Your health care provider may recommend screening tests for certain conditions. This may include: Lipid and cholesterol levels. Diabetes screening. This is done by checking your blood sugar (glucose) after you have not eaten for a while (fasting). Hepatitis B test. Hepatitis C test. HIV (human immunodeficiency virus) test. STI (sexually transmitted infection) testing, if you are at risk. Lung cancer screening. Prostate cancer screening. Colorectal cancer screening. Talk with your health care provider about your test results, treatment options, and if necessary, the need for more tests. Follow these instructions at home: Eating and drinking  Eat a diet that includes fresh fruits and vegetables, whole grains, lean protein, and low-fat dairy products. Take vitamin and mineral supplements as recommended by your health care provider. Do not drink alcohol if your health care provider tells you not to drink. If you drink alcohol: Limit how much you have to 0-2 drinks a day. Know how much alcohol is in your drink. In the [place] one drink equals one 12 oz bottle of beer (355 mL), one 5 oz glass of wine (148 mL), or one 1 oz glass of hard liquor (44 mL). Lifestyle Brush your teeth every morning and night with fluoride toothpaste. Floss one time each day. Exercise for at least 30 minutes 5 or more days each week. Do not use any products that contain nicotine or  tobacco. These products include cigarettes, chewing  tobacco, and vaping devices, such as e-cigarettes. If you need help quitting, ask your health care provider. Do not use drugs. If you are sexually active, practice safe sex. Use a condom or other form of protection to prevent STIs. Take aspirin only as told by your health care provider. Make sure that you understand how much to take and what form to take. Work with your health care provider to find out whether it is safe and beneficial for you to take aspirin daily. Find healthy ways to manage stress, such as: Meditation, yoga, or listening to music. Journaling. Talking to a trusted person. Spending time with friends and family. Minimize exposure to UV radiation to reduce your risk of skin cancer. Safety Always wear your seat belt while driving or riding in a vehicle. Do not drive: If you have been drinking alcohol. Do not ride with someone who has been drinking. When you are tired or distracted. While texting. If you have been using any mind-altering substances or drugs. Wear a helmet and other protective equipment during sports activities. If you have firearms in your house, make sure you follow all gun safety procedures. What's next? Go to your health care provider once a year for an annual wellness visit. Ask your health care provider how often you should have your eyes and teeth checked. Stay up to date on all vaccines. This information is not intended to replace advice given to you by your health care provider. Make sure you discuss any questions you have with your health care provider. Document Revised: 04/25/2021 Document Reviewed: 04/25/2021 Elsevier Patient Education  2022 [place],   Merri Ray, MD [place] 12/20/21 2:34 PM

## 2021-12-20 NOTE — Patient Instructions (Addendum)
Hang in there with recent stressors. Let me know if we can help. Cut back on cigars as you can.   Increased exercise and cutting back on fast food is important to help with weight loss. Goal of 128mn per week. Will help with managing stress as well.  [facility].  Thanks for coming in today.   Preventive Care [id number] Old, Male Preventive care refers to lifestyle choices and visits with your health care provider that can promote health and wellness. Preventive care visits are also called wellness exams. What can I expect for my preventive care visit? Counseling During your preventive care visit, your health care provider may ask about your: Medical history, including: Past medical problems. Family medical history. Current health, including: Emotional well-being. Home life and relationship well-being. Sexual activity. Lifestyle, including: Alcohol, nicotine or tobacco, and drug use. Access to firearms. Diet, exercise, and sleep habits. Safety issues such as seatbelt and bike helmet use. Sunscreen use. Work and work eStatistician Physical exam Your health care provider will check your: Height and weight. These may be used to calculate your BMI (body mass index). BMI is a measurement that tells if you are at a healthy weight. Waist circumference. This measures the distance around your waistline. This measurement also tells if you are at a healthy weight and may help predict your risk of certain diseases, such as type 2 diabetes and high blood pressure. Heart rate and blood pressure. Body temperature. Skin for abnormal spots. What immunizations do I need? Vaccines are usually given at various ages, according to a schedule. Your health care provider will recommend vaccines for you based on your age, medical history, and lifestyle or other factors, such as travel or where you work. What tests do I need? Screening Your health care provider may recommend screening  tests for certain conditions. This may include: Lipid and cholesterol levels. Diabetes screening. This is done by checking your blood sugar (glucose) after you have not eaten for a while (fasting). Hepatitis B test. Hepatitis C test. HIV (human immunodeficiency virus) test. STI (sexually transmitted infection) testing, if you are at risk. Lung cancer screening. Prostate cancer screening. Colorectal cancer screening. Talk with your health care provider about your test results, treatment options, and if necessary, the need for more tests. Follow these instructions at home: Eating and drinking  Eat a diet that includes fresh fruits and vegetables, whole grains, lean protein, and low-fat dairy products. Take vitamin and mineral supplements as recommended by your health care provider. Do not drink alcohol if your health care provider tells you not to drink. If you drink alcohol: Limit how much you have to 0-2 drinks a day. Know how much alcohol is in your drink. In the [place] one drink equals one 12 oz bottle of beer (355 mL), one 5 oz glass of wine (148 mL), or one 1 oz glass of hard liquor (44 mL). Lifestyle Brush your teeth every morning and night with fluoride toothpaste. Floss one time each day. Exercise for at least 30 minutes 5 or more days each week. Do not use any products that contain nicotine or tobacco. These products include cigarettes, chewing tobacco, and vaping devices, such as e-cigarettes. If you need help quitting, ask your health care provider. Do not use drugs. If you are sexually active, practice safe sex. Use a condom or other form of protection to prevent STIs. Take aspirin only as told by your health care provider. Make sure that you understand  how much to take and what form to take. Work with your health care provider to find out whether it is safe and beneficial for you to take aspirin daily. Find healthy ways to manage stress, such as: Meditation, yoga, or listening  to music. Journaling. Talking to a trusted person. Spending time with friends and family. Minimize exposure to UV radiation to reduce your risk of skin cancer. Safety Always wear your seat belt while driving or riding in a vehicle. Do not drive: If you have been drinking alcohol. Do not ride with someone who has been drinking. When you are tired or distracted. While texting. If you have been using any mind-altering substances or drugs. Wear a helmet and other protective equipment during sports activities. If you have firearms in your house, make sure you follow all gun safety procedures. What's next? Go to your health care provider once a year for an annual wellness visit. Ask your health care provider how often you should have your eyes and teeth checked. Stay up to date on all vaccines. This information is not intended to replace advice given to you by your health care provider. Make sure you discuss any questions you have with your health care provider. Document Revised: 04/25/2021 Document Reviewed: 04/25/2021 Elsevier Patient Education  [place].

## 2021-12-31 ENCOUNTER — Other Ambulatory Visit (INDEPENDENT_AMBULATORY_CARE_PROVIDER_SITE_OTHER): Payer: 59

## 2021-12-31 DIAGNOSIS — Z131 Encounter for screening for diabetes mellitus: Secondary | ICD-10-CM | POA: Diagnosis not present

## 2021-12-31 DIAGNOSIS — E785 Hyperlipidemia, unspecified: Secondary | ICD-10-CM | POA: Diagnosis not present

## 2021-12-31 DIAGNOSIS — Z1329 Encounter for screening for other suspected endocrine disorder: Secondary | ICD-10-CM

## 2021-12-31 DIAGNOSIS — Z130 Encounter for screening for diseases of the blood and blood-forming organs and certain disorders involving the immune mechanism: Secondary | ICD-10-CM | POA: Diagnosis not present

## 2021-12-31 LAB — CBC WITH DIFFERENTIAL/PLATELET
Basophils Absolute: 0 10*3/uL (ref 0.0–0.1)
Basophils Relative: 0.8 % (ref 0.0–3.0)
Eosinophils Absolute: 0.1 10*3/uL (ref 0.0–0.7)
Eosinophils Relative: 2.4 % (ref 0.0–5.0)
HCT: 43.8 % (ref 39.0–52.0)
Hemoglobin: 15.4 g/dL (ref 13.0–17.0)
Lymphocytes Relative: 44 % (ref 12.0–46.0)
Lymphs Abs: 2.6 10*3/uL (ref 0.7–4.0)
MCHC: 35.1 g/dL (ref 30.0–36.0)
MCV: 92.3 fl (ref 78.0–100.0)
Monocytes Absolute: 0.4 10*3/uL (ref 0.1–1.0)
Monocytes Relative: 6.1 % (ref 3.0–12.0)
Neutro Abs: 2.8 10*3/uL (ref 1.4–7.7)
Neutrophils Relative %: 46.7 % (ref 43.0–77.0)
Platelets: 305 10*3/uL (ref 150.0–400.0)
RBC: 4.75 Mil/uL (ref 4.22–5.81)
RDW: 12.4 % (ref 11.5–15.5)
WBC: 5.9 10*3/uL (ref 4.0–10.5)

## 2021-12-31 LAB — COMPREHENSIVE METABOLIC PANEL
ALT: 37 U/L (ref 0–53)
AST: 20 U/L (ref 0–37)
Albumin: 4.5 g/dL (ref 3.5–5.2)
Alkaline Phosphatase: 52 U/L (ref 39–117)
BUN: 19 mg/dL (ref 6–23)
CO2: 28 mEq/L (ref 19–32)
Calcium: 9.5 mg/dL (ref 8.4–10.5)
Chloride: 101 mEq/L (ref 96–112)
Creatinine, Ser: 0.95 mg/dL (ref 0.40–1.50)
GFR: 94.52 mL/min (ref >60.00)
Glucose, Bld: 109 mg/dL — ABNORMAL HIGH (ref 70–99)
Potassium: 4 mEq/L (ref 3.5–5.1)
Sodium: 135 mEq/L (ref 135–145)
Total Bilirubin: 0.9 mg/dL (ref 0.2–1.2)
Total Protein: 7.4 g/dL (ref 6.0–8.3)

## 2021-12-31 LAB — HEMOGLOBIN A1C: Hgb A1c MFr Bld: 5.4 % (ref 4.6–6.5)

## 2021-12-31 LAB — LIPID PANEL
Cholesterol: 195 mg/dL (ref 0–200)
HDL: 41.6 mg/dL (ref >39.00)
LDL Cholesterol: 114 mg/dL — ABNORMAL HIGH (ref 0–99)
NonHDL: 153.25
Total CHOL/HDL Ratio: 5
Triglycerides: 196 mg/dL — ABNORMAL HIGH (ref 0.0–149.0)
VLDL: 39.2 mg/dL (ref 0.0–40.0)

## 2021-12-31 LAB — TSH: TSH: 1.44 u[IU]/mL (ref 0.35–5.50)

## 2022-01-01 ENCOUNTER — Encounter: Payer: Self-pay | Admitting: Registered Nurse

## 2022-01-01 ENCOUNTER — Ambulatory Visit: Payer: 59 | Admitting: Registered Nurse

## 2022-01-01 VITALS — BP 128/72 | HR 61 | Temp 98.2°F | Resp 18 | Ht 75.0 in | Wt 261.2 lb

## 2022-01-01 DIAGNOSIS — M5441 Lumbago with sciatica, right side: Secondary | ICD-10-CM

## 2022-01-01 MED ORDER — METHOCARBAMOL 500 MG PO TABS
500.0000 mg | ORAL_TABLET | Freq: Four times a day (QID) | ORAL | 0 refills | Status: DC
Start: 2022-01-01 — End: 2023-01-10

## 2022-01-01 MED ORDER — DICLOFENAC SODIUM 75 MG PO TBEC: 75.0000 mg | DELAYED_RELEASE_TABLET | Freq: Two times a day (BID) | ORAL | 0 refills | Status: DC

## 2022-01-01 MED ORDER — METHYLPREDNISOLONE ACETATE 80 MG/ML IJ SUSP
80.0000 mg | Freq: Once | INTRAMUSCULAR | Status: AC
  Administered 2022-01-01: 80 mg via INTRAMUSCULAR

## 2022-01-01 NOTE — Patient Instructions (Signed)
Mr. Musich -  Doristine Devoid to meet you in person  Depo medrol injection should help right off the bat  Can use diclofenac twice daily, add tylenol if needed  Methocarbamol for muscle relaxer   Call if worsening or failing to improve  Thank you  Denice Paradise

## 2022-01-01 NOTE — Progress Notes (Signed)
Established Patient Office Visit  Subjective:  Patient ID: Ethan Knox, male    DOB: [birth year]  Age: 51 y.o. MRN: [id number]  CC:  Chief Complaint  Patient presents with   Back Pain    Patient states he has been having back pain since Thursday. Patient states he has took some advil and naproxen with some relief. Patient states it has been hard to stand up straight     HPI Ethan Knox presents for back pain Acute onset last Thursday Has used heat, stretching, NSAIDs, pressure, topicals. Limited relief.   Has had this before, but usually can manage at home.   No changes to routine, exercise etc.  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   Past Medical History:  Diagnosis Date   Depression    HSV-2 (herpes simplex virus 2) infection    Hypertension    Kidney stone    Sleep disorder, shift work     Past Surgical History:  Procedure Laterality Date   BIOPSY  06/08/2020   Procedure: BIOPSY;  Surgeon: Milus Banister, MD;  Location: [facility];  Service: Endoscopy;;   ESOPHAGOGASTRODUODENOSCOPY (EGD) WITH PROPOFOL N/A 06/08/2020   Procedure: ESOPHAGOGASTRODUODENOSCOPY (EGD) WITH PROPOFOL;  Surgeon: Milus Banister, MD;  Location: [facility];  Service: Endoscopy;  Laterality: N/A;   ESOPHAGOGASTRODUODENOSCOPY (EGD) WITH PROPOFOL N/A 08/30/2021   Procedure: ESOPHAGOGASTRODUODENOSCOPY (EGD) WITH PROPOFOL;  Surgeon: Milus Banister, MD;  Location: [facility];  Service: Endoscopy;  Laterality: N/A;   EUS N/A 06/08/2020   Procedure: UPPER ENDOSCOPIC ULTRASOUND (EUS) RADIAL;  Surgeon: Milus Banister, MD;  Location: [facility];  Service: Endoscopy;  Laterality: N/A;   EUS N/A 08/30/2021   Procedure: UPPER ENDOSCOPIC ULTRASOUND (EUS) RADIAL;  Surgeon: Milus Banister, MD;  Location: [facility];  Service: Endoscopy;  Laterality: N/A;   VASECTOMY      Family History  Problem Relation Age of Onset   Diabetes Father     Arthritis Father        gout   Heart disease Father    Mental retardation Sister    Heart disease Paternal Grandmother    Colon cancer Neg Hx    Esophageal cancer Neg Hx    Pancreatic cancer Neg Hx    Stomach cancer Neg Hx     Social History   Socioeconomic History   Marital status: Married    Spouse name: [name]   Number of children: 1   Years of education: 2   Highest education level: Not on file  Occupational History   Occupation: Engineer, structural  Tobacco Use   Smoking status: Some Days    Types: Cigars   Smokeless tobacco: Never  Vaping Use   Vaping Use: Never used  Substance and Sexual Activity   Alcohol use: Yes    Comment: occ   Drug use: No   Sexual activity: Yes    Comment: vasectomy  Other Topics Concern   Not on file  Social History Narrative   Lives with his wife.  His 49 year old son lives in [place].   Social Determinants of Health   Financial Resource Strain: Not on file  Food Insecurity: Not on file  Transportation Needs: Not on file  Physical Activity: Not on file  Stress: Not on file  Social Connections: Not on file  Intimate Partner Violence: Not on file    Outpatient Medications Prior to Visit  Medication Sig Dispense Refill  cetirizine (ZYRTEC) 10 MG tablet Take 10 mg by mouth daily.     DULoxetine (CYMBALTA) 60 MG capsule Take 1 capsule (60 mg total) by mouth daily. 90 capsule 3   fluticasone (FLONASE) 50 MCG/ACT nasal spray Place 1 spray into both nostrils daily as needed for allergies or rhinitis.     lisinopril-hydrochlorothiazide (ZESTORETIC) 20-12.5 MG tablet Take 1 tablet by mouth daily. 90 tablet 1   pantoprazole (PROTONIX) 40 MG tablet Take 1 tablet (40 mg total) by mouth daily. 90 tablet 3   valACYclovir (VALTREX) 500 MG tablet Take 1 tablet (500 mg total) by mouth daily. 90 tablet 3   zolpidem (AMBIEN) 10 MG tablet Take 0.5 tablets (5 mg total) by mouth at bedtime as needed for sleep. 30 tablet 0   No  facility-administered medications prior to visit.    Allergies  Allergen Reactions   Prilosec [Omeprazole Magnesium] Rash    ROS Review of Systems  Constitutional: Negative.   HENT: Negative.    Eyes: Negative.   Respiratory: Negative.    Cardiovascular: Negative.   Gastrointestinal: Negative.   Genitourinary: Negative.   Musculoskeletal: Negative.   Skin: Negative.   Neurological: Negative.   Psychiatric/Behavioral: Negative.    All other systems reviewed and are negative.    Objective:    Physical Exam Constitutional:      General: He is not in acute distress.    Appearance: Normal appearance. He is normal weight. He is not ill-appearing, toxic-appearing or diaphoretic.  Cardiovascular:     Rate and Rhythm: Normal rate and regular rhythm.     Heart sounds: Normal heart sounds. No murmur heard.   No friction rub. No gallop.  Pulmonary:     Effort: Pulmonary effort is normal. No respiratory distress.     Breath sounds: Normal breath sounds. No stridor. No wheezing, rhonchi or rales.  Chest:     Chest wall: No tenderness.  Musculoskeletal:        General: Tenderness present. No swelling, deformity or signs of injury. Normal range of motion.     Right lower leg: No edema.     Left lower leg: No edema.  Neurological:     General: No focal deficit present.     Mental Status: He is alert and oriented to person, place, and time. Mental status is at baseline.  Psychiatric:        Mood and Affect: Mood normal.        Behavior: Behavior normal.        Thought Content: Thought content normal.        Judgment: Judgment normal.    BP 128/72    Pulse 61    Temp 98.2 F (36.8 C) (Temporal)    Resp 18    Ht 6' 3"  (1.905 m)    Wt 261 lb 3.2 oz (118.5 kg)    SpO2 98%    BMI 32.65 kg/m  Wt Readings from Last 3 Encounters:  01/01/22 261 lb 3.2 oz (118.5 kg)  12/20/21 259 lb 6.4 oz (117.7 kg)  08/30/21 230 lb (104.3 kg)     Health Maintenance Due  Topic Date Due   HIV  Screening  Never done   Hepatitis C Screening  Never done   COLONOSCOPY (Pts 45-60yr Insurance coverage will need to be confirmed)  Never done   COVID-19 Vaccine (4 - Booster for [place] 02/01/2021    There are no preventive care reminders to display for this patient.  Lab Results  Component Value Date   TSH 1.44 12/31/2021   Lab Results  Component Value Date   WBC 5.9 12/31/2021   HGB 15.4 12/31/2021   HCT 43.8 12/31/2021   MCV 92.3 12/31/2021   PLT 305.0 12/31/2021   Lab Results  Component Value Date   NA 135 12/31/2021   K 4.0 12/31/2021   CO2 28 12/31/2021   GLUCOSE 109 (H) 12/31/2021   BUN 19 12/31/2021   CREATININE 0.95 12/31/2021   BILITOT 0.9 12/31/2021   ALKPHOS 52 12/31/2021   AST 20 12/31/2021   ALT 37 12/31/2021   PROT 7.4 12/31/2021   ALBUMIN 4.5 12/31/2021   CALCIUM 9.5 12/31/2021   ANIONGAP 11 03/30/2021   GFR 94.52 12/31/2021   Lab Results  Component Value Date   CHOL 195 12/31/2021   Lab Results  Component Value Date   HDL 41.60 12/31/2021   Lab Results  Component Value Date   LDLCALC 114 (H) 12/31/2021   Lab Results  Component Value Date   TRIG 196.0 (H) 12/31/2021   Lab Results  Component Value Date   CHOLHDL 5 12/31/2021   Lab Results  Component Value Date   HGBA1C 5.4 12/31/2021      Assessment & Plan:   Problem List Items Addressed This Visit   None Visit Diagnoses     Acute right-sided low back pain with right-sided sciatica    -  Primary   Relevant Medications   methylPREDNISolone acetate (DEPO-MEDROL) injection 80 mg (Start on 01/01/2022  4:45 PM)   diclofenac (VOLTAREN) 75 MG EC tablet   methocarbamol (ROBAXIN) 500 MG tablet       Meds ordered this encounter  Medications   methylPREDNISolone acetate (DEPO-MEDROL) injection 80 mg   diclofenac (VOLTAREN) 75 MG EC tablet    Sig: Take 1 tablet (75 mg total) by mouth 2 (two) times daily.    Dispense:  30 tablet    Refill:  0    Order Specific Question:    Supervising Provider    Answer:   Carlota Raspberry, JEFFREY R [[id number]]   methocarbamol (ROBAXIN) 500 MG tablet    Sig: Take 1 tablet (500 mg total) by mouth 4 (four) times daily.    Dispense:  60 tablet    Refill:  0    Order Specific Question:   Supervising Provider    Answer:   Carlota Raspberry, JEFFREY R [[id number]]    Follow-up: Return if symptoms worsen or fail to improve.   PLAN Suspect muscle spasm. No suggestion of spinal stenosis or nerve injury. Diclofenac and methocarbamol as above Depo medrol im given Encouraged to stretch Worsening or failing to improve - consider PT and imaging Patient encouraged to call clinic with any questions, comments, or concerns.  Maximiano Coss, NP

## 2022-01-09 ENCOUNTER — Ambulatory Visit: Payer: 59 | Admitting: Family Medicine

## 2022-01-21 LAB — COLOGUARD: COLOGUARD: NEGATIVE

## 2022-02-04 ENCOUNTER — Ambulatory Visit: Payer: 59 | Admitting: Family Medicine

## 2022-02-04 ENCOUNTER — Other Ambulatory Visit: Payer: Self-pay

## 2022-02-04 ENCOUNTER — Ambulatory Visit (INDEPENDENT_AMBULATORY_CARE_PROVIDER_SITE_OTHER)
Admission: RE | Admit: 2022-02-04 | Discharge: 2022-02-04 | Disposition: A | Discharge status: Home / Self Care | Payer: 59 | Source: Ambulatory Visit | Attending: Family Medicine | Admitting: Family Medicine

## 2022-02-04 ENCOUNTER — Encounter: Payer: Self-pay | Admitting: Family Medicine

## 2022-02-04 VITALS — BP 118/70 | HR 55 | Temp 98.0°F | Resp 17 | Ht 75.0 in | Wt 257.0 lb

## 2022-02-04 DIAGNOSIS — M5441 Lumbago with sciatica, right side: Secondary | ICD-10-CM | POA: Diagnosis not present

## 2022-02-04 DIAGNOSIS — Z6832 Body mass index (BMI) 32.0-32.9, adult: Secondary | ICD-10-CM | POA: Diagnosis not present

## 2022-02-04 DIAGNOSIS — M5431 Sciatica, right side: Secondary | ICD-10-CM

## 2022-02-04 NOTE — Progress Notes (Signed)
? ?Subjective:  ?Patient ID: Ethan Knox, male    DOB: 04-15-1971  Age: 51 y.o. MRN: [id number] ? ?CC:  ?Chief Complaint  ?Patient presents with  ? Weight Loss  ?  Pt notes he has cut back on fast food notes no questions   ? Back Pain  ?  Lower back pain saw Ethan Knox, pt notes has improved since that visit but is still present prescription meds have helped so far   ? ? ?HPI ?Ethan Knox presents for  ? ?Obesity: ?Discussed February 9 visit.  Dietary advice given including decreasing fast food.  He has cut back since last visit.  TSH was normal. No new exercise.  ?No soda/sweet tea.  ? ?Wt Readings from Last 3 Encounters:  ?02/04/22 257 lb (116.6 kg)  ?01/01/22 261 lb 3.2 oz (118.5 kg)  ?12/20/21 259 lb 6.4 oz (117.7 kg)  ? ?Lab Results  ?Component Value Date  ? TSH 1.44 12/31/2021  ? ?Acute right-sided low back pain. ?Visit with my colleague on 01/01/2022.  Acute back pain treated with Depo-Medrol injection 80 mg, Voltaren, methocarbamol.  ?Still some pain on R side of low back, down R leg then changes to numbness into thigh. Off and on in the past, had been more persistent. Meds and stretches help - relieves for a few days.  ?Feel like pressure from gun belt with driving.  ?No bowel or bladder incontinence, no saddle anesthesia, no lower extremity weakness.  No fever, night sweats.  ?Tx: intermittent voltaren for flairs of pain, or advil, methocarbamol few doses.  ? ? ?History ?Patient Active Problem List  ? Diagnosis Date Noted  ? Gastric nodule   ? Heart murmur 01/11/2020  ? Essential hypertension 12/14/2019  ? Visit for suture removal 12/14/2019  ? HSV-2 (herpes simplex virus 2) infection   ? Sleep disorder, shift work   ? ?Past Medical History:  ?Diagnosis Date  ? Depression   ? HSV-2 (herpes simplex virus 2) infection   ? Hypertension   ? Kidney stone   ? Sleep disorder, shift work   ? ?Past Surgical History:  ?Procedure Laterality Date  ? BIOPSY  06/08/2020  ? Procedure: BIOPSY;  Surgeon:  Milus Banister, MD;  Location: [facility];  Service: Endoscopy;;  ? ESOPHAGOGASTRODUODENOSCOPY (EGD) WITH PROPOFOL N/A 06/08/2020  ? Procedure: ESOPHAGOGASTRODUODENOSCOPY (EGD) WITH PROPOFOL;  Surgeon: Milus Banister, MD;  Location: [facility];  Service: Endoscopy;  Laterality: N/A;  ? ESOPHAGOGASTRODUODENOSCOPY (EGD) WITH PROPOFOL N/A 08/30/2021  ? Procedure: ESOPHAGOGASTRODUODENOSCOPY (EGD) WITH PROPOFOL;  Surgeon: Milus Banister, MD;  Location: [facility];  Service: Endoscopy;  Laterality: N/A;  ? EUS N/A 06/08/2020  ? Procedure: UPPER ENDOSCOPIC ULTRASOUND (EUS) RADIAL;  Surgeon: Milus Banister, MD;  Location: [facility];  Service: Endoscopy;  Laterality: N/A;  ? EUS N/A 08/30/2021  ? Procedure: UPPER ENDOSCOPIC ULTRASOUND (EUS) RADIAL;  Surgeon: Milus Banister, MD;  Location: [facility];  Service: Endoscopy;  Laterality: N/A;  ? VASECTOMY    ? ?Allergies  ?Allergen Reactions  ? Prilosec [Omeprazole Magnesium] Rash  ? ?Prior to Admission medications   ?Medication Sig Start Date End Date Taking? Authorizing Provider  ?cetirizine (ZYRTEC) 10 MG tablet Take 10 mg by mouth daily.   Yes [provider]  ?diclofenac (VOLTAREN) 75 MG EC tablet Take 1 tablet (75 mg total) by mouth 2 (two) times daily. 01/01/22  Yes Ethan Coss, NP  ?DULoxetine (CYMBALTA) 60 MG capsule Take 1 capsule (60 mg total) by  mouth daily. 09/12/21  Yes Ethan Coss, NP  ?fluticasone (FLONASE) 50 MCG/ACT nasal spray Place 1 spray into both nostrils daily as needed for allergies or rhinitis.   Yes [provider]  ?lisinopril-hydrochlorothiazide (ZESTORETIC) 20-12.5 MG tablet Take 1 tablet by mouth daily. 09/12/21  Yes Ethan Coss, NP  ?methocarbamol (ROBAXIN) 500 MG tablet Take 1 tablet (500 mg total) by mouth 4 (four) times daily. 01/01/22  Yes Ethan Coss, NP  ?pantoprazole (PROTONIX) 40 MG tablet Take 1 tablet (40 mg total) by mouth daily. 09/12/21  Yes Ethan Coss, NP  ?valACYclovir (VALTREX)  500 MG tablet Take 1 tablet (500 mg total) by mouth daily. 09/12/21  Yes Ethan Coss, NP  ?zolpidem (AMBIEN) 10 MG tablet Take 0.5 tablets (5 mg total) by mouth at bedtime as needed for sleep. 09/12/21  Yes Ethan Coss, NP  ? ?Social History  ? ?Socioeconomic History  ? Marital status: Married  ?  Spouse name: Ethan Knox  ? Number of children: 1  ? Years of education: 29  ? Highest education level: Not on file  ?Occupational History  ? Occupation: Engineer, structural  ?Tobacco Use  ? Smoking status: Some Days  ?  Types: Cigars  ? Smokeless tobacco: Never  ?Vaping Use  ? Vaping Use: Never used  ?Substance and Sexual Activity  ? Alcohol use: Yes  ?  Comment: occ  ? Drug use: No  ? Sexual activity: Yes  ?  Comment: vasectomy  ?Other Topics Concern  ? Not on file  ?Social History Narrative  ? Lives with his wife.  His 63 year old son lives in [place].  ? ?Social Determinants of Health  ? ?Financial Resource Strain: Not on file  ?Food Insecurity: Not on file  ?Transportation Needs: Not on file  ?Physical Activity: Not on file  ?Stress: Not on file  ?Social Connections: Not on file  ?Intimate Partner Violence: Not on file  ? ? ?Review of Systems ? ? ?Objective:  ? ?Vitals:  ? 02/04/22 1045  ?BP: 118/70  ?Pulse: (!) 55  ?Resp: 17  ?Temp: 98 ?F (36.7 ?C)  ?TempSrc: Temporal  ?SpO2: 97%  ?Weight: 257 lb (116.6 kg)  ?Height: 6' 3"  (1.905 m)  ? ? ? ?Physical Exam ?Constitutional:   ?   General: He is not in acute distress. ?   Appearance: Normal appearance. He is well-developed.  ?HENT:  ?   Head: Normocephalic and atraumatic.  ?Cardiovascular:  ?   Rate and Rhythm: Normal rate and regular rhythm.  ?Pulmonary:  ?   Effort: Pulmonary effort is normal.  ?   Breath sounds: Normal breath sounds.  ?Abdominal:  ?   General: There is no distension.  ?   Tenderness: There is no right CVA tenderness or left CVA tenderness.  ?Musculoskeletal:  ?   Comments: No focal bony tenderness/midline lumbar spine tenderness.  Sciatic notch  nontender.  Negative seated straight leg raise.  Ambulates without assistance.  Reflexes 2+ at patella, Achilles bilaterally.  ?Neurological:  ?   General: No focal deficit present.  ?   Mental Status: He is alert and oriented to person, place, and time.  ?Psychiatric:     ?   Mood and Affect: Mood normal.  ? ? ? ? ? ?Assessment & Plan:  ?Ethan Knox is a 51 y.o. male . ?Acute right-sided low back pain with right-sided sciatica - Plan: DG Lumbar Spine Complete ?Sciatica of right side - Plan: DG Lumbar Spine Complete, Ambulatory referral to  Physical Therapy ? -History of episodic right low back pain, sciatic symptoms now with more recurrent, persistent symptoms.  No known injury.  Some improvement after previous treatment, still requiring intermittent NSAID, muscle relaxant.  Overall reassuring exam.  Will check x-ray for initial imaging, refer to physical therapy, option to meet with back specialist depending on improvement with PT, or sooner if worse.  Has NSAID, muscle relaxant if needed.  RTC precautions given.  6-week recheck ? ?BMI 32.0-32.9,adult ?Obesity, improving with changes as above.  Watch diet, add in some form of exercise most days per week, low intensity with back symptoms initially. ? ?No orders of the defined types were placed in this encounter. ? ?Patient Instructions  ?Keep up the good work with diet changes. Avoid fast food as much as possible. Walking, biking or other low intensity exercise most days per week when able.  ? ? ? ?Xray at [facility], I will refer you to physical therapy for back pain, then option to meet with back specialist if not improving, or worsening sooner.  ?Walk in 8:30-4:30 during weekdays, no appointment needed ?[place]  ?[place],  [id number] ? ?Sciatica ?Sciatica is pain, numbness, weakness, or tingling along the path of the sciatic nerve. The sciatic nerve starts in the lower back and runs down the back of each leg. The nerve controls the muscles in  the lower leg and in the back of the knee. It also provides feeling (sensation) to the back of the thigh, the lower leg, and the sole of the foot. Sciatica is a symptom of another medical condition that pinche

## 2022-02-04 NOTE — Patient Instructions (Addendum)
Keep up the good work with diet changes. Avoid fast food as much as possible. Walking, biking or other low intensity exercise most days per week when able.  ? ? ? ?Xray at [facility], I will refer you to physical therapy for back pain, then option to meet with back specialist if not improving, or worsening sooner.  ?Walk in 8:30-4:30 during weekdays, no appointment needed ?[place]  ?[place], [place] [id number] ? ?Sciatica ?Sciatica is pain, numbness, weakness, or tingling along the path of the sciatic nerve. The sciatic nerve starts in the lower back and runs down the back of each leg. The nerve controls the muscles in the lower leg and in the back of the knee. It also provides feeling (sensation) to the back of the thigh, the lower leg, and the sole of the foot. Sciatica is a symptom of another medical condition that pinches or puts pressure on the sciatic nerve. ?Sciatica most often only affects one side of the body. Sciatica usually goes away on its own or with treatment. In some cases, sciatica may come back (recur). ?What are the causes? ?This condition is caused by pressure on the sciatic nerve or pinching of the nerve. This may be the result of: ?A disk in between the bones of the spine bulging out too far (herniated disk). ?Age-related changes in the spinal disks. ?A pain disorder that affects a muscle in the buttock. ?Extra bone growth near the sciatic nerve. ?A break (fracture) of the pelvis. ?Pregnancy. ?Tumor. This is rare. ?What increases the risk? ?The following factors may make you more likely to develop this condition: ?Playing sports that place pressure or stress on the spine. ?Having poor strength and flexibility. ?A history of back injury or surgery. ?Sitting for long periods of time. ?Doing activities that involve repetitive bending or lifting. ?Obesity. ?What are the signs or symptoms? ?Symptoms can vary from mild to very severe, and they may include: ?Any of these problems in the lower back,  leg, hip, or buttock: ?Mild tingling, numbness, or dull aches. ?Burning sensations. ?Sharp pains. ?Numbness in the back of the calf or the sole of the foot. ?Leg weakness. ?Severe back pain that makes movement difficult. ?Symptoms may get worse when you cough, sneeze, or laugh, or when you sit or stand for long periods of time. ?How is this diagnosed? ?This condition may be diagnosed based on: ?Your symptoms and medical history. ?A physical exam. ?Blood tests. ?Imaging tests, such as: ?X-rays. ?MRI. ?CT scan. ?How is this treated? ?In many cases, this condition improves on its own without treatment. However, treatment may include: ?Reducing or modifying physical activity. ?Exercising and stretching. ?Icing and applying heat to the affected area. ?Medicines that help to: ?Relieve pain and swelling. ?Relax your muscles. ?Injections of medicines that help to relieve pain, irritation, and inflammation around the sciatic nerve (steroids). ?Surgery. ?Follow these instructions at home: ?Medicines ?Take over-the-counter and prescription medicines only as told by your health care provider. ?Ask your health care provider if the medicine prescribed to you: ?Requires you to avoid driving or using heavy machinery. ?Can cause constipation. You may need to take these actions to prevent or treat constipation: ?Drink enough fluid to keep your urine pale yellow. ?Take over-the-counter or prescription medicines. ?Eat foods that are high in fiber, such as beans, whole grains, and fresh fruits and vegetables. ?Limit foods that are high in fat and processed sugars, such as fried or sweet foods. ?Managing pain ?  ?If directed, put  ice on the affected area. ?Put ice in a plastic bag. ?Place a towel between your skin and the bag. ?Leave the ice on for 20 minutes, 2-3 times a day. ?If directed, apply heat to the affected area. Use the heat source that your health care provider recommends, such as a moist heat pack or a heating pad. ?Place a  towel between your skin and the heat source. ?Leave the heat on for 20-30 minutes. ?Remove the heat if your skin turns bright red. This is especially important if you are unable to feel pain, heat, or cold. You may have a greater risk of getting burned. ?Activity ? ?Return to your normal activities as told by your health care provider. Ask your health care provider what activities are safe for you. ?Avoid activities that make your symptoms worse. ?Take brief periods of rest throughout the day. ?When you rest for longer periods, mix in some mild activity or stretching between periods of rest. This will help to prevent stiffness and pain. ?Avoid sitting for long periods of time without moving. Get up and move around at least one time each hour. ?Exercise and stretch regularly, as told by your health care provider. ?Do not lift anything that is heavier than 10 lb (4.5 kg) while you have symptoms of sciatica. When you do not have symptoms, you should still avoid heavy lifting, especially repetitive heavy lifting. ?When you lift objects, always use proper lifting technique, which includes: ?Bending your knees. ?Keeping the load close to your body. ?Avoiding twisting. ?General instructions ?Maintain a healthy weight. Excess weight puts extra stress on your back. ?Wear supportive, comfortable shoes. Avoid wearing high heels. ?Avoid sleeping on a mattress that is too soft or too hard. A mattress that is firm enough to support your back when you sleep may help to reduce your pain. ?Keep all follow-up visits as told by your health care provider. This is important. ?Contact a health care provider if: ?You have pain that: ?Wakes you up when you are sleeping. ?Gets worse when you lie down. ?Is worse than you have experienced in the past. ?Lasts longer than 4 weeks. ?You have an unexplained weight loss. ?Get help right away if: ?You are not able to control when you urinate or have bowel movements (incontinence). ?You  have: ?Weakness in your lower back, pelvis, buttocks, or legs that gets worse. ?Redness or swelling of your back. ?A burning sensation when you urinate. ?Summary ?Sciatica is pain, numbness, weakness, or tingling along the path of the sciatic nerve. ?This condition is caused by pressure on the sciatic nerve or pinching of the nerve. ?Sciatica can cause pain, numbness, or tingling in the lower back, legs, hips, and buttocks. ?Treatment often includes rest, exercise, medicines, and applying ice or heat. ?This information is not intended to replace advice given to you by your health care provider. Make sure you discuss any questions you have with your health care provider. ?Document Revised: 11/16/2018 Document Reviewed: 11/16/2018 ?Elsevier Patient Education ? 2022 [place]. ? ? ?

## 2022-03-02 ENCOUNTER — Other Ambulatory Visit: Payer: Self-pay | Admitting: Registered Nurse

## 2022-03-02 DIAGNOSIS — I10 Essential (primary) hypertension: Secondary | ICD-10-CM

## 2022-03-04 ENCOUNTER — Encounter (HOSPITAL_BASED_OUTPATIENT_CLINIC_OR_DEPARTMENT_OTHER): Payer: Self-pay | Admitting: Physical Therapy

## 2022-03-04 ENCOUNTER — Ambulatory Visit (HOSPITAL_BASED_OUTPATIENT_CLINIC_OR_DEPARTMENT_OTHER): Payer: 59 | Attending: Family Medicine | Admitting: Physical Therapy

## 2022-03-04 DIAGNOSIS — M25551 Pain in right hip: Secondary | ICD-10-CM | POA: Insufficient documentation

## 2022-03-04 DIAGNOSIS — M5431 Sciatica, right side: Secondary | ICD-10-CM | POA: Insufficient documentation

## 2022-03-04 DIAGNOSIS — M5459 Other low back pain: Secondary | ICD-10-CM | POA: Insufficient documentation

## 2022-03-04 NOTE — Therapy (Signed)
?OUTPATIENT PHYSICAL THERAPY THORACOLUMBAR EVALUATION ? ? ?Patient Name: Ethan Knox ?MRN: [id number] ?DOB:1971/08/06, 51 y.o., male ?Today's Date: 03/05/2022 ? ? PT End of Session - 03/04/22 1014   ? ? Visit Number 1   ? Number of Visits 7   ? Date for PT Re-Evaluation 04/19/22   ? Authorization Type UHC   ? PT Start Time 1014   ? PT Stop Time 1059   ? PT Time Calculation (min) 45 min   ? Activity Tolerance Patient tolerated treatment well   ? Behavior During Therapy [facility] for tasks assessed/performed   ? ?  ?  ? ?  ? ? ?Past Medical History:  ?Diagnosis Date  ? Depression   ? HSV-2 (herpes simplex virus 2) infection   ? Hypertension   ? Kidney stone   ? Sleep disorder, shift work   ? ?Past Surgical History:  ?Procedure Laterality Date  ? BIOPSY  06/08/2020  ? Procedure: BIOPSY;  Surgeon: Milus Banister, MD;  Location: [facility];  Service: Endoscopy;;  ? ESOPHAGOGASTRODUODENOSCOPY (EGD) WITH PROPOFOL N/A 06/08/2020  ? Procedure: ESOPHAGOGASTRODUODENOSCOPY (EGD) WITH PROPOFOL;  Surgeon: Milus Banister, MD;  Location: [facility];  Service: Endoscopy;  Laterality: N/A;  ? ESOPHAGOGASTRODUODENOSCOPY (EGD) WITH PROPOFOL N/A 08/30/2021  ? Procedure: ESOPHAGOGASTRODUODENOSCOPY (EGD) WITH PROPOFOL;  Surgeon: Milus Banister, MD;  Location: [facility];  Service: Endoscopy;  Laterality: N/A;  ? EUS N/A 06/08/2020  ? Procedure: UPPER ENDOSCOPIC ULTRASOUND (EUS) RADIAL;  Surgeon: Milus Banister, MD;  Location: [facility];  Service: Endoscopy;  Laterality: N/A;  ? EUS N/A 08/30/2021  ? Procedure: UPPER ENDOSCOPIC ULTRASOUND (EUS) RADIAL;  Surgeon: Milus Banister, MD;  Location: [facility];  Service: Endoscopy;  Laterality: N/A;  ? VASECTOMY    ? ?Patient Active Problem List  ? Diagnosis Date Noted  ? Gastric nodule   ? Heart murmur 01/11/2020  ? Essential hypertension 12/14/2019  ? Visit for suture removal 12/14/2019  ? HSV-2 (herpes simplex virus 2) infection   ? Sleep disorder, shift work   ? ? ?PCP:  [name], MD ? ?REFERRING PROVIDER: [name], MD ? ?REFERRING DIAG: M54.31 (ICD-10-CM) - Sciatica of right side ? ?THERAPY DIAG:  ?Other low back pain ? ?Pain in right hip ? ?ONSET DATE: subacute on chronic ? ?SUBJECTIVE:                                                                                                                                                                                          ? ?SUBJECTIVE STATEMENT: ?Began hurting a long time ago. Just recently it has gotten to where  it hurts to do anything. Right sided pain around hip that radiates to lateral knee. Occasionally has pain on left side but not as bad as Rt.  ?PERTINENT HISTORY:  ?Recent move from mobile job to more seated ? ?PAIN:  ?Are you having pain? Yes: NPRS scale: 2-3 at rest/10 ?Pain location: Rt lower back & hip ?Pain description: it just hurts ?Aggravating factors: unknown ?Relieving factors: pressure and heat ? ? ?PRECAUTIONS: None ? ?WEIGHT BEARING RESTRICTIONS No ? ?FALLS:  ?Has patient fallen in last 6 months? No ? ?LIVING ENVIRONMENT: ?Lives with: lives with their family and lives with their spouse ? ? ?OCCUPATION: Engineer, structural ? ?PLOF: Independent ? 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 ? ?OBJECTIVE:  ? ?DIAGNOSTIC FINDINGS:  ?Xray 3/27: ?FINDINGS: ?There is no evidence of lumbar spine fracture. Minimal grade 1 ?retrolisthesis of L4-5 is noted secondary to mild degenerative disc ?disease at this level. Mild degenerative disc disease is also noted ?at L5-S1. ? ?PATIENT SURVEYS:  ?FOTO 48 ? ?SCREENING FOR RED FLAGS: ?No findings ? ?COGNITION: ? Overall cognitive status: Within functional limits for tasks assessed   ?  ?SENSATION: ?WFL ? ? ?POSTURE:  ?Lt innom elevation ant & post in standing; supine LLD- Lt shorter (appears functional) ? ?PALPATION: ?Tightness in Rt pirifrmis, glut med/min/TFL ? ?LUMBAR ROM:   ? ?Active  A/PROM  ?03/04/22  ?Flexion Mid shin  ?Extension   ?Right lateral flexion   ?Left lateral flexion   ?Right rotation   ?Left rotation   ? (Blank rows = not tested) ? ? ?LE MMT: ? ?MMT Right ?03/05/2022 Left ?03/05/2022  ?Hip flexion    ?Hip extension    ?Hip abduction    ?Hip adduction    ?Hip internal rotation    ?Hip external rotation    ?Knee flexion    ?Knee extension    ?Ankle dorsiflexion    ?Ankle plantarflexion    ?Ankle inversion    ?Ankle eversion    ? (Blank rows = not tested) ? ? ?GAIT: ?WFL ? ? ? ?TODAY'S TREATMENT  ?EVAL ?Hesch- self correction for Rt post inom x2 rounds ?Figure 4 stretch ?Hooklying transv abd engagement ?Hooklying TrA+ add, + UE flexion- in hooklying & tabletop ?MANUAL: STM Rt hip in Lt SL ? ? ?PATIENT EDUCATION:  ?Education details: Anatomy of condition, POC, HEP, exercise form/rationale ? ?Person educated: Patient ?Education method: Explanation, Demonstration, Tactile cues, Verbal cues, and Handouts ?Education comprehension: verbalized understanding, returned demonstration, verbal cues required, tactile cues required, and needs further education ? ? ?HOME EXERCISE PROGRAM: ?[place] ? ?Hesch self correction for Rt post innom.  ? ?ASSESSMENT: ? ?CLINICAL IMPRESSION: ?Patient is a 51 y.o. M who was seen today for physical therapy evaluation and treatment for subacute on chronic Rt lower back and hip pain.   ? ? ?OBJECTIVE IMPAIRMENTS decreased activity tolerance, increased muscle spasms, improper body mechanics, postural dysfunction, and pain.  ? ?ACTIVITY LIMITATIONS driving and occupation.  ? ?PERSONAL FACTORS Time since onset of injury/illness/exacerbation are also affecting patient's functional outcome.  ? ? ?REHAB POTENTIAL: Good ? ?CLINICAL DECISION MAKING: Stable/uncomplicated ? ?EVALUATION COMPLEXITY: Low ? ? ?GOALS: ?Goals reviewed with patient? Yes ? ?SHORT TERM GOALS: Target date: 03/19/2022 ? ?Able to utilize self correction for level pelvis ?Baseline: ?Goal status:  INITIAL ? ?2.  Able to demo  proper core contraction while breathing and keeping cervical muscles relaxed ?Baseline:  ?  Goal status: INITIAL ? ? ? ?LONG TERM GOALS: Target date: 04/16/2022 ? ?Gross hip strength within 10% of stated normative values in dynamometry for stability to pelvis ?Baseline: flexion: 41 lb, ABD 70lb ?Goal status: INITIAL ? ?2.  Pt will meet FOTO goal ?Baseline:  ?Goal status: INITIAL ? ?3.  Proper form demonstrated in strengthening activities for long term gym program ?Baseline:  ?Goal status: INITIAL ? ?4.  Able to return to mountain biking ?Baseline:  ?Goal status: INITIAL ? ?5.  Able to complete shift for work with minimal to no disruption from pain ?Baseline:  ?Goal status: INITIAL ? ? ? ? ?PLAN: ?PT FREQUENCY: 1x/week- due to work schedule ? ?PT DURATION: 6 weeks ? ?PLANNED INTERVENTIONS: Therapeutic exercises, Therapeutic activity, Neuromuscular re-education, Balance training, Gait training, Patient/Family education, Joint mobilization, Stair training, Aquatic Therapy, Dry Needling, Electrical stimulation, Spinal mobilization, Cryotherapy, Moist heat, Taping, and Manual therapy. ? ?PLAN FOR NEXT SESSION: recheck pelvis, progress core/glut stability, DN PRN to abductors ? ? ?Ethan Knox PT, DPT ?03/05/22 10:16 AM ? ? ?

## 2022-03-18 ENCOUNTER — Encounter (HOSPITAL_BASED_OUTPATIENT_CLINIC_OR_DEPARTMENT_OTHER): Payer: Self-pay | Admitting: Physical Therapy

## 2022-03-18 ENCOUNTER — Encounter: Payer: Self-pay | Admitting: Family Medicine

## 2022-03-18 ENCOUNTER — Ambulatory Visit (HOSPITAL_BASED_OUTPATIENT_CLINIC_OR_DEPARTMENT_OTHER): Payer: 59 | Attending: Family Medicine | Admitting: Physical Therapy

## 2022-03-18 ENCOUNTER — Ambulatory Visit: Payer: 59 | Admitting: Family Medicine

## 2022-03-18 VITALS — BP 128/74 | HR 62 | Temp 98.2°F | Resp 16 | Ht 75.0 in | Wt 252.0 lb

## 2022-03-18 DIAGNOSIS — M25551 Pain in right hip: Secondary | ICD-10-CM | POA: Insufficient documentation

## 2022-03-18 DIAGNOSIS — M5441 Lumbago with sciatica, right side: Secondary | ICD-10-CM

## 2022-03-18 DIAGNOSIS — M5459 Other low back pain: Secondary | ICD-10-CM | POA: Diagnosis present

## 2022-03-18 NOTE — Progress Notes (Signed)
? ?Subjective:  ?Patient ID: Ethan Knox, male    DOB: [birth year]  Age: 51 y.o. MRN: [id number] ? ?CC:  ?Chief Complaint  ?Patient presents with  ? Back Pain  ?  Pt reports was feeling really good after PT about two weeks ago but as of yesterday started paining again  ? ? ?HPI ?Ethan Knox presents for  ? ?Right-sided low back pain: ?Follow-up from March 27.  Initially seen by my colleague 01/01/2022 acute back pain treated with Depo-Medrol injection, Voltaren, methocarbamol. ?Did report intermittent symptoms in the past, been more persistent recently with right sciatica symptoms.  Referred to physical therapy, imaging with LS spine x-ray 02/04/2022, mild multilevel degenerative disc disease but no acute abnormality.  Minimal grade 1 retrolisthesis of L4 on 5 secondary to mild degenerative disc disease at that level. ? ?Status post physical therapy, 1 visit on 4/24, repeat treatment today. Felt much better after initial PT session. HEP given, has been doing those 2 times per day. Initially improved, soreness returned yesterday.  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  ? Still R sided pain, R low back. No leg radiation.  ?No bowel or bladder incontinence, no saddle anesthesia, no lower extremity weakness. No meds needed.  ?Also losing weight with snacks, quality of meals.  ? ?Wt Readings from Last 3 Encounters:  ?03/18/22 252 lb (114.3 kg)  ?02/04/22 257 lb (116.6 kg)  ?01/01/22 261 lb 3.2 oz (118.5 kg)  ? ? ?History ?Patient Active Problem List  ? Diagnosis Date Noted  ? Gastric nodule   ? Heart murmur 01/11/2020  ? Essential hypertension 12/14/2019  ? Visit for suture removal 12/14/2019  ? HSV-2 (herpes simplex virus 2) infection   ? Sleep disorder, shift work   ? ?Past Medical History:  ?Diagnosis Date  ? Depression   ? HSV-2 (herpes simplex virus 2) infection   ? Hypertension   ? Kidney stone   ? Sleep disorder, shift  work   ? ?Past Surgical History:  ?Procedure Laterality Date  ? BIOPSY  06/08/2020  ? Procedure: BIOPSY;  Surgeon: Milus Banister, MD;  Location: [facility];  Service: Endoscopy;;  ? ESOPHAGOGASTRODUODENOSCOPY (EGD) WITH PROPOFOL N/A 06/08/2020  ? Procedure: ESOPHAGOGASTRODUODENOSCOPY (EGD) WITH PROPOFOL;  Surgeon: Milus Banister, MD;  Location: [facility];  Service: Endoscopy;  Laterality: N/A;  ? ESOPHAGOGASTRODUODENOSCOPY (EGD) WITH PROPOFOL N/A 08/30/2021  ? Procedure: ESOPHAGOGASTRODUODENOSCOPY (EGD) WITH PROPOFOL;  Surgeon: Milus Banister, MD;  Location: [facility];  Service: Endoscopy;  Laterality: N/A;  ? EUS N/A 06/08/2020  ? Procedure: UPPER ENDOSCOPIC ULTRASOUND (EUS) RADIAL;  Surgeon: Milus Banister, MD;  Location: [facility];  Service: Endoscopy;  Laterality: N/A;  ? EUS N/A 08/30/2021  ? Procedure: UPPER ENDOSCOPIC ULTRASOUND (EUS) RADIAL;  Surgeon: Milus Banister, MD;  Location: [facility];  Service: Endoscopy;  Laterality: N/A;  ? VASECTOMY    ? ?Allergies  ?Allergen Reactions  ? Prilosec [Omeprazole Magnesium] Rash  ? ?Prior to Admission medications   ?Medication Sig Start Date End Date Taking? Authorizing Provider  ?cetirizine (ZYRTEC) 10 MG tablet Take 10 mg by mouth daily.   Yes [provider]  ?diclofenac (VOLTAREN) 75 MG EC tablet Take 1 tablet (75 mg total) by mouth 2 (two) times daily. 01/01/22  Yes Maximiano Coss, NP  ?DULoxetine (CYMBALTA) 60 MG capsule Take 1 capsule (60 mg total) by mouth daily. 09/12/21  Yes Maximiano Coss, NP  ?fluticasone (FLONASE) 50 MCG/ACT nasal spray Place 1 spray into both nostrils daily as needed for allergies or rhinitis.   Yes [provider]  ?lisinopril-hydrochlorothiazide (ZESTORETIC) 20-12.5 MG tablet TAKE 1 TABLET BY MOUTH EVERY DAY 03/02/22  Yes [name], MD  ?methocarbamol (ROBAXIN) 500 MG tablet Take 1 tablet (500 mg total) by mouth 4 (four) times daily. 01/01/22  Yes Maximiano Coss, NP  ?pantoprazole  (PROTONIX) 40 MG tablet Take 1 tablet (40 mg total) by mouth daily. 09/12/21  Yes Maximiano Coss, NP  ?valACYclovir (VALTREX) 500 MG tablet Take 1 tablet (500 mg total) by mouth daily. 09/12/21  Yes Maximiano Coss, NP  ?zolpidem (AMBIEN) 10 MG tablet Take 0.5 tablets (5 mg total) by mouth at bedtime as needed for sleep. 09/12/21  Yes Maximiano Coss, NP  ? ?Social History  ? ?Socioeconomic History  ? Marital status: Married  ?  Spouse name: Ethan Knox  ? Number of children: 1  ? Years of education: 80  ? Highest education level: Not on file  ?Occupational History  ? Occupation: Engineer, structural  ?Tobacco Use  ? Smoking status: Some Days  ?  Types: Cigars  ? Smokeless tobacco: Never  ?Vaping Use  ? Vaping Use: Never used  ?Substance and Sexual Activity  ? Alcohol use: Yes  ?  Comment: occ  ? Drug use: No  ? Sexual activity: Yes  ?  Comment: vasectomy  ?Other Topics Concern  ? Not on file  ?Social History Narrative  ? Lives with his wife.  His 33 year old son lives in [place].  ? ?Social Determinants of Health  ? ?Financial Resource Strain: Not on file  ?Food Insecurity: Not on file  ?Transportation Needs: Not on file  ?Physical Activity: Not on file  ?Stress: Not on file  ?Social Connections: Not on file  ?Intimate Partner Violence: Not on file  ? ? ?Review of Systems ? ? ?Objective:  ? ?Vitals:  ? 03/18/22 1020  ?BP: 128/74  ?Pulse: 62  ?Resp: 16  ?Temp: 98.2 ?F (36.8 ?C)  ?TempSrc: Temporal  ?SpO2: 94%  ?Weight: 252 lb (114.3 kg)  ?Height: 6' 3"  (1.905 m)  ? ? ? ?Physical Exam ?Constitutional:   ?   General: He is not in acute distress. ?   Appearance: Normal appearance. He is well-developed.  ?HENT:  ?   Head: Normocephalic and atraumatic.  ?Cardiovascular:  ?   Rate and Rhythm: Normal rate.  ?Pulmonary:  ?   Effort: Pulmonary effort is normal.  ?Musculoskeletal:  ?   Comments: Lumbar spine, no focal or midline bony tenderness.  Negative seated straight leg raise.  Ambulating without assistive device or difficulty.   Flexion approximately 80 degrees, slight decrease right greater than left lateral flexion, equal rotation.  ?Neurological:  ?   Mental Status: He is alert and oriented to person, place, and time.  ?Psychiatric:     ?   Mood and Affect: Mood normal.  ? ? ? ? ? ?Assessment & Plan:  ?Ethan Knox is a 51 y.o. male . ?Acute right-sided low back pain with right-sided sciatica ?Improved with single episode of PT, recent recurrence likely due to overuse, increased biking.  Repeat PT planned for today.  Has NSAID, muscle aches and if needed but anticipate continued physical therapy with home exercise program will be helpful.  Also commended on weight loss which should help as well.  With underlying DDD, would consider meeting with back specialist  if persistent symptoms to decide on advanced imaging versus injection versus continued PT.  RTC precautions given.  69-monthfollow-up for chronic med review. ? ?No orders of the defined types were placed in this encounter. ? ?Patient Instructions  ?I expect PT will continue to help. Let me know if you hit a plateau or not improving, and I am happy to refer you to a bike specialist.  ?Keep up the good work with weight loss.  ? ? ? ? ? ?Signed,  ? ?JMerri Ray MD ?[facility]?[place]?03/18/22 ?10:58 AM ? ? ?

## 2022-03-18 NOTE — Therapy (Signed)
?OUTPATIENT PHYSICAL THERAPY TREATMENT ? ? ?Patient Name: Ethan Knox ?MRN: [id number] ?DOB:03/10/1971, 51 y.o., male ?Today's Date: 03/18/2022 ? ? PT End of Session - 03/18/22 1253   ? ? Visit Number 2   ? Number of Visits 7   ? Date for PT Re-Evaluation 04/19/22   ? Authorization Type UHC   ? PT Start Time 1300   ? PT Stop Time 1340   ? PT Time Calculation (min) 40 min   ? Activity Tolerance Patient tolerated treatment well   ? Behavior During Therapy [facility] for tasks assessed/performed   ? ?  ?  ? ?  ? ? ? ?Past Medical History:  ?Diagnosis Date  ? Depression   ? HSV-2 (herpes simplex virus 2) infection   ? Hypertension   ? Kidney stone   ? Sleep disorder, shift work   ? ?Past Surgical History:  ?Procedure Laterality Date  ? BIOPSY  06/08/2020  ? Procedure: BIOPSY;  Surgeon: Milus Banister, MD;  Location: [facility];  Service: Endoscopy;;  ? ESOPHAGOGASTRODUODENOSCOPY (EGD) WITH PROPOFOL N/A 06/08/2020  ? Procedure: ESOPHAGOGASTRODUODENOSCOPY (EGD) WITH PROPOFOL;  Surgeon: Milus Banister, MD;  [facility];  Service: Endoscopy;  Laterality: N/A;  ? ESOPHAGOGASTRODUODENOSCOPY (EGD) WITH PROPOFOL N/A 08/30/2021  ? Procedure: ESOPHAGOGASTRODUODENOSCOPY (EGD) WITH PROPOFOL;  Surgeon: Milus Banister, MD;  Location: [facility];  Service: Endoscopy;  Laterality: N/A;  ? EUS N/A 06/08/2020  ? Procedure: UPPER ENDOSCOPIC ULTRASOUND (EUS) RADIAL;  Surgeon: Milus Banister, MD;  Location[facility];  Service: Endoscopy;  Laterality: N/A;  ? EUS N/A 08/30/2021  ? Procedure: UPPER ENDOSCOPIC ULTRASOUND (EUS) RADIAL;  Surgeon: Milus Banister, MD;  Location: [facility];  Service: Endoscopy;  Laterality: N/A;  ? VASECTOMY    ? ?Patient Active Problem List  ? Diagnosis Date Noted  ? Gastric nodule   ? Heart murmur 01/11/2020  ? Essential hypertension 12/14/2019  ? Visit for suture removal 12/14/2019  ? HSV-2 (herpes simplex virus 2) infection   ? Sleep disorder, shift work   ? ? ?PCP: [name],  MD ? ?REFERRING PROVIDER: [name], MD ? ?REFERRING DIAG: M54.31 (ICD-10-CM) - Sciatica of right side ? ?THERAPY DIAG:  ?Other low back pain ? ?Pain in right hip ? ?ONSET DATE: subacute on chronic ? ?SUBJECTIVE:                                                                                                                                                                                          ? ?SUBJECTIVE STATEMENT: ?Pt states he was riding more over the last few days and had  some pain increase into the hip. It is not the same type radiating pain. ? ?PERTINENT HISTORY:  ?Recent move from mobile job to more seated ? ?PAIN:  ?Are you having pain? Yes: NPRS scale: 1/10 ?Pain location: Rt lower back & hip ?Pain description: it just hurts ?Aggravating factors: unknown ?Relieving factors: pressure and heat ? ? ?PRECAUTIONS: None ? ?WEIGHT BEARING RESTRICTIONS No ? ?FALLS:  ?Has patient fallen in last 6 months? No ? ?LIVING ENVIRONMENT: ?Lives with: lives with their family and lives with their spouse ? ? ?OCCUPATION: Engineer, structural ? ?PLOF: Independent ? 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 ? ?OBJECTIVE:  ? ?DIAGNOSTIC FINDINGS:  ?Xray 3/27: ?FINDINGS: ?There is no evidence of lumbar spine fracture. Minimal grade 1 ?retrolisthesis of L4-5 is noted secondary to mild degenerative disc ?disease at this level. Mild degenerative disc disease is also noted ?at L5-S1. ? ?PATIENT SURVEYS:  ?FOTO 48 ? ?TODAY'S TREATMENT  ?5/8 ? ?Manual:  STM R hip rotators, R QL; L2-5 CPA and UPA bilat grade III ? ?Standing hip flexor stretch 30s 3x ?STS with band at knees blue 2x10 ?RDL 15lb kb 2x10 ?Sidestepping with blue TB at knees 11f x2 ? ? ?EVAL ?Hesch- self correction for Rt post inom x2 rounds ?Figure 4 stretch ?Hooklying transv abd engagement ?Hooklying TrA+ add, + UE flexion- in hooklying & tabletop ?MANUAL: STM Rt hip in Lt  SL ? ? ?PATIENT EDUCATION:  ?Education details: Anatomy of condition, POC, HEP, exercise form/rationale ? ?Person educated: Patient ?Education method: Explanation, Demonstration, Tactile cues, Verbal cues, and Handouts ?Education comprehension: verbalized understanding, returned demonstration, verbal cues required, tactile cues required, and needs further education ? ? ?HOME EXERCISE PROGRAM: ?[place]? ?Hesch self correction for Rt post innom.  ? ?ASSESSMENT: ? ?CLINICAL IMPRESSION: ?Pt able to continue with progress of lumbopelvic strengthening at this session. Pt does demo R sided lumbar and hip stiffness that improved with manual therapy. Pt was able to progress HEP at today's without pain. Pt does have concurrent anterior hip stiffness that may be associated with his LBP. 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 Pt would benefit from continued skilled therapy in order to reach goals and maximize functional lumbopelvic strength and ROM for full return to PLOF. .   ? ? ?OBJECTIVE IMPAIRMENTS decreased activity tolerance, increased muscle spasms, improper body mechanics, postural dysfunction, and pain.  ? ?ACTIVITY LIMITATIONS driving and occupation.  ? ?PERSONAL FACTORS Time since onset of injury/illness/exacerbation are also affecting patient's functional outcome.  ? ? ?REHAB POTENTIAL: Good ? ?CLINICAL DECISION MAKING: Stable/uncomplicated ? ?EVALUATION COMPLEXITY: Low ? ? ?GOALS: ?Goals reviewed with patient? Yes ? ?SHORT TERM GOALS: Target date: 04/01/2022 ? ?Able to utilize self correction for level pelvis ?Baseline: ?Goal status: INITIAL ? ?2.  Able to demo  proper core contraction while breathing and keeping cervical muscles relaxed ?Baseline:  ?Goal status: INITIAL ? ? ? ?LONG TERM GOALS: Target date: 04/29/2022 ? ?Gross hip strength within 10% of stated normative values in dynamometry for stability to pelvis ?Baseline:  flexion: 41 lb, ABD 70lb ?Goal status: INITIAL ? ?2.  Pt will meet FOTO goal ?Baseline:  ?Goal status: INITIAL ? ?3.  Proper form demonstrated in strengthening activities for long term gym program ?Baseline:  ?Goal status: INITIAL ? ?4.  Able to return to mountain biking ?Baseline:  ?  Goal status: INITIAL ? ?5.  Able to complete shift for work with minimal to no disruption from pain ?Baseline:  ?Goal status: INITIAL ? ? ? ? ?PLAN: ?PT FREQUENCY: 1x/week- due to work schedule ? ?PT DURATION: 6 weeks ? ?PLANNED INTERVENTIONS: Therapeutic exercises, Therapeutic activity, Neuromuscular re-education, Balance training, Gait training, Patient/Family education, Joint mobilization, Stair training, Aquatic Therapy, Dry Needling, Electrical stimulation, Spinal mobilization, Cryotherapy, Moist heat, Taping, and Manual therapy. ? ?PLAN FOR NEXT SESSION: recheck pelvis, progress core/glut stability, DN PRN to abductors ? ?Daleen Bo PT, DPT ?03/18/22 1:47 PM ? ? ? ?

## 2022-03-18 NOTE — Patient Instructions (Addendum)
I expect PT will continue to help. Let me know if you hit a plateau or not improving, and I am happy to refer you to a bike specialist.  ?Keep up the good work with weight loss.  ? ? ?

## 2022-03-25 ENCOUNTER — Ambulatory Visit (HOSPITAL_BASED_OUTPATIENT_CLINIC_OR_DEPARTMENT_OTHER): Payer: 59 | Admitting: Physical Therapy

## 2022-03-25 ENCOUNTER — Encounter (HOSPITAL_BASED_OUTPATIENT_CLINIC_OR_DEPARTMENT_OTHER): Payer: Self-pay | Admitting: Physical Therapy

## 2022-03-25 DIAGNOSIS — M5459 Other low back pain: Secondary | ICD-10-CM | POA: Diagnosis not present

## 2022-03-25 DIAGNOSIS — M25551 Pain in right hip: Secondary | ICD-10-CM

## 2022-03-25 NOTE — Therapy (Signed)
?OUTPATIENT PHYSICAL THERAPY TREATMENT ? ? ?Patient Name: Ethan Knox ?MRN: [id number] ?DOB:07/17/1971, 51 y.o., male ?Today's Date: 03/25/2022 ? ? PT End of Session - 03/25/22 1110   ? ? Visit Number 3   ? Number of Visits 7   ? Date for PT Re-Evaluation 04/19/22   ? Authorization Type UHC   ? PT Start Time 1102   ? PT Stop Time 1145   ? PT Time Calculation (min) 43 min   ? Activity Tolerance Patient tolerated treatment well   ? Behavior During Therapy [facility] for tasks assessed/performed   ? ?  ?  ? ?  ? ? ? ? ?Past Medical History:  ?Diagnosis Date  ? Depression   ? HSV-2 (herpes simplex virus 2) infection   ? Hypertension   ? Kidney stone   ? Sleep disorder, shift work   ? ?Past Surgical History:  ?Procedure Laterality Date  ? BIOPSY  06/08/2020  ? Procedure: BIOPSY;  Surgeon: Milus Banister, MD;  Location: [facility];  Service: Endoscopy;;  ? ESOPHAGOGASTRODUODENOSCOPY (EGD) WITH PROPOFOL N/A 06/08/2020  ? Procedure: ESOPHAGOGASTRODUODENOSCOPY (EGD) WITH PROPOFOL;  Surgeon: Milus Banister, MD;  Location: [facility];  Service: Endoscopy;  Laterality: N/A;  ? ESOPHAGOGASTRODUODENOSCOPY (EGD) WITH PROPOFOL N/A 08/30/2021  ? Procedure: ESOPHAGOGASTRODUODENOSCOPY (EGD) WITH PROPOFOL;  Surgeon: Milus Banister, MD;  Location: [facility];  Service: Endoscopy;  Laterality: N/A;  ? EUS N/A 06/08/2020  ? Procedure: UPPER ENDOSCOPIC ULTRASOUND (EUS) RADIAL;  Surgeon: Milus Banister, MD;  Location: [facility];  Service: Endoscopy;  Laterality: N/A;  ? EUS N/A 08/30/2021  ? Procedure: UPPER ENDOSCOPIC ULTRASOUND (EUS) RADIAL;  Surgeon: Milus Banister, MD;  Location: [facility];  Service: Endoscopy;  Laterality: N/A;  ? VASECTOMY    ? ?Patient Active Problem List  ? Diagnosis Date Noted  ? Gastric nodule   ? Heart murmur 01/11/2020  ? Essential hypertension 12/14/2019  ? Visit for suture removal 12/14/2019  ? HSV-2 (herpes simplex virus 2) infection   ? Sleep disorder, shift work   ? ? ?PCP: [name], MD ? ?REFERRING PROVIDER: [name], MD ? ?REFERRING DIAG: M54.31 (ICD-10-CM) - Sciatica of right side ? ?THERAPY DIAG:  ?Other low back pain ? ?Pain in right hip ? ?ONSET DATE: subacute on chronic ? ?SUBJECTIVE:                                                                                                                                                                                          ? ?SUBJECTIVE STATEMENT: ?Pt states he has not had any pain at all since last  session. He states it will feel tight in the front of the R hip. The back of the R hip has "felt great."  ? ?PERTINENT HISTORY:  ?Recent move from mobile job to more seated ? ?PAIN:  ?Are you having pain? No: NPRS scale: 0/10 ?Pain location: Rt lower back & hip ?Pain description: it just hurts ?Aggravating factors: unknown ?Relieving factors: pressure and heat ? ? ?PRECAUTIONS: None ? ?WEIGHT BEARING RESTRICTIONS No ? ?FALLS:  ?Has patient fallen in last 6 months? No ? ?LIVING ENVIRONMENT: ?Lives with: lives with their family and lives with their spouse ? ? ?OCCUPATION: Engineer, structural ? ?PLOF: Independent ? 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 ? ?OBJECTIVE:  ? ?DIAGNOSTIC FINDINGS:  ?Xray 3/27: ?FINDINGS: ?There is no evidence of lumbar spine fracture. Minimal grade 1 ?retrolisthesis of L4-5 is noted secondary to mild degenerative disc ?disease at this level. Mild degenerative disc disease is also noted ?at L5-S1. ? ?PATIENT SURVEYS:  ?FOTO 48 ? ?TODAY'S TREATMENT  ? ?5/15 ? ?Manual:  STM R hip flexors ? ?Prone/quad hip flexor stretch 30s 3x ?Child's pose 10s 10x ?Paloff press Blue TB 2x10 each way ?Sidestepping with blue TB at knees 55f x2 ?Standing hip flexor stretch 30s 2x ? ?5/8 ? ?Manual:  STM R hip rotators, R QL; L2-5 CPA and UPA bilat grade III ? ?Standing hip flexor stretch 30s 3x ?STS with band at knees blue 2x10 ?RDL 15lb kb  2x10 ?Sidestepping with blue TB at knees 333fx2 ? ? ?EVAL ?Hesch- self correction for Rt post inom x2 rounds ?Figure 4 stretch ?Hooklying transv abd engagement ?Hooklying TrA+ add, + UE flexion- in hooklying & tabletop ?MANUAL: STM Rt hip in Lt SL ? ? ?PATIENT EDUCATION:  ?Education details: Anatomy of condition, POC, HEP, exercise form/rationale ? ?Person educated: Patient ?Education method: Explanation, Demonstration, Tactile cues, Verbal cues, and Handouts ?Education comprehension: verbalized understanding, returned demonstration, verbal cues required, tactile cues required, and needs further education ? ? ?HOME EXERCISE PROGRAM: ?Access Code: [email or web address] ?URL: [email or web address] ?Date: 03/25/2022 ?Prepared by: AlDaleen Bo ?Exercises ?- Cat Cow  - 1 x daily - 7 x weekly - 1 sets - 10 reps ?- Prone Quad Stretch with Towel Roll and Strap  - 1 x daily - 7 x weekly - 3 sets - 10 reps ?- Sit to Stand with Resistance Around Legs  - 1 x daily - 3 x weekly - 3 sets - 10 reps ?- Side Stepping with Resistance at Thighs  - 1 x daily - 3 x weekly - 1 sets - 3 reps ?- Half Deadlift with Kettlebell  - 1 x daily - 3 x weekly - 3 sets - 10 reps ?- Standing Anti-Rotation Press with Anchored Resistance  - 1 x daily - 3 x weekly - 3 sets - 10 reps ? ?ASSESSMENT: ? ?CLINICAL IMPRESSION: ?Pt with anterior R hip pain into TFL that is likely due to hip ER position with sidestepping exercise. Pt requires VC and TC for hip position as well as hip hinging motion during lifting exercise. Pt able to introduce anti-rotational stability exercise as well today without causing hip or back pain. Pt's anterior hip tightness also lkely affecting back pain so hip flexor stretch provided for home. If no pain increased pain, plan to trial hip thrust/weighted brigdge off bench at next session. Pt would benefit from continued skilled therapy in order to  reach goals and maximize functional lumbopelvic strength and ROM for full return to  PLOF.    ? ? ?OBJECTIVE IMPAIRMENTS decreased activity tolerance, increased muscle spasms, improper body mechanics, postural dysfunction, and pain.  ? ?ACTIVITY LIMITATIONS driving and occupation.  ? ?PERSONAL FACTORS Time since onset of injury/illness/exacerbation are also affecting patient's functional outcome.  ? ? ?REHAB POTENTIAL: Good ? ?CLINICAL DECISION MAKING: Stable/uncomplicated ? ?EVALUATION COMPLEXITY: Low ? ? ?GOALS: ?Goals reviewed with patient? Yes ? ?SHORT TERM GOALS: Target date: 04/08/2022 ? ?Able to utilize self correction for level pelvis ?Baseline: ?Goal status: INITIAL ? ?2.  Able to demo  proper core contraction while breathing and keeping cervical muscles relaxed ?Baseline:  ?Goal status: INITIAL ? ? ? ?LONG TERM GOALS: Target date: 05/06/2022 ? ?Gross hip strength within 10% of stated normative values in dynamometry for stability to pelvis ?Baseline: flexion: 41 lb, ABD 70lb ?Goal status: INITIAL ? ?2.  Pt will meet FOTO goal ?Baseline:  ?Goal status: INITIAL ? ?3.  Proper form demonstrated in strengthening activities for long term gym program ?Baseline:  ?Goal status: INITIAL ? ?4.  Able to return to mountain biking ?Baseline:  ?Goal status: INITIAL ? ?5.  Able to complete shift for work with minimal to no disruption from pain ?Baseline:  ?Goal status: INITIAL ? ? ? ? ?PLAN: ?PT FREQUENCY: 1x/week- due to work schedule ? ?PT DURATION: 6 weeks ? ?PLANNED INTERVENTIONS: Therapeutic exercises, Therapeutic activity, Neuromuscular re-education, Balance training, Gait training, Patient/Family education, Joint mobilization, Stair training, Aquatic Therapy, Dry Needling, Electrical stimulation, Spinal mobilization, Cryotherapy, Moist heat, Taping, and Manual therapy. ? ?PLAN FOR NEXT SESSION: recheck pelvis, progress core/glut stability, DN PRN to abductors ? ?Daleen Bo PT, DPT ?03/25/22 11:48 AM ? ? ? ?

## 2022-04-01 ENCOUNTER — Ambulatory Visit (HOSPITAL_BASED_OUTPATIENT_CLINIC_OR_DEPARTMENT_OTHER): Payer: 59 | Admitting: Physical Therapy

## 2022-04-01 ENCOUNTER — Encounter (HOSPITAL_BASED_OUTPATIENT_CLINIC_OR_DEPARTMENT_OTHER): Payer: Self-pay | Admitting: Physical Therapy

## 2022-04-01 DIAGNOSIS — M25551 Pain in right hip: Secondary | ICD-10-CM

## 2022-04-01 DIAGNOSIS — M5459 Other low back pain: Secondary | ICD-10-CM | POA: Diagnosis not present

## 2022-04-01 NOTE — Therapy (Signed)
OUTPATIENT PHYSICAL THERAPY TREATMENT   Patient Name: Ethan Knox MRN: [id number] DOB:01/23/1971, 51 y.o., male Today's Date: 04/01/2022   PT End of Session - 04/01/22 1050     Visit Number 4    Number of Visits 7    Date for PT Re-Evaluation 04/19/22    Authorization Type UHC    PT Start Time 3235    PT Stop Time 1055    PT Time Calculation (min) 40 min    Activity Tolerance Patient tolerated treatment well    Behavior During Therapy [facility] for tasks assessed/performed                Past Medical History:  Diagnosis Date   Depression    HSV-2 (herpes simplex virus 2) infection    Hypertension    Kidney stone    Sleep disorder, shift work    Past Surgical History:  Procedure Laterality Date   BIOPSY  06/08/2020   Procedure: BIOPSY;  Surgeon: Milus Banister, MD;  Location: [facility];  Service: Endoscopy;;   ESOPHAGOGASTRODUODENOSCOPY (EGD) WITH PROPOFOL N/A 06/08/2020   Procedure: ESOPHAGOGASTRODUODENOSCOPY (EGD) WITH PROPOFOL;  Surgeon: Milus Banister, MD;  Location: [facility];  Service: Endoscopy;  Laterality: N/A;   ESOPHAGOGASTRODUODENOSCOPY (EGD) WITH PROPOFOL N/A 08/30/2021   Procedure: ESOPHAGOGASTRODUODENOSCOPY (EGD) WITH PROPOFOL;  Surgeon: Milus Banister, MD;  Location: [facility];  Service: Endoscopy;  Laterality: N/A;   EUS N/A 06/08/2020   Procedure: UPPER ENDOSCOPIC ULTRASOUND (EUS) RADIAL;  Surgeon: Milus Banister, MD;  Location: [facility];  Service: Endoscopy;  Laterality: N/A;   EUS N/A 08/30/2021   Procedure: UPPER ENDOSCOPIC ULTRASOUND (EUS) RADIAL;  Surgeon: Milus Banister, MD;  Location: [facility];  Service: Endoscopy;  Laterality: N/A;   VASECTOMY     Patient Active Problem List   Diagnosis Date Noted   Gastric nodule    Heart murmur 01/11/2020   Essential hypertension 12/14/2019   Visit for suture removal 12/14/2019   HSV-2 (herpes simplex virus 2) infection    Sleep disorder, shift work     PCP: [name], MD  REFERRING PROVIDER: [name], MD  REFERRING DIAG: M54.31 (ICD-10-CM) - Sciatica of right side  THERAPY DIAG:  Other low back pain  Pain in right hip  ONSET DATE: subacute on chronic  SUBJECTIVE:                                                                                                                                                                                           SUBJECTIVE STATEMENT: Pt states everything but the front of the hip feels good  today. He feels mild irritation into the anterior of the R hip. Back and postero-lateral hip pain is gone now.   PERTINENT HISTORY:  Recent move from mobile job to more seated  PAIN:  Are you having pain? No: NPRS scale: 0/10 Pain location: Rt lower back & hip Pain description: it just hurts Aggravating factors: unknown Relieving factors: pressure and heat   PRECAUTIONS: None  WEIGHT BEARING RESTRICTIONS No  FALLS:  Has patient fallen in last 6 months? 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 Minimal grade 1 retrolisthesis of L4-5 is noted secondary to mild degenerative disc disease at this level. Mild degenerative disc disease is also noted at L5-S1.  PATIENT SURVEYS:  FOTO 48  TODAY'S TREATMENT  5/22   Keiser Upright bike seat J, 5 min warm up  Prone/quad hip flexor stretch 30s 3x Seated fig 4 30s 2x Hip swinging ML and AP 20x each Banded hip flexor supine 90/90 march GTB 3x10 Bridge on weight bench figure 43x10 Hip ABD side plank knees bent 10x each side  5/15  Manual:  STM R hip flexors  Prone/quad hip flexor stretch 30s 3x Child's  pose 10s 10x Paloff press Blue TB 2x10 each way Sidestepping with blue TB at knees 60f x2 Standing hip flexor stretch 30s 2x  5/8  Manual:  STM R hip rotators, R QL; L2-5 CPA and UPA bilat grade III  Standing hip flexor stretch 30s 3x STS with band at knees blue 2x10 RDL 15lb kb 2x10 Sidestepping with blue TB at knees 332fx2   EVAL Hesch- self correction for Rt post inom x2 rounds Figure 4 stretch Hooklying transv abd engagement Hooklying TrA+ add, + UE flexion- in hooklying & tabletop MANUAL: STM Rt hip in Lt SL   PATIENT EDUCATION:  Education details: Anatomy of condition, POC, HEP, exercise form/rationale  Person educated: Patient Education method: Explanation, Demonstration, Tactile cues, Verbal cues, and Handouts Education comprehension: verbalized understanding, returned demonstration, verbal cues required, tactile cues required, and needs further education   HOME EXERCISE PROGRAM: Access Code: [email or web address]: [email or web address] Date: 03/25/2022 Prepared by: AlDaleen BoExercises - Cat Cow  - 1 x daily - 7 x weekly - 1 sets - 10 reps - Prone Quad Stretch with Towel Roll and Strap  - 1 x daily - 7 x weekly - 3 sets - 10 reps - Sit to Stand with Resistance Around Legs  - 1 x daily - 3 x weekly - 3 sets - 10 reps - Side Stepping with Resistance at Thighs  - 1 x daily - 3 x weekly - 1 sets - 3 reps - Half Deadlift with Kettlebell  - 1 x daily - 3 x weekly - 3 sets - 10 reps - Standing Anti-Rotation Press with Anchored Resistance  - 1 x daily - 3 x weekly - 3 sets - 10 reps  ASSESSMENT:  CLINICAL IMPRESSION: Pt able to continue with progression of strength at today's session but with tetany and quick onset of muscle fatigue with more intense lumboplevic exercise, especially side planking. Pt required VC and TC for level pelvis and ab brace throughout session. Pt able  to continue with hip extension strength today with greater ROM. Pt continues to have R  hip tightness but it is no longer painful during exercise or ADL. SL activity did not recreate pain but pt did have more R sided instability. Plan to continue with unilateral hip strength and stability as tolerated, potentially trial SL RDL. Pt would benefit from continued skilled therapy in order to reach goals and maximize functional lumbopelvic strength and ROM for full return to PLOF.      OBJECTIVE IMPAIRMENTS decreased activity tolerance, increased muscle spasms, improper body mechanics, postural dysfunction, and pain.   ACTIVITY LIMITATIONS driving and occupation.   PERSONAL FACTORS Time since onset of injury/illness/exacerbation are also affecting patient's functional outcome.    REHAB POTENTIAL: Good  CLINICAL DECISION MAKING: Stable/uncomplicated  EVALUATION COMPLEXITY: Low   GOALS: Goals reviewed with patient? Yes  SHORT TERM GOALS: Target date: 04/15/2022  Able to utilize self correction for level pelvis Baseline: Goal status: INITIAL  2.  Able to demo  proper core contraction while breathing and keeping cervical muscles relaxed Baseline:  Goal status: INITIAL    LONG TERM GOALS: Target date: 05/13/2022  Gross hip strength within 10% of stated normative values in dynamometry for stability to pelvis Baseline: flexion: 41 lb, ABD 70lb Goal status: INITIAL  2.  Pt will meet FOTO goal Baseline:  Goal status: INITIAL  3.  Proper form demonstrated in strengthening activities for long term gym program Baseline:  Goal status: INITIAL  4.  Able to return to mountain biking Baseline:  Goal status: INITIAL  5.  Able to complete shift for work with minimal to no disruption from pain Baseline:  Goal status: INITIAL     PLAN: PT FREQUENCY: 1x/week- due to work schedule  PT DURATION: 6 weeks  PLANNED INTERVENTIONS: Therapeutic exercises, Therapeutic activity, Neuromuscular re-education, Balance training, Gait training, Patient/Family education, Joint  mobilization, Stair training, Aquatic Therapy, Dry Needling, Electrical stimulation, Spinal mobilization, Cryotherapy, Moist heat, Taping, and Manual therapy.  PLAN FOR NEXT SESSION: SL stability progression, runner's step up, SL/staggered RDL, standing fire hydrant  Daleen Bo PT, DPT 04/01/22 10:57 AM

## 2022-04-15 ENCOUNTER — Ambulatory Visit (HOSPITAL_BASED_OUTPATIENT_CLINIC_OR_DEPARTMENT_OTHER): Payer: 59 | Attending: Family Medicine | Admitting: Physical Therapy

## 2022-04-15 ENCOUNTER — Encounter (HOSPITAL_BASED_OUTPATIENT_CLINIC_OR_DEPARTMENT_OTHER): Payer: Self-pay | Admitting: Physical Therapy

## 2022-04-15 DIAGNOSIS — M5459 Other low back pain: Secondary | ICD-10-CM | POA: Insufficient documentation

## 2022-04-15 DIAGNOSIS — M25551 Pain in right hip: Secondary | ICD-10-CM | POA: Insufficient documentation

## 2022-04-15 NOTE — Therapy (Signed)
OUTPATIENT PHYSICAL THERAPY TREATMENT   Patient Name: Ethan Knox MRN: [id number] DOB:[birth year], 51 y.o., male Today's Date: 04/15/2022   PT End of Session - 04/15/22 1018     Visit Number 5    Number of Visits 7    Date for PT Re-Evaluation 04/19/22    Authorization Type UHC    PT Start Time 1017    PT Stop Time 1057    PT Time Calculation (min) 40 min    Activity Tolerance Patient tolerated treatment well    Behavior During Therapy [facility] for tasks assessed/performed                Past Medical History:  Diagnosis Date   Depression    HSV-2 (herpes simplex virus 2) infection    Hypertension    Kidney stone    Sleep disorder, shift work    Past Surgical History:  Procedure Laterality Date   BIOPSY  06/08/2020   Procedure: BIOPSY;  Surgeon: Milus Banister, MD;  Location: [facility];  Service: Endoscopy;;   ESOPHAGOGASTRODUODENOSCOPY (EGD) WITH PROPOFOL N/A 06/08/2020   Procedure: ESOPHAGOGASTRODUODENOSCOPY (EGD) WITH PROPOFOL;  Surgeon: Milus Banister, MD;  Location: [facility];  Service: Endoscopy;  Laterality: N/A;   ESOPHAGOGASTRODUODENOSCOPY (EGD) WITH PROPOFOL N/A 08/30/2021   Procedure: ESOPHAGOGASTRODUODENOSCOPY (EGD) WITH PROPOFOL;  Surgeon: Milus Banister, MD;  Location: [facility];  Service: Endoscopy;  Laterality: N/A;   EUS N/A 06/08/2020   Procedure: UPPER ENDOSCOPIC ULTRASOUND (EUS) RADIAL;  Surgeon: Milus Banister, MD;  Location: [facility];  Service: Endoscopy;  Laterality: N/A;   EUS N/A 08/30/2021   Procedure: UPPER ENDOSCOPIC ULTRASOUND (EUS) RADIAL;  Surgeon: Milus Banister, MD;  Location: [facility];  Service: Endoscopy;  Laterality: N/A;   VASECTOMY     Patient Active Problem List   Diagnosis Date Noted   Gastric nodule    Heart murmur 01/11/2020   Essential hypertension 12/14/2019   Visit for suture removal 12/14/2019   HSV-2 (herpes simplex virus 2) infection    Sleep disorder, shift work     PCP: [name], MD  REFERRING PROVIDER: [name], MD  REFERRING DIAG: M54.31 (ICD-10-CM) - Sciatica of right side  THERAPY DIAG:  Other low back pain  Pain in right hip  ONSET DATE: subacute on chronic  SUBJECTIVE:                                                                                                                                                                                           SUBJECTIVE STATEMENT: No pain right now.   PERTINENT HISTORY:  Recent move  from mobile job to more seated  PAIN:  Are you having pain? No: NPRS scale: 0/10 Pain location: Rt lower back & hip Pain description: it just hurts Aggravating factors: unknown Relieving factors: pressure and heat   PRECAUTIONS: None  WEIGHT BEARING RESTRICTIONS No  FALLS:  Has patient fallen in last 6 months? 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 Minimal grade 1 retrolisthesis of L4-5 is noted secondary to mild degenerative disc disease at this level. Mild degenerative disc disease is also noted at L5-S1.  PATIENT SURVEYS:  FOTO 67  MMT Right 04/15/2022 Left 04/15/2022  Hip flexion  58.8  73.5  Hip extension      Hip abduction 60.6  53.4   Hip adduction      Hip internal rotation      Hip external rotation      Knee flexion      Knee extension      Ankle dorsiflexion      Ankle plantarflexion      Ankle inversion      Ankle eversion       (Blank rows = not tested)   TODAY'S TREATMENT  6/5:  Dynamometry testing Scifit bike L4 5 min Side plank + hip abduction Single leg diver Plank with alt knee bends & hip ext Side plank + hip abd Wall  squat with tband  5/22   Keiser Upright bike seat J, 5 min warm up  Prone/quad hip flexor stretch 30s 3x Seated fig 4 30s 2x Hip swinging ML and AP 20x each Banded hip flexor supine 90/90 march GTB 3x10 Bridge on weight bench figure 43x10 Hip ABD side plank knees bent 10x each side  5/15  Manual:  STM R hip flexors  Prone/quad hip flexor stretch 30s 3x Child's pose 10s 10x Paloff press Blue TB 2x10 each way Sidestepping with blue TB at knees 71f x2 Standing hip flexor stretch 30s 2x     PATIENT EDUCATION:  Education details: Anatomy of condition, POC, HEP, exercise form/rationale  Person educated: Patient Education method: Explanation, Demonstration, Tactile cues, Verbal cues, and Handouts Education comprehension: verbalized understanding, returned demonstration, verbal cues required, tactile cues required, and needs further education   HOME EXERCISE PROGRAM: Access Code: [email or web address]: [email or web address]   ASSESSMENT:  CLINICAL IMPRESSION: Lacking hip abd strength for normative values- notable instability in CKC on Lt hip.    OBJECTIVE IMPAIRMENTS decreased activity tolerance, increased muscle spasms, improper body mechanics, postural dysfunction, and pain.   ACTIVITY LIMITATIONS driving and occupation.   PERSONAL FACTORS Time since onset of injury/illness/exacerbation are also affecting patient's functional outcome.    REHAB POTENTIAL: Good  CLINICAL DECISION MAKING: Stable/uncomplicated  EVALUATION COMPLEXITY: Low   GOALS: Goals reviewed with patient? Yes  SHORT TERM GOALS: Target date: 04/29/2022  Able to utilize self correction for level pelvis Baseline: Goal status: achieved  2.  Able to demo  proper core contraction while breathing and keeping cervical muscles relaxed Baseline:  Goal status: achieved    LONG TERM GOALS: Target date: 05/27/2022  Gross hip strength within 10% of stated normative values in dynamometry for  stability to pelvis Baseline: flexion: 41 lb,  ABD 70lb Goal status: INITIAL  2.  Pt will meet FOTO goal Baseline:  Goal status: INITIAL  3.  Proper form demonstrated in strengthening activities for long term gym program Baseline:  Goal status: INITIAL  4.  Able to return to mountain biking Baseline:  Goal status: INITIAL  5.  Able to complete shift for work with minimal to no disruption from pain Baseline:  Goal status: INITIAL     PLAN: PT FREQUENCY: 1x/week- due to work schedule  PT DURATION: 6 weeks  PLANNED INTERVENTIONS: Therapeutic exercises, Therapeutic activity, Neuromuscular re-education, Balance training, Gait training, Patient/Family education, Joint mobilization, Stair training, Aquatic Therapy, Dry Needling, Electrical stimulation, Spinal mobilization, Cryotherapy, Moist heat, Taping, and Manual therapy.  PLAN FOR NEXT SESSION: SL stability progression, runner's step up, SL/staggered RDL, standing fire hydrant  Carlei Huang C. Zareya Tuckett PT, DPT 04/15/22 11:58 AM

## 2022-04-22 ENCOUNTER — Ambulatory Visit (HOSPITAL_BASED_OUTPATIENT_CLINIC_OR_DEPARTMENT_OTHER): Payer: 59 | Admitting: Physical Therapy

## 2022-04-22 ENCOUNTER — Encounter (HOSPITAL_BASED_OUTPATIENT_CLINIC_OR_DEPARTMENT_OTHER): Payer: Self-pay | Admitting: Physical Therapy

## 2022-04-22 DIAGNOSIS — M5459 Other low back pain: Secondary | ICD-10-CM

## 2022-04-22 DIAGNOSIS — M25551 Pain in right hip: Secondary | ICD-10-CM

## 2022-04-22 NOTE — Therapy (Signed)
OUTPATIENT PHYSICAL THERAPY TREATMENT   Patient Name: Ethan Knox MRN: [id number] DOB:Apr 27, 1971, 51 y.o., male Today's Date: 04/22/2022   PT End of Session - 04/22/22 1018     Visit Number 6    Number of Visits 7    Date for PT Re-Evaluation 04/19/22    Authorization Type UHC    PT Start Time 3335    PT Stop Time 1055    PT Time Calculation (min) 40 min    Activity Tolerance Patient tolerated treatment well    Behavior During Therapy [facility] for tasks assessed/performed                Past Medical History:  Diagnosis Date   Depression    HSV-2 (herpes simplex virus 2) infection    Hypertension    Kidney stone    Sleep disorder, shift work    Past Surgical History:  Procedure Laterality Date   BIOPSY  06/08/2020   Procedure: BIOPSY;  Surgeon: Milus Banister, MD;  Location: [facility];  Service: Endoscopy;;   ESOPHAGOGASTRODUODENOSCOPY (EGD) WITH PROPOFOL N/A 06/08/2020   Procedure: ESOPHAGOGASTRODUODENOSCOPY (EGD) WITH PROPOFOL;  Surgeon: Milus Banister, MD;  Location: [facility];  Service: Endoscopy;  Laterality: N/A;   ESOPHAGOGASTRODUODENOSCOPY (EGD) WITH PROPOFOL N/A 08/30/2021   Procedure: ESOPHAGOGASTRODUODENOSCOPY (EGD) WITH PROPOFOL;  Surgeon: Milus Banister, MD;  Location: [facility];  Service: Endoscopy;  Laterality: N/A;   EUS N/A 06/08/2020   Procedure: UPPER ENDOSCOPIC ULTRASOUND (EUS) RADIAL;  Surgeon: Milus Banister, MD;  Location: [facility];  Service: Endoscopy;  Laterality: N/A;   EUS N/A 08/30/2021   Procedure: UPPER ENDOSCOPIC ULTRASOUND (EUS) RADIAL;  Surgeon: Milus Banister, MD;  Location: [facility];  Service: Endoscopy;  Laterality: N/A;   VASECTOMY     Patient Active Problem List   Diagnosis Date Noted   Gastric nodule    Heart murmur 01/11/2020   Essential hypertension 12/14/2019   Visit for suture removal 12/14/2019   HSV-2 (herpes simplex virus 2) infection    Sleep disorder, shift work     PCP: [name], MD  REFERRING PROVIDER: [name], MD  REFERRING DIAG: M54.31 (ICD-10-CM) - Sciatica of right side  THERAPY DIAG:  Other low back pain  Pain in right hip  ONSET DATE: subacute on chronic  SUBJECTIVE:                                                                                                                                                                                           SUBJECTIVE STATEMENT: Pt states he was sore after last session but no increase  in pain.   PERTINENT HISTORY:  Recent move from mobile job to more seated  PAIN:  Are you having pain? No: NPRS scale: 0/10 Pain location: Rt lower back & hip Pain description: it just hurts Aggravating factors: unknown Relieving factors: pressure and heat   PRECAUTIONS: None  WEIGHT BEARING RESTRICTIONS No  FALLS:  Has patient fallen in last 6 months? 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 Minimal grade 1 retrolisthesis of L4-5 is noted secondary to mild degenerative disc disease at this level. Mild degenerative disc disease is also noted at L5-S1.  PATIENT SURVEYS:  FOTO 48  MMT Right 04/15/2022 Left 04/15/2022  Hip flexion  58.8  73.5  Hip extension      Hip abduction 60.6  53.4    (Blank rows = not tested)   TODAY'S TREATMENT   6/12   Upright bike L4 5 min  Goblet squat with pause at half and quarter 3s hold 2x5 Single leg diver 2x10 (foam roller on opp hand and foot) Runner's step up 8" box 3x10 Side plank + hip abd YTB at knees 2x10 Standing fire hydrant YTB at ankles 2x10 (requires foot touch between each rep)  Forearm plank  with hip ext alternating 10x each   6/5:  Dynamometry testing Scifit bike L4 5 min Side plank + hip abduction Single leg diver Plank with alt knee bends & hip ext Side plank + hip abd Wall squat with tband  5/22   Keiser Upright bike seat J, 5 min warm up  Prone/quad hip flexor stretch 30s 3x Seated fig 4 30s 2x Hip swinging ML and AP 20x each Banded hip flexor supine 90/90 march GTB 3x10 Bridge on weight bench figure 43x10 Hip ABD side plank knees bent 10x each side   PATIENT EDUCATION:  Education details: Geophysicist/field seismologist of condition, POC, HEP, exercise form/rationale  Person educated: Patient Education method: Consulting civil engineer, Demonstration, Tactile cues, Verbal cues, and Handouts Education comprehension: verbalized understanding, returned demonstration, verbal cues required, tactile cues required, and needs further education   HOME EXERCISE PROGRAM: Access Code: [email or web address] URL: [email or web address]   ASSESSMENT:  CLINICAL IMPRESSION: Pt able to continue with stability progression of bilat LE today without increased pain. Pt does have expected motor control deficits with R LE stability exercise but is improved from previous session. Requires external stability on contralateral side in order to maintain level pelvis. Pt squatting modified to focus on stability and gaining motor control at varius depths. Pt without pain during session and without complaints during HEP. Plan to D/C at next session. FOTO not taken due to potential D/C next session.   OBJECTIVE IMPAIRMENTS decreased activity tolerance, increased muscle spasms, improper body mechanics, postural dysfunction, and pain.   ACTIVITY LIMITATIONS driving and occupation.   PERSONAL FACTORS Time since onset of injury/illness/exacerbation are also affecting patient's functional outcome.    REHAB POTENTIAL: Good  CLINICAL DECISION MAKING: Stable/uncomplicated  EVALUATION COMPLEXITY: Low   GOALS: Goals  reviewed with patient? Yes  SHORT TERM GOALS: Target date: 04/29/2022  Able to utilize self correction for level pelvis Baseline: Goal status: achieved  2.  Able to demo  proper core contraction while  breathing and keeping cervical muscles relaxed Baseline:  Goal status: achieved    LONG TERM GOALS: Target date: 05/27/2022  Gross hip strength within 10% of stated normative values in dynamometry for stability to pelvis Baseline: flexion: 41 lb, ABD 70lb Goal status: INITIAL  2.  Pt will meet FOTO goal Baseline:  Goal status: INITIAL  3.  Proper form demonstrated in strengthening activities for long term gym program Baseline:  Goal status: INITIAL  4.  Able to return to mountain biking Baseline:  Goal status: INITIAL  5.  Able to complete shift for work with minimal to no disruption from pain Baseline:  Goal status: INITIAL     PLAN: PT FREQUENCY: 1x/week- due to work schedule  PT DURATION: 6 weeks  PLANNED INTERVENTIONS: Therapeutic exercises, Therapeutic activity, Neuromuscular re-education, Balance training, Gait training, Patient/Family education, Joint mobilization, Stair training, Aquatic Therapy, Dry Needling, Electrical stimulation, Spinal mobilization, Cryotherapy, Moist heat, Taping, and Manual therapy.  PLAN FOR NEXT SESSION:  Plan for D/C if no issues  Daleen Bo PT, DPT 04/22/22 10:58 AM

## 2022-04-29 ENCOUNTER — Ambulatory Visit (HOSPITAL_BASED_OUTPATIENT_CLINIC_OR_DEPARTMENT_OTHER): Payer: 59 | Admitting: Physical Therapy

## 2022-04-29 ENCOUNTER — Encounter (HOSPITAL_BASED_OUTPATIENT_CLINIC_OR_DEPARTMENT_OTHER): Payer: Self-pay | Admitting: Physical Therapy

## 2022-04-29 DIAGNOSIS — M25551 Pain in right hip: Secondary | ICD-10-CM

## 2022-04-29 DIAGNOSIS — M5459 Other low back pain: Secondary | ICD-10-CM

## 2022-04-29 NOTE — Therapy (Signed)
OUTPATIENT PHYSICAL THERAPY TREATMENT   Patient Name: Ethan Knox MRN: [id number] DOB:[birth year], 51 y.o., male Today's Date: 04/29/2022   PT End of Session - 04/29/22 1020     Visit Number 7    Number of Visits 7    Authorization Type UHC    PT Start Time 1017    PT Stop Time 1053    PT Time Calculation (min) 36 min    Activity Tolerance Patient tolerated treatment well    Behavior During Therapy WFL for tasks assessed/performed                 Past Medical History:  Diagnosis Date   Depression    HSV-2 (herpes simplex virus 2) infection    Hypertension    Kidney stone    Sleep disorder, shift work    Past Surgical History:  Procedure Laterality Date   BIOPSY  06/08/2020   Procedure: BIOPSY;  Surgeon: Milus Banister, MD;  Location: [facility];  Service: Endoscopy;;   ESOPHAGOGASTRODUODENOSCOPY (EGD) WITH PROPOFOL N/A 06/08/2020   Procedure: ESOPHAGOGASTRODUODENOSCOPY (EGD) WITH PROPOFOL;  Surgeon: Milus Banister, MD;  Location: [facility];  Service: Endoscopy;  Laterality: N/A;   ESOPHAGOGASTRODUODENOSCOPY (EGD) WITH PROPOFOL N/A 08/30/2021   Procedure: ESOPHAGOGASTRODUODENOSCOPY (EGD) WITH PROPOFOL;  Surgeon: Milus Banister, MD;  Location: [facility];  Service: Endoscopy;  Laterality: N/A;   EUS N/A 06/08/2020   Procedure: UPPER ENDOSCOPIC ULTRASOUND (EUS) RADIAL;  Surgeon: Milus Banister, MD;  Location: [facility];  Service: Endoscopy;  Laterality: N/A;   EUS N/A 08/30/2021   Procedure: UPPER ENDOSCOPIC ULTRASOUND (EUS) RADIAL;  Surgeon: Milus Banister, MD;  Location: [facility];  Service: Endoscopy;  Laterality: N/A;   VASECTOMY     Patient Active Problem List   Diagnosis Date Noted   Gastric nodule    Heart murmur 01/11/2020   Essential hypertension 12/14/2019   Visit for suture removal 12/14/2019   HSV-2 (herpes simplex virus 2) infection    Sleep disorder, shift work     PCP: [name], MD  REFERRING PROVIDER:  [name], MD  REFERRING DIAG: M54.31 (ICD-10-CM) - Sciatica of right side  THERAPY DIAG:  Other low back pain  Pain in right hip  ONSET DATE: subacute on chronic  SUBJECTIVE:                                                                                                                                                                                           SUBJECTIVE STATEMENT: Has been feeling good, no complaints or concerns.   PERTINENT HISTORY:  Recent move from mobile job  to more seated  PAIN:  Are you having pain? No: NPRS scale: 0/10 Pain location: Rt lower back & hip Pain description: it just hurts Aggravating factors: unknown Relieving factors: pressure and heat   PRECAUTIONS: None  WEIGHT BEARING RESTRICTIONS No  FALLS:  Has patient fallen in last 6 months? 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 Minimal grade 1 retrolisthesis of L4-5 is noted secondary to mild degenerative disc disease at this level. Mild degenerative disc disease is also noted at L5-S1.  PATIENT SURVEYS:  FOTO 48 6/19: 94  MMT Right 04/15/2022 Left 04/15/2022 Rt/Lt 6/19  Hip flexion  58.8  73.5 64.8/67.1  Hip extension       Hip abduction 60.6  53.4  78.9/68.4   (Blank rows = not tested)   TODAY'S TREATMENT   6/19:  Supine HSS & ITB stretch Bicycles prog to dead bug Side plank with clam, hip abd Forearm plank- knee taps, hip ext Standing diver- with and without foam roll Standing fire hydrant- UE support with foam roll Scapular retraction   6/12   Upright bike L4 5 min  Goblet squat with pause at half and quarter 3s  hold 2x5 Single leg diver 2x10 (foam roller on opp hand and foot) Runner's step up 8" box 3x10 Side plank + hip abd YTB at knees 2x10 Standing fire hydrant YTB at ankles 2x10 (requires foot touch between each rep)  Forearm plank with hip ext alternating 10x each   6/5:  Dynamometry testing Scifit bike L4 5 min Side plank + hip abduction Single leg diver Plank with alt knee bends & hip ext Side plank + hip abd Wall squat with tband  5/22   Keiser Upright bike seat J, 5 min warm up  Prone/quad hip flexor stretch 30s 3x Seated fig 4 30s 2x Hip swinging ML and AP 20x each Banded hip flexor supine 90/90 march GTB 3x10 Bridge on weight bench figure 43x10 Hip ABD side plank knees bent 10x each side   PATIENT EDUCATION:  Education details: Geophysicist/field seismologist of condition, POC, HEP, exercise form/rationale  Person educated: Patient Education method: Consulting civil engineer, Demonstration, Tactile cues, Verbal cues, and Handouts Education comprehension: verbalized understanding, returned demonstration, verbal cues required, tactile cues required, and needs further education   HOME EXERCISE PROGRAM: Access Code: [email or web address] URL: [email or web address]   ASSESSMENT:  CLINICAL IMPRESSION: Pt has met all of his goals at this time and is prepared for d/c to independent program. Encouraged him to build from his HEP with awareness of proper activation patterns and to reach out with any further questions.    OBJECTIVE IMPAIRMENTS decreased activity tolerance, increased muscle spasms, improper body mechanics, postural dysfunction, and pain.   ACTIVITY LIMITATIONS driving and occupation.   PERSONAL FACTORS Time since onset of injury/illness/exacerbation are also affecting patient's functional outcome.    REHAB POTENTIAL: Good  CLINICAL DECISION MAKING: Stable/uncomplicated  EVALUATION COMPLEXITY: Low   GOALS: Goals reviewed with patient? Yes  SHORT TERM GOALS: Target date:  04/29/2022  Able to utilize self correction for level pelvis Baseline: Goal status: achieved  2.  Able to demo  proper core contraction while breathing and keeping cervical muscles  relaxed Baseline:  Goal status: achieved    LONG TERM GOALS: Target date: 05/27/2022  Gross hip strength within 10% of stated normative values in dynamometry for stability to pelvis Baseline:  Goal status: achieved  2.  Pt will meet FOTO goal Baseline:  Goal status: achieved  3.  Proper form demonstrated in strengthening activities for long term gym program Baseline:  Goal status: achieved  4.  Able to return to mountain biking Baseline:  Goal status: achieved  5.  Able to complete shift for work with minimal to no disruption from pain Baseline:  Goal status: achieved     PLAN: PT FREQUENCY: 1x/week- due to work schedule  PT DURATION: 6 weeks  PLANNED INTERVENTIONS: Therapeutic exercises, Therapeutic activity, Neuromuscular re-education, Balance training, Gait training, Patient/Family education, Joint mobilization, Stair training, Aquatic Therapy, Dry Needling, Electrical stimulation, Spinal mobilization, Cryotherapy, Moist heat, Taping, and Manual therapy.  PHYSICAL THERAPY DISCHARGE SUMMARY  Visits from Start of Care: 7  Current functional level related to goals / functional outcomes: See above   Remaining deficits: See above   Education / Equipment: Anatomy of condition, POC, HEP, exercise form/rationale    Patient agrees to discharge. Patient goals were met. Patient is being discharged due to meeting the stated rehab goals.   Joas Motton C. Karnisha Lefebre PT, DPT 04/29/22 11:21 AM

## 2022-05-07 ENCOUNTER — Encounter: Payer: Self-pay | Admitting: Cardiology

## 2022-06-02 IMAGING — DX DG LUMBAR SPINE COMPLETE 4+V
5 series · 5 of 5 positions shown · non-contrast
Comparison: None.

CLINICAL DATA: Recurrent right-sided low back pain with sciatica.

EXAM:
LUMBAR SPINE - COMPLETE 4+ VIEW

[l-spine ap]
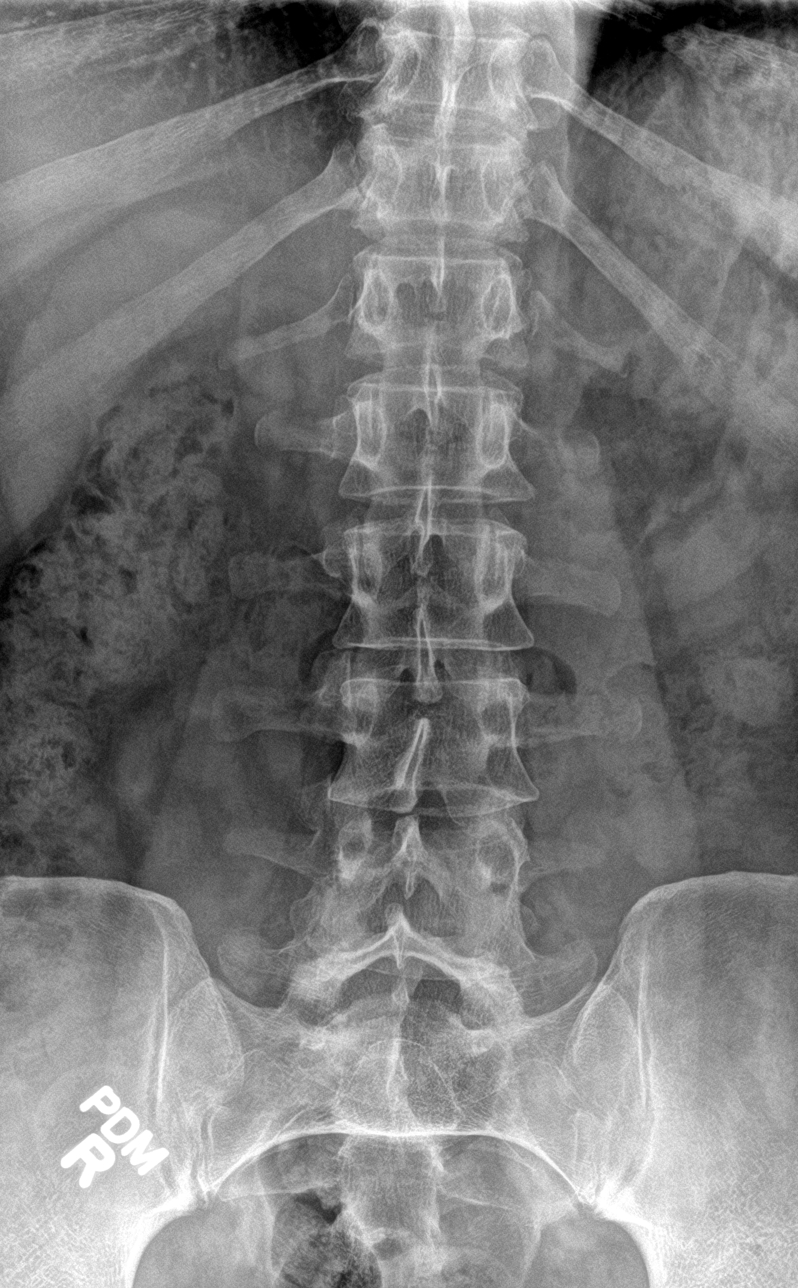

[l-spine obl (1 of 2)]
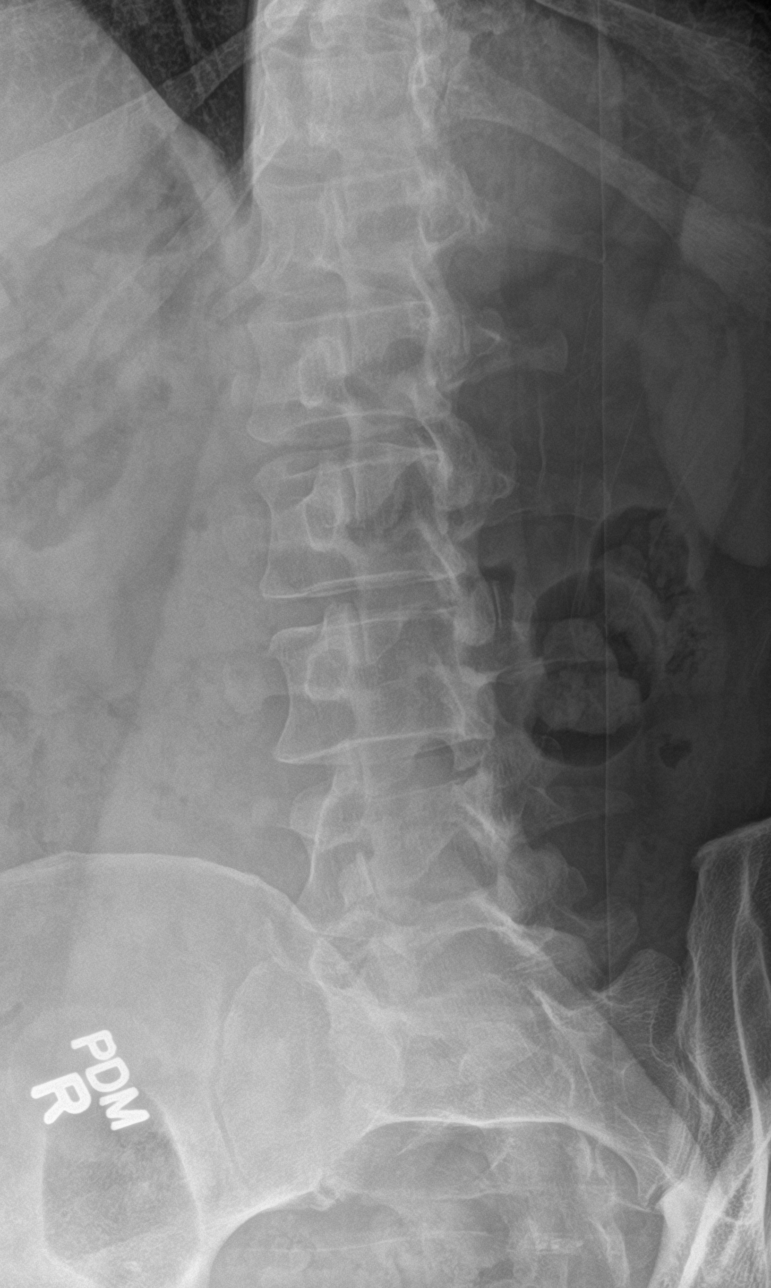

[l-spine obl (2 of 2)]
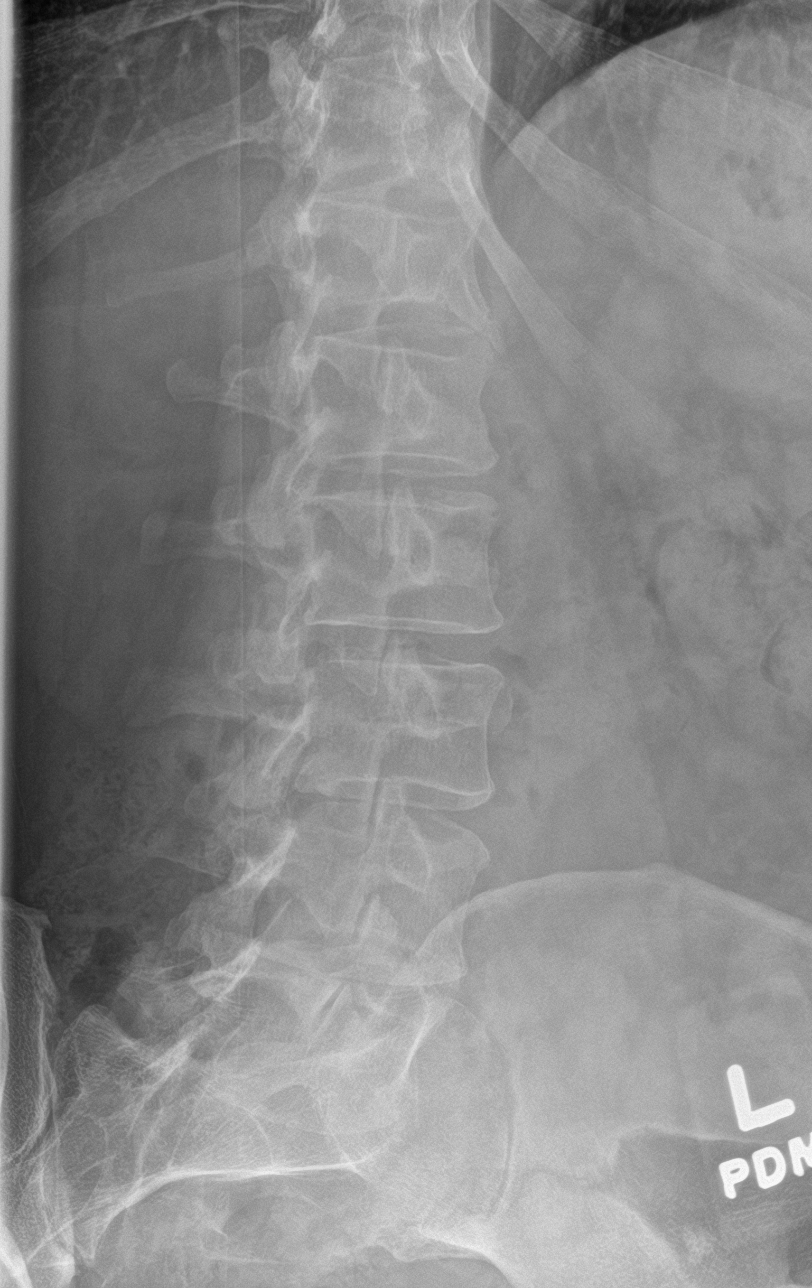

[l-spine lat]
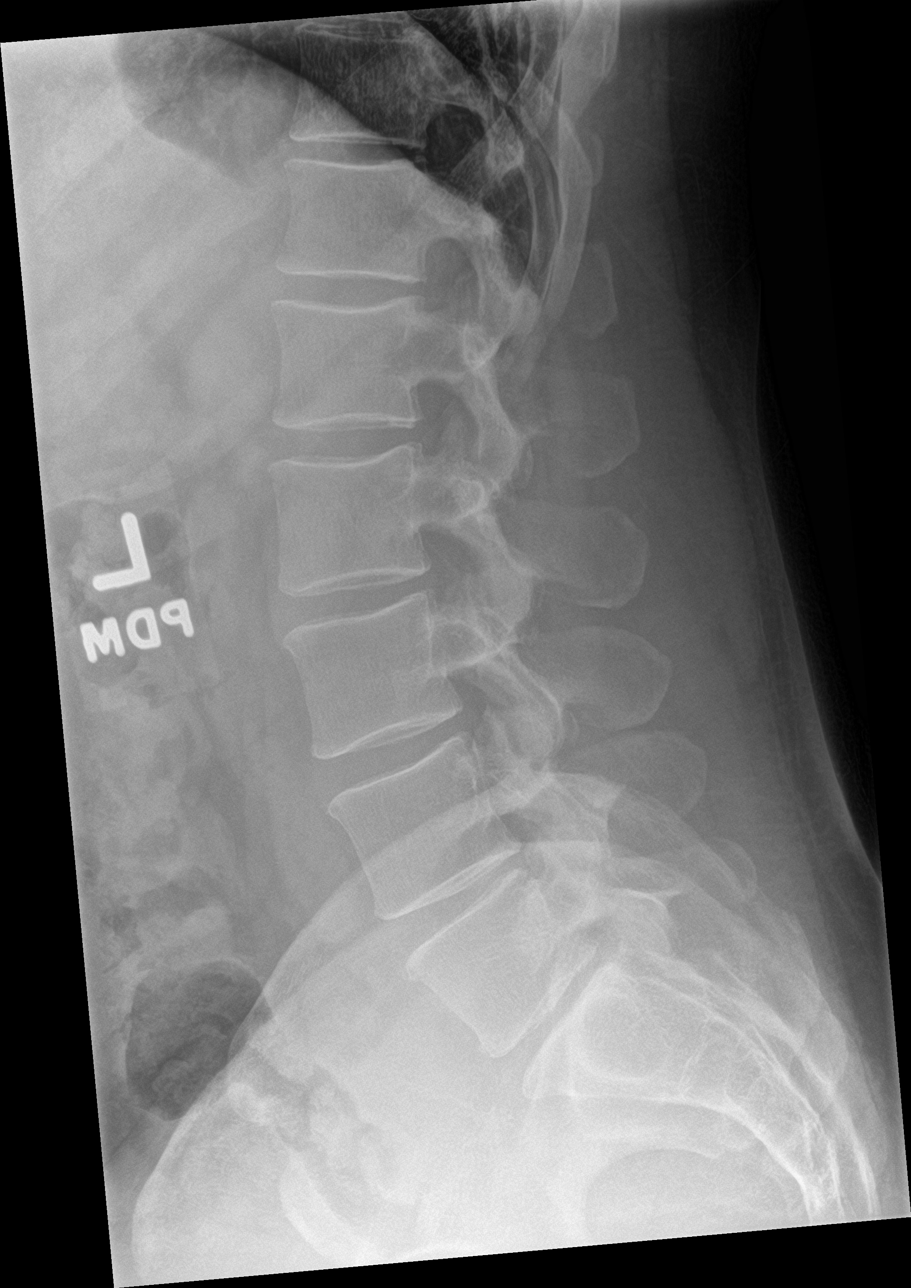

[l-spine spot]
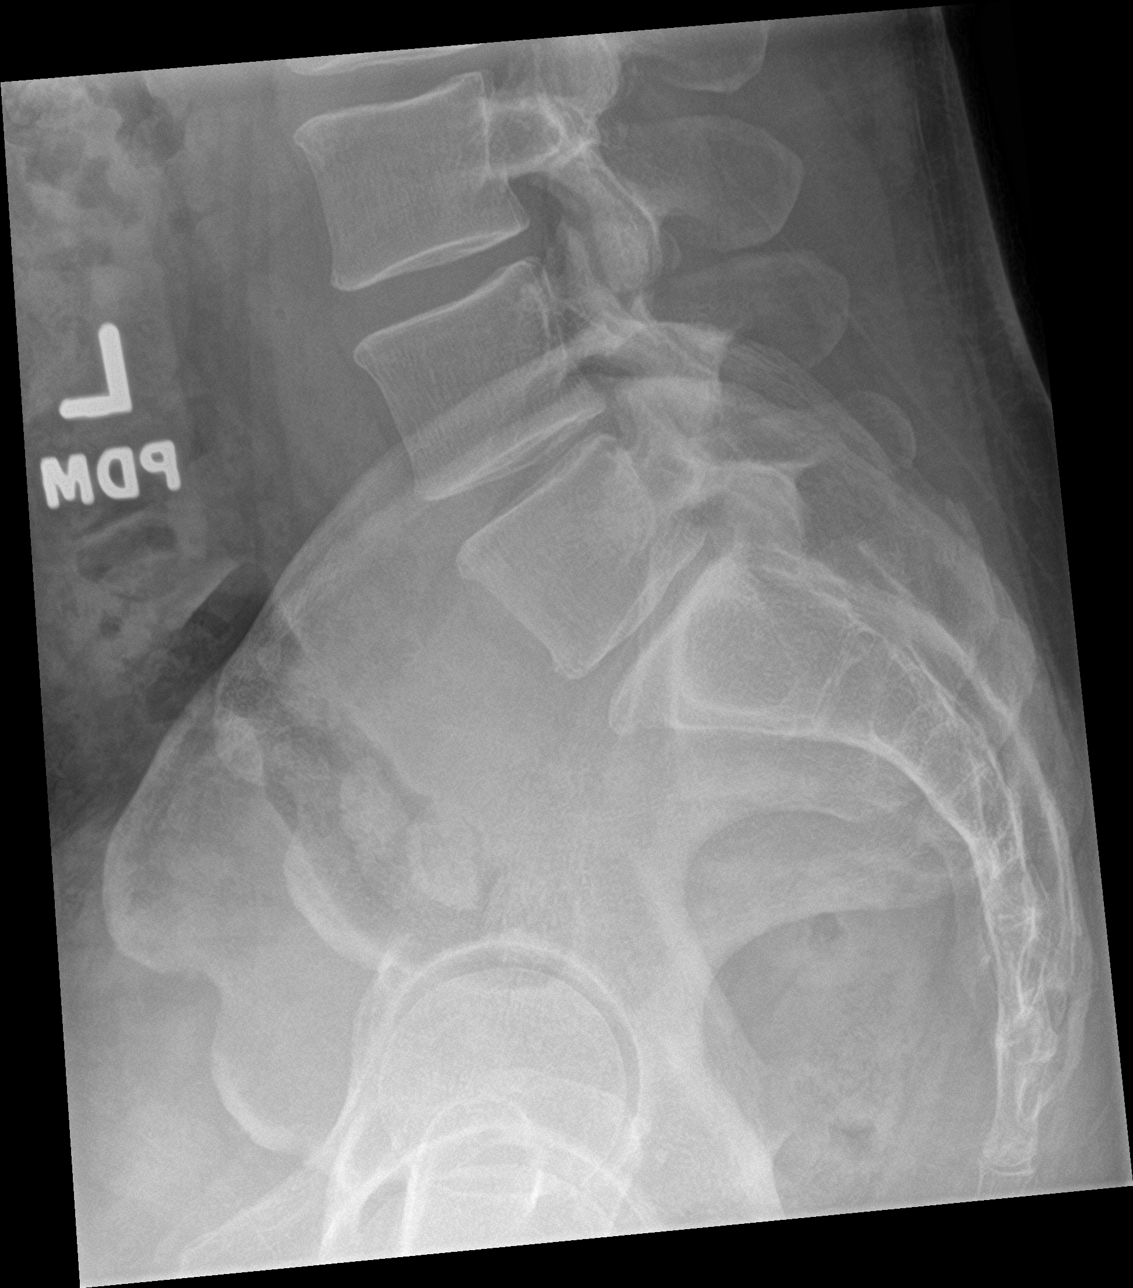

[5 of 5 positions shown; findings below may reference images not displayed]

FINDINGS: There is no evidence of lumbar spine fracture. Minimal grade 1
retrolisthesis of L4-5 is noted secondary to mild degenerative disc
disease at this level. Mild degenerative disc disease is also noted
at L5-S1.
IMPRESSION: Mild multilevel degenerative disc disease. No acute abnormality
seen.

## 2022-06-17 ENCOUNTER — Encounter: Payer: Self-pay | Admitting: Family Medicine

## 2022-06-17 ENCOUNTER — Ambulatory Visit: Payer: 59 | Admitting: Family Medicine

## 2022-06-17 VITALS — BP 134/82 | HR 75 | Temp 97.8°F | Resp 20 | Ht 75.0 in | Wt 251.2 lb

## 2022-06-17 DIAGNOSIS — I10 Essential (primary) hypertension: Secondary | ICD-10-CM

## 2022-06-17 DIAGNOSIS — K219 Gastro-esophageal reflux disease without esophagitis: Secondary | ICD-10-CM

## 2022-06-17 DIAGNOSIS — F32A Depression, unspecified: Secondary | ICD-10-CM

## 2022-06-17 DIAGNOSIS — E785 Hyperlipidemia, unspecified: Secondary | ICD-10-CM | POA: Diagnosis not present

## 2022-06-17 DIAGNOSIS — M5431 Sciatica, right side: Secondary | ICD-10-CM | POA: Diagnosis not present

## 2022-06-17 MED ORDER — LISINOPRIL-HYDROCHLOROTHIAZIDE 20-12.5 MG PO TABS: 1.0000 | ORAL_TABLET | Freq: Every day | ORAL | 1 refills | Status: DC

## 2022-06-17 MED ORDER — DULOXETINE HCL 60 MG PO CPEP: 60.0000 mg | ORAL_CAPSULE | Freq: Every day | ORAL | 3 refills | Status: DC

## 2022-06-17 MED ORDER — PANTOPRAZOLE SODIUM 40 MG PO TBEC: 40.0000 mg | DELAYED_RELEASE_TABLET | Freq: Every day | ORAL | 3 refills | Status: DC

## 2022-06-17 NOTE — Patient Instructions (Addendum)
Keep a record of your blood pressures outside of the office and running higher let me know.  No med changes for now. Thanks for coming in today.   Managing Your Hypertension Hypertension, also called high blood pressure, is when the force of the blood pressing against the walls of the arteries is too strong. Arteries are blood vessels that carry blood from your heart throughout your body. Hypertension forces the heart to work harder to pump blood and may cause the arteries to become narrow or stiff. Understanding blood pressure readings A blood pressure reading includes a higher number over a lower number: The first, or top, number is called the systolic pressure. It is a measure of the pressure in your arteries as your heart beats. The second, or bottom number, is called the diastolic pressure. It is a measure of the pressure in your arteries as the heart relaxes. For most people, a normal blood pressure is below 120/80. Your personal target blood pressure may vary depending on your medical conditions, your age, and other factors. Blood pressure is classified into four stages. Based on your blood pressure reading, your health care provider may use the following stages to determine what type of treatment you need, if any. Systolic pressure and diastolic pressure are measured in a unit called millimeters of mercury (mmHg). Normal Systolic pressure: below 876. Diastolic pressure: below 80. Elevated Systolic pressure: 811-572. Diastolic pressure: below 80. Hypertension stage 1 Systolic pressure: 620-355. Diastolic pressure: 97-41. Hypertension stage 2 Systolic pressure: 638 or above. Diastolic pressure: 90 or above. How can this condition affect me? Managing your hypertension is very important. Over time, hypertension can damage the arteries and decrease blood flow to parts of the body, including the brain, heart, and kidneys. Having untreated or uncontrolled hypertension can lead to: A heart  attack. A stroke. A weakened blood vessel (aneurysm). Heart failure. Kidney damage. Eye damage. Memory and concentration problems. Vascular dementia. What actions can I take to manage this condition? Hypertension can be managed by making lifestyle changes and possibly by taking medicines. Your health care provider will help you make a plan to bring your blood pressure within a normal range. You may be referred for counseling on a healthy diet and physical activity. Nutrition  Eat a diet that is high in fiber and potassium, and low in salt (sodium), added sugar, and fat. An example eating plan is called the DASH diet. DASH stands for Dietary Approaches to Stop Hypertension. To eat this way: Eat plenty of fresh fruits and vegetables. Try to fill one-half of your plate at each meal with fruits and vegetables. Eat whole grains, such as whole-wheat pasta, brown rice, or whole-grain bread. Fill about one-fourth of your plate with whole grains. Eat low-fat dairy products. Avoid fatty cuts of meat, processed or cured meats, and poultry with skin. Fill about one-fourth of your plate with lean proteins such as fish, chicken without skin, beans, eggs, and tofu. Avoid pre-made and processed foods. These tend to be higher in sodium, added sugar, and fat. Reduce your daily sodium intake. Many people with hypertension should eat less than 1,500 mg of sodium a day. Lifestyle  Work with your health care provider to maintain a healthy body weight or to lose weight. Ask what an ideal weight is for you. Get at least 30 minutes of exercise that causes your heart to beat faster (aerobic exercise) most days of the week. Activities may include walking, swimming, or biking. Include exercise to strengthen your muscles (  resistance exercise), such as weight lifting, as part of your weekly exercise routine. Try to do these types of exercises for 30 minutes at least 3 days a week. Do not use any products that contain  nicotine or tobacco. These products include cigarettes, chewing tobacco, and vaping devices, such as e-cigarettes. If you need help quitting, ask your health care provider. Control any long-term (chronic) conditions you have, such as high cholesterol or diabetes. Identify your sources of stress and find ways to manage stress. This may include meditation, deep breathing, or making time for fun activities. Alcohol use Do not drink alcohol if: Your health care provider tells you not to drink. You are pregnant, may be pregnant, or are planning to become pregnant. If you drink alcohol: Limit how much you have to: 0-1 drink a day for women. 0-2 drinks a day for men. Know how much alcohol is in your drink. In the [place] one drink equals one 12 oz bottle of beer (355 mL), one 5 oz glass of wine (148 mL), or one 1 oz glass of hard liquor (44 mL). Medicines Your health care provider may prescribe medicine if lifestyle changes are not enough to get your blood pressure under control and if: Your systolic blood pressure is 130 or higher. Your diastolic blood pressure is 80 or higher. Take medicines only as told by your health care provider. Follow the directions carefully. Blood pressure medicines must be taken as told by your health care provider. The medicine does not work as well when you skip doses. Skipping doses also puts you at risk for problems. Monitoring Before you monitor your blood pressure: Do not smoke, drink caffeinated beverages, or exercise within 30 minutes before taking a measurement. Use the bathroom and empty your bladder (urinate). Sit quietly for at least 5 minutes before taking measurements. Monitor your blood pressure at home as told by your health care provider. To do this: Sit with your back straight and supported. Place your feet flat on the floor. Do not cross your legs. Support your arm on a flat surface, such as a table. Make sure your upper arm is at heart level. Each  time you measure, take two or three readings one minute apart and record the results. You may also need to have your blood pressure checked regularly by your health care provider. General information Talk with your health care provider about your diet, exercise habits, and other lifestyle factors that may be contributing to hypertension. Review all the medicines you take with your health care provider because there may be side effects or interactions. Keep all follow-up visits. Your health care provider can help you create and adjust your plan for managing your high blood pressure. Where to find more information National Heart, Lung, and Blood Institute: https://wilson-eaton.com/ American Heart Association: www.heart.org Contact a health care provider if: You think you are having a reaction to medicines you have taken. You have repeated (recurrent) headaches. You feel dizzy. You have swelling in your ankles. You have trouble with your vision. Get help right away if: You develop a severe headache or confusion. You have unusual weakness or numbness, or you feel faint. You have severe pain in your chest or abdomen. You vomit repeatedly. You have trouble breathing. These symptoms may be an emergency. Get help right away. Call 911. Do not wait to see if the symptoms will go away. Do not drive yourself to the hospital. Summary Hypertension is when the force of blood pumping through your arteries  is too strong. If this condition is not controlled, it may put you at risk for serious complications. Your personal target blood pressure may vary depending on your medical conditions, your age, and other factors. For most people, a normal blood pressure is less than 120/80. Hypertension is managed by lifestyle changes, medicines, or both. Lifestyle changes to help manage hypertension include losing weight, eating a healthy, low-sodium diet, exercising more, stopping smoking, and limiting alcohol. This  information is not intended to replace advice given to you by your health care provider. Make sure you discuss any questions you have with your health care provider. Document Revised: 07/12/2021 Document Reviewed: 07/12/2021 Elsevier Patient Education  [place].

## 2022-06-17 NOTE — Progress Notes (Signed)
Subjective:  Patient ID: Ethan Knox, male    DOB: May 12, 1971  Age: 51 y.o. MRN: [id number]  CC:  Chief Complaint  Patient presents with   Hypertension    HPI Ethan Knox presents for   Hypertension: Lisinopril HCTZ 20/12.5 mg daily. No new side effects.  Home readings:none BP Readings from Last 3 Encounters:  06/17/22 134/82  03/18/22 128/74  02/04/22 118/70   Lab Results  Component Value Date   CREATININE 0.95 12/31/2021   Wt Readings from Last 3 Encounters:  06/17/22 251 lb 3.2 oz (113.9 kg)  03/18/22 252 lb (114.3 kg)  02/04/22 257 lb (116.6 kg)  No current statin.  FH of heart disease, but no early CAD.  The 10-year ASCVD risk score (Arnett DK, et al., 2019) is: 10.6%   Values used to calculate the score:     Age: 10 years     Sex: Male     Is Non-Hispanic African American: No     Diabetic: No     Tobacco smoker: Yes     Systolic Blood Pressure: 846 mmHg     Is BP treated: Yes     HDL Cholesterol: 41.6 mg/dL     Total Cholesterol: 195 mg/dL     Right low back pain Last visit May 8.  Status post Depo-Medrol injection, Voltaren, methocarbamol then physical therapy.  Was improving with start of PT.  Option to meet with back specialist if not continue to improve with PT.  Doing much better, not needing meds typically.  Has HEP - few times per week. Stretches. Not limiting work.   GERD Protonix 40 mg daily - daily. Rare breakthrough symptoms with trigger foods only.   Depression: Cymbalta 60 mg daily.  Has Ambien if needed, 5 mg planned with intermittent use, shift work sleep issues if needed. Still doing well with physical activity. Trending weight down.  Rare use ambien, none recent - some trouble getting to sleep recently - increased stress recently.  Deneis need for counseling at this time.      06/17/2022   10:42 AM 02/04/2022   10:48 AM 12/20/2021    2:12 PM 08/23/2020   11:25 AM 02/18/2020    2:31 PM  Depression screen PHQ 2/9   Decreased Interest 0 1 1 0 0  Down, Depressed, Hopeless 0 0 1 0 0  PHQ - 2 Score 0 1 2 0 0  Altered sleeping '1 1 1    '$ Tired, decreased energy '1 1 1    '$ Change in appetite 0 0 0    Feeling bad or failure about yourself  0 0 0    Trouble concentrating 0 0 0    Moving slowly or fidgety/restless 0 0 0    Suicidal thoughts 0 0 0    PHQ-9 Score '2 3 4        '$ History Patient Active Problem List   Diagnosis Date Noted   Gastric nodule    Heart murmur 01/11/2020   Essential hypertension 12/14/2019   Visit for suture removal 12/14/2019   HSV-2 (herpes simplex virus 2) infection    Sleep disorder, shift work    Past Medical History:  Diagnosis Date   Depression    HSV-2 (herpes simplex virus 2) infection    Hypertension    Kidney stone    Sleep disorder, shift work    Past Surgical History:  Procedure Laterality Date   BIOPSY  06/08/2020   Procedure: BIOPSY;  Surgeon: Ardis Hughs,  Melene Plan, MD;  Location: [facility];  Service: Endoscopy;;   ESOPHAGOGASTRODUODENOSCOPY (EGD) WITH PROPOFOL N/A 06/08/2020   Procedure: ESOPHAGOGASTRODUODENOSCOPY (EGD) WITH PROPOFOL;  Surgeon: Milus Banister, MD;  Location: [facility];  Service: Endoscopy;  Laterality: N/A;   ESOPHAGOGASTRODUODENOSCOPY (EGD) WITH PROPOFOL N/A 08/30/2021   Procedure: ESOPHAGOGASTRODUODENOSCOPY (EGD) WITH PROPOFOL;  Surgeon: Milus Banister, MD;  Location: [facility];  Service: Endoscopy;  Laterality: N/A;   EUS N/A 06/08/2020   Procedure: UPPER ENDOSCOPIC ULTRASOUND (EUS) RADIAL;  Surgeon: Milus Banister, MD;  Location: [facility];  Service: Endoscopy;  Laterality: N/A;   EUS N/A 08/30/2021   Procedure: UPPER ENDOSCOPIC ULTRASOUND (EUS) RADIAL;  Surgeon: Milus Banister, MD;  Location: [facility];  Service: Endoscopy;  Laterality: N/A;   VASECTOMY     Allergies  Allergen Reactions   Prilosec [Omeprazole Magnesium] Rash   Prior to Admission medications   Medication Sig Start Date End Date Taking? Authorizing  Provider  cetirizine (ZYRTEC) 10 MG tablet Take 10 mg by mouth daily.   Yes [provider]  DULoxetine (CYMBALTA) 60 MG capsule Take 1 capsule (60 mg total) by mouth daily. 09/12/21  Yes Maximiano Coss, NP  fluticasone (FLONASE) 50 MCG/ACT nasal spray Place 1 spray into both nostrils daily as needed for allergies or rhinitis.   Yes [provider]  lisinopril-hydrochlorothiazide (ZESTORETIC) 20-12.5 MG tablet TAKE 1 TABLET BY MOUTH EVERY DAY 03/02/22  Yes [name], MD  methocarbamol (ROBAXIN) 500 MG tablet Take 1 tablet (500 mg total) by mouth 4 (four) times daily. 01/01/22  Yes Maximiano Coss, NP  pantoprazole (PROTONIX) 40 MG tablet Take 1 tablet (40 mg total) by mouth daily. 09/12/21  Yes Maximiano Coss, NP  valACYclovir (VALTREX) 500 MG tablet Take 1 tablet (500 mg total) by mouth daily. 09/12/21  Yes Maximiano Coss, NP  zolpidem (AMBIEN) 10 MG tablet Take 0.5 tablets (5 mg total) by mouth at bedtime as needed for sleep. 09/12/21  Yes Maximiano Coss, NP  diclofenac (VOLTAREN) 75 MG EC tablet Take 1 tablet (75 mg total) by mouth 2 (two) times daily. Patient not taking: Reported on 06/17/2022 01/01/22   Maximiano Coss, NP   Social History   Socioeconomic History   Marital status: Married    Spouse name: [name]   Number of children: 1   Years of education: 14   Highest education level: Not on file  Occupational History   Occupation: Engineer, structural  Tobacco Use   Smoking status: Some Days    Types: Cigars   Smokeless tobacco: Never  Vaping Use   Vaping Use: Never used  Substance and Sexual Activity   Alcohol use: Yes    Comment: occ   Drug use: No   Sexual activity: Yes    Comment: vasectomy  Other Topics Concern   Not on file  Social History Narrative   Lives with his wife.  His 38 year old son lives in [place].   Social Determinants of Health   Financial Resource Strain: Not on file  Food Insecurity: Not on file  Transportation Needs: Not on  file  Physical Activity: Not on file  Stress: Not on file  Social Connections: Not on file  Intimate Partner Violence: Not on file    Review of Systems  Constitutional:  Negative for fatigue and unexpected weight change.  Eyes:  Negative for visual disturbance.  Respiratory:  Negative for cough, chest tightness and shortness of breath.   Cardiovascular:  Negative for chest  pain, palpitations and leg swelling.  Gastrointestinal:  Negative for abdominal pain and blood in stool.  Neurological:  Negative for dizziness, light-headedness and headaches.     Objective:   Vitals:   06/17/22 1043  BP: 134/82  Pulse: 75  Resp: 20  Temp: 97.8 F (36.6 C)  SpO2: 94%  Weight: 251 lb 3.2 oz (113.9 kg)  Height: '6\' 3"'$  (1.905 m)     Physical Exam Vitals reviewed.  Constitutional:      Appearance: He is well-developed.  HENT:     Head: Normocephalic and atraumatic.  Neck:     Vascular: No carotid bruit or JVD.  Cardiovascular:     Rate and Rhythm: Normal rate and regular rhythm.     Heart sounds: Normal heart sounds. No murmur heard. Pulmonary:     Effort: Pulmonary effort is normal.     Breath sounds: Normal breath sounds. No rales.  Musculoskeletal:     Right lower leg: No edema.     Left lower leg: No edema.  Skin:    General: Skin is warm and dry.  Neurological:     Mental Status: He is alert and oriented to person, place, and time.  Psychiatric:        Mood and Affect: Mood normal.        Assessment & Plan:  Ethan Knox is a 51 y.o. male . Essential hypertension - Plan: lisinopril-hydrochlorothiazide (ZESTORETIC) 20-12.5 MG tablet, Comprehensive metabolic panel  -Borderline control, monitor home readings, continue to work on diet/weight loss.  Handout given on blood pressure management with RTC precautions or call/MyChart message if persistent elevated readings for medication adjustment.  Tolerating current regimen. Medications refilled. Labs pending as  above.   Sciatica of right side  -Improved, status post PT, has stretches, exercises at home with option of muscle relaxant temporarily if needed.  RTC precautions  Hyperlipidemia, unspecified hyperlipidemia type - Plan: Comprehensive metabolic panel, Lipid panel  -Check labs, previous ASCVD risk score borderline, family history of heart disease but no early CAD known.  Consider low-dose statin but can decide once we review his ASCVD risk score.  Gastroesophageal reflux disease, unspecified whether esophagitis present - Plan: pantoprazole (PROTONIX) 40 MG tablet  -Stable, continue same dose PPI, trigger avoidance  Depression, unspecified depression type - Plan: DULoxetine (CYMBALTA) 60 MG capsule  -Stable with Cymbalta, has Ambien if needed temporarily for sleep.  Continue same regimen  Meds ordered this encounter  Medications   lisinopril-hydrochlorothiazide (ZESTORETIC) 20-12.5 MG tablet    Sig: Take 1 tablet by mouth daily.    Dispense:  90 tablet    Refill:  1   pantoprazole (PROTONIX) 40 MG tablet    Sig: Take 1 tablet (40 mg total) by mouth daily.    Dispense:  90 tablet    Refill:  3   DULoxetine (CYMBALTA) 60 MG capsule    Sig: Take 1 capsule (60 mg total) by mouth daily.    Dispense:  90 capsule    Refill:  3   Patient Instructions  Keep a record of your blood pressures outside of the office and running higher let me know.  No med changes for now. Thanks for coming in today.   Managing Your Hypertension Hypertension, also called high blood pressure, is when the force of the blood pressing against the walls of the arteries is too strong. Arteries are blood vessels that carry blood from your heart throughout your body. Hypertension forces the heart to work harder  to pump blood and may cause the arteries to become narrow or stiff. Understanding blood pressure readings A blood pressure reading includes a higher number over a lower number: The first, or top, number is called  the systolic pressure. It is a measure of the pressure in your arteries as your heart beats. The second, or bottom number, is called the diastolic pressure. It is a measure of the pressure in your arteries as the heart relaxes. For most people, a normal blood pressure is below 120/80. Your personal target blood pressure may vary depending on your medical conditions, your age, and other factors. Blood pressure is classified into four stages. Based on your blood pressure reading, your health care provider may use the following stages to determine what type of treatment you need, if any. Systolic pressure and diastolic pressure are measured in a unit called millimeters of mercury (mmHg). Normal Systolic pressure: below 973. Diastolic pressure: below 80. Elevated Systolic pressure: 532-992. Diastolic pressure: below 80. Hypertension stage 1 Systolic pressure: 426-834. Diastolic pressure: 19-62. Hypertension stage 2 Systolic pressure: 229 or above. Diastolic pressure: 90 or above. How can this condition affect me? Managing your hypertension is very important. Over time, hypertension can damage the arteries and decrease blood flow to parts of the body, including the brain, heart, and kidneys. Having untreated or uncontrolled hypertension can lead to: A heart attack. A stroke. A weakened blood vessel (aneurysm). Heart failure. Kidney damage. Eye damage. Memory and concentration problems. Vascular dementia. What actions can I take to manage this condition? Hypertension can be managed by making lifestyle changes and possibly by taking medicines. Your health care provider will help you make a plan to bring your blood pressure within a normal range. You may be referred for counseling on a healthy diet and physical activity. Nutrition  Eat a diet that is high in fiber and potassium, and low in salt (sodium), added sugar, and fat. An example eating plan is called the DASH diet. DASH stands for  Dietary Approaches to Stop Hypertension. To eat this way: Eat plenty of fresh fruits and vegetables. Try to fill one-half of your plate at each meal with fruits and vegetables. Eat whole grains, such as whole-wheat pasta, brown rice, or whole-grain bread. Fill about one-fourth of your plate with whole grains. Eat low-fat dairy products. Avoid fatty cuts of meat, processed or cured meats, and poultry with skin. Fill about one-fourth of your plate with lean proteins such as fish, chicken without skin, beans, eggs, and tofu. Avoid pre-made and processed foods. These tend to be higher in sodium, added sugar, and fat. Reduce your daily sodium intake. Many people with hypertension should eat less than 1,500 mg of sodium a day. Lifestyle  Work with your health care provider to maintain a healthy body weight or to lose weight. Ask what an ideal weight is for you. Get at least 30 minutes of exercise that causes your heart to beat faster (aerobic exercise) most days of the week. Activities may include walking, swimming, or biking. Include exercise to strengthen your muscles (resistance exercise), such as weight lifting, as part of your weekly exercise routine. Try to do these types of exercises for 30 minutes at least 3 days a week. Do not use any products that contain nicotine or tobacco. These products include cigarettes, chewing tobacco, and vaping devices, such as e-cigarettes. If you need help quitting, ask your health care provider. Control any long-term (chronic) conditions you have, such as high cholesterol or diabetes. Identify  your sources of stress and find ways to manage stress. This may include meditation, deep breathing, or making time for fun activities. Alcohol use Do not drink alcohol if: Your health care provider tells you not to drink. You are pregnant, may be pregnant, or are planning to become pregnant. If you drink alcohol: Limit how much you have to: 0-1 drink a day for women. 0-2  drinks a day for men. Know how much alcohol is in your drink. In the [place] one drink equals one 12 oz bottle of beer (355 mL), one 5 oz glass of wine (148 mL), or one 1 oz glass of hard liquor (44 mL). Medicines Your health care provider may prescribe medicine if lifestyle changes are not enough to get your blood pressure under control and if: Your systolic blood pressure is 130 or higher. Your diastolic blood pressure is 80 or higher. Take medicines only as told by your health care provider. Follow the directions carefully. Blood pressure medicines must be taken as told by your health care provider. The medicine does not work as well when you skip doses. Skipping doses also puts you at risk for problems. Monitoring Before you monitor your blood pressure: Do not smoke, drink caffeinated beverages, or exercise within 30 minutes before taking a measurement. Use the bathroom and empty your bladder (urinate). Sit quietly for at least 5 minutes before taking measurements. Monitor your blood pressure at home as told by your health care provider. To do this: Sit with your back straight and supported. Place your feet flat on the floor. Do not cross your legs. Support your arm on a flat surface, such as a table. Make sure your upper arm is at heart level. Each time you measure, take two or three readings one minute apart and record the results. You may also need to have your blood pressure checked regularly by your health care provider. General information Talk with your health care provider about your diet, exercise habits, and other lifestyle factors that may be contributing to hypertension. Review all the medicines you take with your health care provider because there may be side effects or interactions. Keep all follow-up visits. Your health care provider can help you create and adjust your plan for managing your high blood pressure. Where to find more information National Heart, Lung, and Blood  Institute: https://wilson-eaton.com/ American Heart Association: www.heart.org Contact a health care provider if: You think you are having a reaction to medicines you have taken. You have repeated (recurrent) headaches. You feel dizzy. You have swelling in your ankles. You have trouble with your vision. Get help right away if: You develop a severe headache or confusion. You have unusual weakness or numbness, or you feel faint. You have severe pain in your chest or abdomen. You vomit repeatedly. You have trouble breathing. These symptoms may be an emergency. Get help right away. Call 911. Do not wait to see if the symptoms will go away. Do not drive yourself to the hospital. Summary Hypertension is when the force of blood pumping through your arteries is too strong. If this condition is not controlled, it may put you at risk for serious complications. Your personal target blood pressure may vary depending on your medical conditions, your age, and other factors. For most people, a normal blood pressure is less than 120/80. Hypertension is managed by lifestyle changes, medicines, or both. Lifestyle changes to help manage hypertension include losing weight, eating a healthy, low-sodium diet, exercising more, stopping smoking, and limiting  alcohol. This information is not intended to replace advice given to you by your health care provider. Make sure you discuss any questions you have with your health care provider. Document Revised: 07/12/2021 Document Reviewed: 07/12/2021 Elsevier Patient Education  [place],   Merri Ray, MD [facility] 06/17/22 11:45 AM

## 2022-06-18 LAB — LIPID PANEL
Cholesterol: 202 mg/dL — ABNORMAL HIGH (ref 0–200)
HDL: 42.4 mg/dL (ref >39.00)
LDL Cholesterol: 124 mg/dL — ABNORMAL HIGH (ref 0–99)
NonHDL: 159.63
Total CHOL/HDL Ratio: 5
Triglycerides: 178 mg/dL — ABNORMAL HIGH (ref 0.0–149.0)
VLDL: 35.6 mg/dL (ref 0.0–40.0)

## 2022-06-18 LAB — COMPREHENSIVE METABOLIC PANEL
ALT: 34 U/L (ref 0–53)
AST: 26 U/L (ref 0–37)
Albumin: 4.8 g/dL (ref 3.5–5.2)
Alkaline Phosphatase: 57 U/L (ref 39–117)
BUN: 20 mg/dL (ref 6–23)
CO2: 25 mEq/L (ref 19–32)
Calcium: 10.1 mg/dL (ref 8.4–10.5)
Chloride: 99 mEq/L (ref 96–112)
Creatinine, Ser: 1 mg/dL (ref 0.40–1.50)
GFR: 88.59 mL/min (ref >60.00)
Glucose, Bld: 100 mg/dL — ABNORMAL HIGH (ref 70–99)
Potassium: 4.2 mEq/L (ref 3.5–5.1)
Sodium: 135 mEq/L (ref 135–145)
Total Bilirubin: 0.9 mg/dL (ref 0.2–1.2)
Total Protein: 7.7 g/dL (ref 6.0–8.3)

## 2022-06-24 ENCOUNTER — Ambulatory Visit: Payer: 59 | Admitting: Family Medicine

## 2022-12-23 ENCOUNTER — Encounter: Payer: 59 | Admitting: Family Medicine

## 2023-01-10 ENCOUNTER — Encounter: Payer: Self-pay | Admitting: Family Medicine

## 2023-01-10 ENCOUNTER — Ambulatory Visit: Payer: 59 | Admitting: Family Medicine

## 2023-01-10 VITALS — BP 118/70 | HR 76 | Temp 97.7°F | Ht 75.0 in | Wt 262.6 lb

## 2023-01-10 DIAGNOSIS — K219 Gastro-esophageal reflux disease without esophagitis: Secondary | ICD-10-CM | POA: Diagnosis not present

## 2023-01-10 DIAGNOSIS — B009 Herpesviral infection, unspecified: Secondary | ICD-10-CM

## 2023-01-10 DIAGNOSIS — Z23 Encounter for immunization: Secondary | ICD-10-CM

## 2023-01-10 DIAGNOSIS — R739 Hyperglycemia, unspecified: Secondary | ICD-10-CM

## 2023-01-10 DIAGNOSIS — E785 Hyperlipidemia, unspecified: Secondary | ICD-10-CM | POA: Diagnosis not present

## 2023-01-10 DIAGNOSIS — R3915 Urgency of urination: Secondary | ICD-10-CM | POA: Diagnosis not present

## 2023-01-10 DIAGNOSIS — F32A Depression, unspecified: Secondary | ICD-10-CM

## 2023-01-10 DIAGNOSIS — I10 Essential (primary) hypertension: Secondary | ICD-10-CM

## 2023-01-10 DIAGNOSIS — G4726 Circadian rhythm sleep disorder, shift work type: Secondary | ICD-10-CM

## 2023-01-10 LAB — COMPREHENSIVE METABOLIC PANEL
ALT: 29 U/L (ref 0–53)
AST: 19 U/L (ref 0–37)
Albumin: 4.5 g/dL (ref 3.5–5.2)
Alkaline Phosphatase: 57 U/L (ref 39–117)
BUN: 21 mg/dL (ref 6–23)
CO2: 28 mEq/L (ref 19–32)
Calcium: 10.1 mg/dL (ref 8.4–10.5)
Chloride: 101 mEq/L (ref 96–112)
Creatinine, Ser: 1.03 mg/dL (ref 0.40–1.50)
GFR: 85.17 mL/min (ref >60.00)
Glucose, Bld: 101 mg/dL — ABNORMAL HIGH (ref 70–99)
Potassium: 4.5 mEq/L (ref 3.5–5.1)
Sodium: 138 mEq/L (ref 135–145)
Total Bilirubin: 0.5 mg/dL (ref 0.2–1.2)
Total Protein: 7.2 g/dL (ref 6.0–8.3)

## 2023-01-10 LAB — LIPID PANEL
Cholesterol: 181 mg/dL (ref 0–200)
HDL: 41.5 mg/dL (ref >39.00)
LDL Cholesterol: 115 mg/dL — ABNORMAL HIGH (ref 0–99)
NonHDL: 139.21
Total CHOL/HDL Ratio: 4
Triglycerides: 123 mg/dL (ref 0.0–149.0)
VLDL: 24.6 mg/dL (ref 0.0–40.0)

## 2023-01-10 LAB — PSA: PSA: 0.59 ng/mL (ref 0.10–4.00)

## 2023-01-10 LAB — HEMOGLOBIN A1C: Hgb A1c MFr Bld: 5.4 % (ref 4.6–6.5)

## 2023-01-10 MED ORDER — LISINOPRIL-HYDROCHLOROTHIAZIDE 20-12.5 MG PO TABS: 1.0000 | ORAL_TABLET | Freq: Every day | ORAL | 1 refills | Status: DC

## 2023-01-10 MED ORDER — VALACYCLOVIR HCL 500 MG PO TABS: 500.0000 mg | ORAL_TABLET | Freq: Every day | ORAL | 3 refills | Status: DC

## 2023-01-10 MED ORDER — PANTOPRAZOLE SODIUM 40 MG PO TBEC: 40.0000 mg | DELAYED_RELEASE_TABLET | Freq: Every day | ORAL | 3 refills | Status: DC

## 2023-01-10 MED ORDER — DULOXETINE HCL 60 MG PO CPEP: 60.0000 mg | ORAL_CAPSULE | Freq: Every day | ORAL | 3 refills | Status: DC

## 2023-01-10 NOTE — Patient Instructions (Signed)
Thanks for coming in today.  No med changes at this time.  Continue to bike, spend time with family, other stress management as we discussed but let me know if any other resources needed, especially during stressful times or if any med changes are needed. I will check a prostate test with the sensation to urinate with standing but if that worsens or new symptoms follow-up and we can discuss other testing or possible urology evaluation.  If the PSA is abnormal can look into other testing as well. I will check some blood work today.  Blood sugars been a few points elevated previously, I will check a 35-monthaverage.  Follow-up in 6 months, but let me know if there are any questions in the meantime.  Take care.

## 2023-01-10 NOTE — Assessment & Plan Note (Signed)
Stable, tolerating current meds, check labs.  Continue same dose lisinopril HCTZ.

## 2023-01-10 NOTE — Assessment & Plan Note (Signed)
Overall stable some increased stress with work stressors, but has coping techniques in place.  Declines any med changes at this time.  Continue to monitor but no changes for now.  41-monthfollow-up, sooner if any new or worsening symptoms.

## 2023-01-10 NOTE — Assessment & Plan Note (Signed)
Stable with preventative dose.  Continue same dose Valtrex, 1 year refills provided.

## 2023-01-10 NOTE — Assessment & Plan Note (Signed)
Stable, without recent need for Ambien, has a few left if needed, okay to refill if needed.

## 2023-01-10 NOTE — Progress Notes (Signed)
Subjective:  Patient ID: Ethan Knox, male    DOB: 08-Mar-1971  Age: 52 y.o. MRN: [id number]  CC:  Chief Complaint  Patient presents with   Hypertension   Medication Refill    Patient wants rx changed to [facility]     HPI Ethan Knox presents for   Hypertension: Lisinopril HCT 20/12.5 mg daily, no new side effects.  Home readings: none.  BP Readings from Last 3 Encounters:  01/10/23 118/70  06/17/22 134/82  03/18/22 128/74   Lab Results  Component Value Date   CREATININE 1.00 06/17/2022   GERD Protonix 40 mg daily with rare breakthrough symptoms in the past, noted with certain triggers - tomato sauce. Doing well.   History of HSV-2 infection Preventative treatment with Valtrex 500 mg daily.  Last flare - none recent. Off meds since November.  Notes some urinary urgency when standing past few months, no dysuria/ hematuria/nocturia.  Lab Results  Component Value Date   PSA 0.94 06/24/2013    Depression: Cymbalta 60 mg daily with Ambien if needed, shift work sleep difficulties.  Rare use of Ambien Busier with work, some increased stress. Handling ok. No need for counseling at this time. Has down time, family time that helps. Some riding. Bike team with work.      01/10/2023    8:02 AM 06/17/2022   10:42 AM 02/04/2022   10:48 AM 12/20/2021    2:12 PM 08/23/2020   11:25 AM  Depression screen PHQ 2/9  Decreased Interest 0 0 1 1 0  Down, Depressed, Hopeless 0 0 0 1 0  PHQ - 2 Score 0 0 1 2 0  Altered sleeping 0 '1 1 1   '$ Tired, decreased energy 0 '1 1 1   '$ Change in appetite 0 0 0 0   Feeling bad or failure about yourself  0 0 0 0   Trouble concentrating 0 0 0 0   Moving slowly or fidgety/restless 0 0 0 0   Suicidal thoughts 0 0 0 0   PHQ-9 Score 0 '2 3 4      '$ History Patient Active Problem List   Diagnosis Date Noted   Depression 01/10/2023   Gastric nodule    Heart murmur 01/11/2020   Essential hypertension 12/14/2019   Visit for suture removal  12/14/2019   HSV-2 (herpes simplex virus 2) infection    Sleep disorder, shift work     Past Medical History:  Diagnosis Date   Depression    HSV-2 (herpes simplex virus 2) infection    Hypertension    Kidney stone    Sleep disorder, shift work       Review of Systems Per HPI.   Objective:   Vitals:   01/10/23 0804  BP: 118/70  Pulse: 76  Temp: 97.7 F (36.5 C)  TempSrc: Temporal  SpO2: 97%  Weight: 262 lb 9.6 oz (119.1 kg)  Height: '6\' 3"'$  (1.905 m)     Physical Exam Vitals reviewed.  Constitutional:      Appearance: He is well-developed.  HENT:     Head: Normocephalic and atraumatic.  Neck:     Vascular: No carotid bruit or JVD.  Cardiovascular:     Rate and Rhythm: Normal rate and regular rhythm.     Heart sounds: Normal heart sounds. No murmur heard. Pulmonary:     Effort: Pulmonary effort is normal.     Breath sounds: Normal breath sounds. No rales.  Abdominal:     General: Abdomen  is flat. Bowel sounds are normal. There is no distension.     Tenderness: There is no abdominal tenderness. There is no guarding.  Musculoskeletal:     Right lower leg: No edema.     Left lower leg: No edema.  Skin:    General: Skin is warm and dry.  Neurological:     Mental Status: He is alert and oriented to person, place, and time.  Psychiatric:        Mood and Affect: Mood normal.     Assessment & Plan:  Ethan Knox is a 52 y.o. male . Urinary urgency -     PSA check PSA, symptoms only with standing.  If elevated PSA return for other testing or urology eval.  Essential hypertension Assessment & Plan: Stable, tolerating current meds, check labs.  Continue same dose lisinopril HCTZ.  Orders: -     Lisinopril-hydroCHLOROthiazide; Take 1 tablet by mouth daily.  Dispense: 90 tablet; Refill: 1  HSV infection -     valACYclovir HCl; Take 1 tablet (500 mg total) by mouth daily.  Dispense: 90 tablet; Refill: 3  Gastroesophageal reflux disease, unspecified  whether esophagitis present -     Pantoprazole Sodium; Take 1 tablet (40 mg total) by mouth daily.  Dispense: 90 tablet; Refill: 3, stable with current dosage, continue same with trigger avoidance.  Sleep disorder, shift work Assessment & Plan: Stable, without recent need for Ambien, has a few left if needed, okay to refill if needed.   Hyperlipidemia, unspecified hyperlipidemia type -     Comprehensive metabolic panel -     Lipid panel  Hyperglycemia -     Hemoglobin A1c, check A1c as mild hyperglycemia previously.  Depression, unspecified depression type Assessment & Plan: Overall stable some increased stress with work stressors, but has coping techniques in place.  Declines any med changes at this time.  Continue to monitor but no changes for now.  64-monthfollow-up, sooner if any new or worsening symptoms.  Orders: -     DULoxetine HCl; Take 1 capsule (60 mg total) by mouth daily.  Dispense: 90 capsule; Refill: 3  HSV-2 (herpes simplex virus 2) infection Assessment & Plan: Stable with preventative dose.  Continue same dose Valtrex, 1 year refills provided.   Other orders -     Flu Vaccine QUAD 666moM (Fluarix, Fluzone & Alfiuria Quad PF)    Patient Instructions  Thanks for coming in today.  No med changes at this time.  Continue to bike, spend time with family, other stress management as we discussed but let me know if any other resources needed, especially during stressful times or if any med changes are needed. I will check a prostate test with the sensation to urinate with standing but if that worsens or new symptoms follow-up and we can discuss other testing or possible urology evaluation.  If the PSA is abnormal can look into other testing as well. I will check some blood work today.  Blood sugars been a few points elevated previously, I will check a 3-60-montherage.  Follow-up in 6 months, but let me know if there are any questions in the meantime.  Take care.      Signed,   JefMerri RayD LeBLake HolidayumChisholmoup 01/10/23 8:41 AM

## 2023-06-26 ENCOUNTER — Encounter (INDEPENDENT_AMBULATORY_CARE_PROVIDER_SITE_OTHER): Payer: Self-pay

## 2023-07-14 ENCOUNTER — Other Ambulatory Visit: Payer: Self-pay | Admitting: Family Medicine

## 2023-07-14 DIAGNOSIS — I10 Essential (primary) hypertension: Secondary | ICD-10-CM

## 2023-07-28 ENCOUNTER — Ambulatory Visit: Payer: 59 | Admitting: Family Medicine

## 2023-07-28 VITALS — BP 118/70 | HR 50 | Temp 98.1°F | Wt 253.4 lb

## 2023-07-28 DIAGNOSIS — F32A Depression, unspecified: Secondary | ICD-10-CM | POA: Diagnosis not present

## 2023-07-28 DIAGNOSIS — K219 Gastro-esophageal reflux disease without esophagitis: Secondary | ICD-10-CM | POA: Diagnosis not present

## 2023-07-28 DIAGNOSIS — E785 Hyperlipidemia, unspecified: Secondary | ICD-10-CM

## 2023-07-28 DIAGNOSIS — N529 Male erectile dysfunction, unspecified: Secondary | ICD-10-CM

## 2023-07-28 DIAGNOSIS — I10 Essential (primary) hypertension: Secondary | ICD-10-CM

## 2023-07-28 DIAGNOSIS — Z23 Encounter for immunization: Secondary | ICD-10-CM | POA: Diagnosis not present

## 2023-07-28 LAB — COMPREHENSIVE METABOLIC PANEL
ALT: 28 U/L (ref 0–53)
AST: 20 U/L (ref 0–37)
Albumin: 4.4 g/dL (ref 3.5–5.2)
Alkaline Phosphatase: 50 U/L (ref 39–117)
BUN: 24 mg/dL — ABNORMAL HIGH (ref 6–23)
CO2: 24 meq/L (ref 19–32)
Calcium: 9.2 mg/dL (ref 8.4–10.5)
Chloride: 103 meq/L (ref 96–112)
Creatinine, Ser: 0.95 mg/dL (ref 0.40–1.50)
GFR: 93.49 mL/min (ref >60.00)
Glucose, Bld: 105 mg/dL — ABNORMAL HIGH (ref 70–99)
Potassium: 3.8 meq/L (ref 3.5–5.1)
Sodium: 136 meq/L (ref 135–145)
Total Bilirubin: 0.7 mg/dL (ref 0.2–1.2)
Total Protein: 7.3 g/dL (ref 6.0–8.3)

## 2023-07-28 LAB — LIPID PANEL
Cholesterol: 167 mg/dL (ref 0–200)
HDL: 44.2 mg/dL (ref >39.00)
LDL Cholesterol: 99 mg/dL (ref 0–99)
NonHDL: 123.07
Total CHOL/HDL Ratio: 4
Triglycerides: 121 mg/dL (ref 0.0–149.0)
VLDL: 24.2 mg/dL (ref 0.0–40.0)

## 2023-07-28 MED ORDER — SILDENAFIL CITRATE 20 MG PO TABS: 20.0000 mg | ORAL_TABLET | Freq: Every day | ORAL | 3 refills | Status: DC | PRN

## 2023-07-28 NOTE — Progress Notes (Unsigned)
Subjective:  Patient ID: Ethan Knox, male    DOB: 1971/07/26  Age: 52 y.o. MRN: [id number]  CC:  Chief Complaint  Patient presents with   Medical Management of Chronic Issues    6 mo f/u     HPI Ethan Knox presents for follow-up of chronic conditions.   Hypertension: Lisinopril HCTZ 20/12.5 mg daily Home readings: none, no new med side effects.  BP Readings from Last 3 Encounters:  07/28/23 118/70  01/10/23 118/70  06/17/22 134/82   Lab Results  Component Value Date   CREATININE 1.03 01/10/2023   GERD Protonix 40 mg daily with rare breakthrough symptoms, typically noted with certain trigger foods. No issues.   History of HSV infection Treated with Valtrex daily for prevention in the past, but had been off meds since November at his March visit. No recent flares - on daily valtrex.   Urinary urgency Noted for a few months when discussed in March.  No associated dysuria, hematuria, nocturia.  PSA was normal at that time, RTC precautions discussed if persistence of symptoms. Resolved on own - no recent symptoms.  Lab Results  Component Value Date   PSA 0.59 01/10/2023   PSA 0.94 06/24/2013   Erectile dysfunction: Has used sildenafil at times in the past - no HA, flushing or hearing/vision changes, no CP/dyspnea with exertion.  Able to achieve erection, sometimes difficulty with maintaining erection. Chronic delayed orgasm with Cymbalta.    Depression: Cymbalta 60 mg daily with Ambien if needed.  History of shiftwork sleep disorder/difficulty.  Rare use of Ambien when discussed in March.  He was having some increased stress and busier with work at that time but felt like he was handling things okay and declined need for counseling.  Continued coping techniques and self care discussed including biking, spending time with family. 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  Stress with busier work but  coping well, managing well.      07/28/2023    9:32 AM 01/10/2023    8:02 AM 06/17/2022   10:42 AM 02/04/2022   10:48 AM 12/20/2021    2:12 PM  Depression screen PHQ 2/9  Decreased Interest 0 0 0 1 1  Down, Depressed, Hopeless 0 0 0 0 1  PHQ - 2 Score 0 0 0 1 2  Altered sleeping 1 0 1 1 1   Tired, decreased energy 1 0 1 1 1   Change in appetite 0 0 0 0 0  Feeling bad or failure about yourself  0 0 0 0 0  Trouble concentrating 1 0 0 0 0  Moving slowly or fidgety/restless 1 0 0 0 0  Suicidal thoughts 0 0 0 0 0  PHQ-9 Score 4 0 2 3 4   Difficult doing work/chores Not difficult at all            History Patient Active Problem List   Diagnosis Date Noted   Depression 01/10/2023   Gastric nodule    Heart murmur 01/11/2020   Essential hypertension 12/14/2019   Visit for suture removal 12/14/2019   HSV-2 (herpes simplex virus 2) infection    Sleep disorder, shift work    Past Medical History:  Diagnosis Date   Depression    HSV-2 (herpes simplex virus 2) infection    Hypertension    Kidney stone    Sleep disorder, shift work    Past Surgical History:  Procedure Laterality Date  BIOPSY  06/08/2020   Procedure: BIOPSY;  Surgeon: Rachael Fee, MD;  Location: [facility];  Service: Endoscopy;;   ESOPHAGOGASTRODUODENOSCOPY (EGD) WITH PROPOFOL N/A 06/08/2020   Procedure: ESOPHAGOGASTRODUODENOSCOPY (EGD) WITH PROPOFOL;  Surgeon: Rachael Fee, MD;  Location: [facility];  Service: Endoscopy;  Laterality: N/A;   ESOPHAGOGASTRODUODENOSCOPY (EGD) WITH PROPOFOL N/A 08/30/2021   Procedure: ESOPHAGOGASTRODUODENOSCOPY (EGD) WITH PROPOFOL;  Surgeon: Rachael Fee, MD;  Location: [facility];  Service: Endoscopy;  Laterality: N/A;   EUS N/A 06/08/2020   Procedure: UPPER ENDOSCOPIC ULTRASOUND (EUS) RADIAL;  Surgeon: Rachael Fee, MD;  Location: [facility];  Service: Endoscopy;  Laterality: N/A;   EUS N/A 08/30/2021   Procedure: UPPER ENDOSCOPIC ULTRASOUND (EUS) RADIAL;   Surgeon: Rachael Fee, MD;  Location: [facility];  Service: Endoscopy;  Laterality: N/A;   VASECTOMY     Allergies  Allergen Reactions   Prilosec [Omeprazole Magnesium] Rash   Prior to Admission medications   Medication Sig Start Date End Date Taking? Authorizing Provider  cetirizine (ZYRTEC) 10 MG tablet Take 10 mg by mouth daily.   Yes [provider]  DULoxetine (CYMBALTA) 60 MG capsule Take 1 capsule (60 mg total) by mouth daily. 01/10/23  Yes Shade Flood, MD  fluticasone [facility]) 50 MCG/ACT nasal spray Place 1 spray into both nostrils daily as needed for allergies or rhinitis.   Yes [provider]  lisinopril-hydrochlorothiazide (ZESTORETIC) 20-12.5 MG tablet TAKE 1 TABLET BY MOUTH DAILY 07/14/23  Yes Shade Flood, MD  pantoprazole (PROTONIX) 40 MG tablet Take 1 tablet (40 mg total) by mouth daily. 01/10/23  Yes Shade Flood, MD  valACYclovir (VALTREX) 500 MG tablet Take 1 tablet (500 mg total) by mouth daily. 01/10/23  Yes Shade Flood, MD   Social History   Socioeconomic History   Marital status: Married    Spouse name: Clinton Gallant   Number of children: 1   Years of education: 14   Highest education level: Not on file  Occupational History   Occupation: Emergency planning/management officer  Tobacco Use   Smoking status: Some Days    Types: Cigars   Smokeless tobacco: Never  Vaping Use   Vaping status: Never Used  Substance and Sexual Activity   Alcohol use: Yes    Comment: occ   Drug use: No   Sexual activity: Yes    Comment: vasectomy  Other Topics Concern   Not on file  Social History Narrative   Lives with his wife.  His 60 year old son lives in [place].   Social Determinants of Health   Financial Resource Strain: Not on file  Food Insecurity: Not on file  Transportation Needs: Not on file  Physical Activity: Not on file  Stress: Not on file  Social Connections: Unknown (03/26/2022)   Received from [facility]   Social  Network    Social Network: Not on file  Intimate Partner Violence: Unknown (02/15/2022)   Received from [facility], [facility]   HITS    Physically Hurt: Not on file    Insult or Talk Down To: Not on file    Threaten Physical Harm: Not on file    Scream or Curse: Not on file    Review of Systems  Constitutional:  Negative for fatigue and unexpected weight change.  Eyes:  Negative for visual disturbance.  Respiratory:  Negative for cough, chest tightness and shortness of breath.   Cardiovascular:  Negative for chest pain, palpitations and  leg swelling.  Gastrointestinal:  Negative for abdominal pain and blood in stool.  Neurological:  Negative for dizziness, light-headedness and headaches.     Objective:   Vitals:   07/28/23 0927  BP: 118/70  Pulse: (!) 50  Temp: 98.1 F (36.7 C)  TempSrc: Temporal  SpO2: 98%  Weight: 253 lb 6.4 oz (114.9 kg)     Physical Exam Vitals reviewed.  Constitutional:      Appearance: He is well-developed.  HENT:     Head: Normocephalic and atraumatic.  Neck:     Vascular: No carotid bruit or JVD.  Cardiovascular:     Rate and Rhythm: Normal rate and regular rhythm.     Heart sounds: Normal heart sounds. No murmur heard. Pulmonary:     Effort: Pulmonary effort is normal.     Breath sounds: Normal breath sounds. No rales.  Musculoskeletal:     Right lower leg: No edema.     Left lower leg: No edema.  Skin:    General: Skin is warm and dry.  Neurological:     Mental Status: He is alert and oriented to person, place, and time.  Psychiatric:        Mood and Affect: Mood normal.        Assessment & Plan:  Ethan Knox is a 52 y.o. male . Essential hypertension - Plan: Comprehensive metabolic panel  Gastroesophageal reflux disease, unspecified whether esophagitis present  Hyperlipidemia, unspecified hyperlipidemia type - Plan: Lipid panel, Comprehensive metabolic panel  Depression, unspecified depression  type  Erectile dysfunction, unspecified erectile dysfunction type - Plan: sildenafil (REVATIO) 20 MG tablet  Need for immunization against influenza   Meds ordered this encounter  Medications   sildenafil (REVATIO) 20 MG tablet    Sig: Take 1-2 tablets (20-40 mg total) by mouth daily as needed (prior to intercourse.).    Dispense:  30 tablet    Refill:  3   Patient Instructions  Thanks for coming in today. Sildenafil refilled, 1 to 2 pills about 30 minutes prior to need.  As we discussed we could make a change in the Cymbalta if persistent sexual side effects.  Let me know. Blood pressure looks good today, will check labs, no med changes at this time.  62-month follow-up for physical but let me know if you have any questions or concerns sooner.  Take care.     Signed,   Meredith Staggers, MD [facility] 07/28/23 10:02 AM

## 2023-07-28 NOTE — Patient Instructions (Signed)
Thanks for coming in today. Sildenafil refilled, 1 to 2 pills about 30 minutes prior to need.  As we discussed we could make a change in the Cymbalta if persistent sexual side effects.  Let me know. Blood pressure looks good today, will check labs, no med changes at this time.  77-month follow-up for physical but let me know if you have any questions or concerns sooner.  Take care.

## 2023-07-30 ENCOUNTER — Encounter: Payer: Self-pay | Admitting: Family Medicine

## 2024-01-26 ENCOUNTER — Ambulatory Visit (INDEPENDENT_AMBULATORY_CARE_PROVIDER_SITE_OTHER): Payer: 59 | Admitting: Family Medicine

## 2024-01-26 ENCOUNTER — Encounter: Payer: Self-pay | Admitting: Family Medicine

## 2024-01-26 VITALS — BP 118/80 | HR 56 | Temp 97.9°F | Ht 75.0 in | Wt 260.0 lb

## 2024-01-26 DIAGNOSIS — K3189 Other diseases of stomach and duodenum: Secondary | ICD-10-CM

## 2024-01-26 DIAGNOSIS — Z114 Encounter for screening for human immunodeficiency virus [HIV]: Secondary | ICD-10-CM

## 2024-01-26 DIAGNOSIS — N529 Male erectile dysfunction, unspecified: Secondary | ICD-10-CM

## 2024-01-26 DIAGNOSIS — I10 Essential (primary) hypertension: Secondary | ICD-10-CM

## 2024-01-26 DIAGNOSIS — K219 Gastro-esophageal reflux disease without esophagitis: Secondary | ICD-10-CM

## 2024-01-26 DIAGNOSIS — Z0000 Encounter for general adult medical examination without abnormal findings: Secondary | ICD-10-CM

## 2024-01-26 DIAGNOSIS — R739 Hyperglycemia, unspecified: Secondary | ICD-10-CM | POA: Diagnosis not present

## 2024-01-26 DIAGNOSIS — E785 Hyperlipidemia, unspecified: Secondary | ICD-10-CM | POA: Diagnosis not present

## 2024-01-26 DIAGNOSIS — F32A Depression, unspecified: Secondary | ICD-10-CM

## 2024-01-26 DIAGNOSIS — Z1159 Encounter for screening for other viral diseases: Secondary | ICD-10-CM

## 2024-01-26 DIAGNOSIS — Z125 Encounter for screening for malignant neoplasm of prostate: Secondary | ICD-10-CM | POA: Diagnosis not present

## 2024-01-26 LAB — COMPREHENSIVE METABOLIC PANEL
ALT: 31 U/L (ref 0–53)
AST: 21 U/L (ref 0–37)
Albumin: 4.8 g/dL (ref 3.5–5.2)
Alkaline Phosphatase: 59 U/L (ref 39–117)
BUN: 23 mg/dL (ref 6–23)
CO2: 27 meq/L (ref 19–32)
Calcium: 9.8 mg/dL (ref 8.4–10.5)
Chloride: 102 meq/L (ref 96–112)
Creatinine, Ser: 0.96 mg/dL (ref 0.40–1.50)
GFR: 92 mL/min (ref >60.00)
Glucose, Bld: 100 mg/dL — ABNORMAL HIGH (ref 70–99)
Potassium: 4.2 meq/L (ref 3.5–5.1)
Sodium: 138 meq/L (ref 135–145)
Total Bilirubin: 0.6 mg/dL (ref 0.2–1.2)
Total Protein: 7.3 g/dL (ref 6.0–8.3)

## 2024-01-26 LAB — CBC WITH DIFFERENTIAL/PLATELET
Basophils Absolute: 0 10*3/uL (ref 0.0–0.1)
Basophils Relative: 0.5 % (ref 0.0–3.0)
Eosinophils Absolute: 0.1 10*3/uL (ref 0.0–0.7)
Eosinophils Relative: 1.9 % (ref 0.0–5.0)
HCT: 44.4 % (ref 39.0–52.0)
Hemoglobin: 15.6 g/dL (ref 13.0–17.0)
Lymphocytes Relative: 45.6 % (ref 12.0–46.0)
Lymphs Abs: 2.9 10*3/uL (ref 0.7–4.0)
MCHC: 35.1 g/dL (ref 30.0–36.0)
MCV: 96.1 fl (ref 78.0–100.0)
Monocytes Absolute: 0.4 10*3/uL (ref 0.1–1.0)
Monocytes Relative: 6.9 % (ref 3.0–12.0)
Neutro Abs: 2.9 10*3/uL (ref 1.4–7.7)
Neutrophils Relative %: 45.1 % (ref 43.0–77.0)
Platelets: 275 10*3/uL (ref 150.0–400.0)
RBC: 4.62 Mil/uL (ref 4.22–5.81)
RDW: 12.9 % (ref 11.5–15.5)
WBC: 6.4 10*3/uL (ref 4.0–10.5)

## 2024-01-26 LAB — PSA: PSA: 0.55 ng/mL (ref 0.10–4.00)

## 2024-01-26 LAB — LIPID PANEL
Cholesterol: 194 mg/dL (ref 0–200)
HDL: 48 mg/dL (ref >39.00)
LDL Cholesterol: 116 mg/dL — ABNORMAL HIGH (ref 0–99)
NonHDL: 145.52
Total CHOL/HDL Ratio: 4
Triglycerides: 149 mg/dL (ref 0.0–149.0)
VLDL: 29.8 mg/dL (ref 0.0–40.0)

## 2024-01-26 LAB — HEMOGLOBIN A1C: Hgb A1c MFr Bld: 5.4 % (ref 4.6–6.5)

## 2024-01-26 MED ORDER — PANTOPRAZOLE SODIUM 40 MG PO TBEC: 40.0000 mg | DELAYED_RELEASE_TABLET | Freq: Every day | ORAL | 3 refills | Status: DC

## 2024-01-26 MED ORDER — LISINOPRIL-HYDROCHLOROTHIAZIDE 20-12.5 MG PO TABS: 1.0000 | ORAL_TABLET | Freq: Every day | ORAL | 1 refills | Status: DC

## 2024-01-26 MED ORDER — DULOXETINE HCL 60 MG PO CPEP: 60.0000 mg | ORAL_CAPSULE | Freq: Every day | ORAL | 3 refills | Status: AC

## 2024-01-26 MED ORDER — SILDENAFIL CITRATE 20 MG PO TABS
20.0000 mg | ORAL_TABLET | Freq: Every day | ORAL | 3 refills | Status: AC | PRN
Start: 2024-01-26 — End: ?

## 2024-01-26 NOTE — Progress Notes (Signed)
 Subjective:  Patient ID: Ethan Knox, male    DOB: 1970/11/28  Age: 53 y.o. MRN: [id number]  CC:  Chief Complaint  Patient presents with   Annual Exam    Pt is well no concerns today, pt is fasting    Health Maintenance    Patient declined having COVID-19 vaccine, declined getting pneumonia or shingles, accepted HIV and Hep C screening     HPI Ethan Knox presents for Annual Exam  PCP, [facility], Dr. Barron Alvine, Dr. Christella Hartigan.  History of GERD, erosive esophagitis.   Gastric nodule. Endoscopy 08/30/2021.  8.6 mm x 5.1 mm subepithelial lesion.  Gastric wall otherwise normal.  No perigastric adenopathy.  Unchanged or perhaps a bit smaller than July 2021 EUS.  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  Plan for repeat endoscopic ultrasound evaluation in 2 years.  Protonix 40 mg daily Cardiology, Dr. Bjorn Pippin in 2021 for evaluation of murmur.  Suspected benign flow murmur, TTE shows no significant valvular heart disease at that time.  Mild LVH on TTE. No CP/dyspnea.  Dermatology appt next week.    Hypertension: Lisinopril HCTZ 20/12.5 mg daily, no side effects.  Home readings: no home readings.  BP Readings from Last 3 Encounters:  01/26/24 118/80  07/28/23 118/70  01/10/23 118/70   Lab Results  Component Value Date   CREATININE 0.95 07/28/2023   History of HSV infection Daily Valtrex for prevention, no recent flares.  Erectile dysfunction Sildenafil as needed previously.  Delayed time to ejaculation with use of Cymbalta, chronic. Has used 20mg  and works well. No vision/hearing changes, or CP/DOE with exertion/intercourse.  Depression: Treated with Cymbalta 60 mg daily, last discussed in September 2024.  History of shiftwork sleep disorder/difficulty, rare use of Ambien.  Intermittent stressors with work, treated with coping techniques, self-care including biking, spending time with family.  Coping/managing well with stressors at his September visit.  Continue  same dose of Cymbalta.  Ambien has been used as needed for sleep, shiftwork sleep disorder. Managing stressors, cymbalta working ok. Declines need for counseling at this time.       01/26/2024    9:25 AM 07/28/2023    9:32 AM 01/10/2023    8:02 AM 06/17/2022   10:42 AM 02/04/2022   10:48 AM  Depression screen PHQ 2/9  Decreased Interest 0 0 0 0 1  Down, Depressed, Hopeless 0 0 0 0 0  PHQ - 2 Score 0 0 0 0 1  Altered sleeping 0 1 0 1 1  Tired, decreased energy 0 1 0 1 1  Change in appetite 0 0 0 0 0  Feeling bad or failure about yourself  0 0 0 0 0  Trouble concentrating 0 1 0 0 0  Moving slowly or fidgety/restless 0 1 0 0 0  Suicidal thoughts 0 0 0 0 0  PHQ-9 Score 0 4 0 2 3  Difficult doing work/chores  Not difficult at all         Health Maintenance  Topic Date Due   Pneumococcal Vaccine 19-53 Years old (1 of 2 - PCV) Never done   HIV Screening  Never done   Hepatitis C Screening  Never done   Zoster Vaccines- Shingrix (1 of 2) Never done   COVID-19 Vaccine (4 - 2024-25 season) 07/13/2023   Colonoscopy  01/05/2025 (Originally 04/19/2018)   DTaP/Tdap/Td (3 - Td or Tdap) 12/02/2029   INFLUENZA VACCINE  Completed   HPV VACCINES  Aged Out  Colon cancer screening:  Cologuard negative 01/13/2022 Prostate: does not have family history of prostate cancer The natural history of prostate cancer and ongoing controversy regarding screening and potential treatment outcomes of prostate cancer has been discussed with the patient. The meaning of a false positive PSA and a false negative PSA has been discussed. He indicates understanding of the limitations of this screening test and wishes to proceed with screening PSA testing. Lab Results  Component Value Date   PSA 0.59 01/10/2023   PSA 0.94 06/24/2013      Immunization History  Administered Date(s) Administered   Influenza Split 08/04/2012, 07/18/2015   Influenza, Seasonal, Injecte, Preservative Fre 07/28/2023   Influenza,inj,Quad PF,6+  Mos 07/17/2017, 01/01/2019, 09/13/2019, 08/23/2020, 01/10/2023   Influenza,inj,quad, With Preservative 07/31/2017   Influenza-Unspecified 08/23/2021   PFIZER Comirnaty(Gray Top)Covid-19 Tri-Sucrose Vaccine 12/07/2020   PFIZER(Purple Top)SARS-COV-2 Vaccination 01/12/2020, 02/02/2020   Tdap 01/23/2012, 12/03/2019  Shingles vaccine: defers today.  Pneumonia vaccine: defers today.  Covid vaccine booster - defers.  Hep C and HIV screen today.   No results found.  No corrective lenses. Readers. No recent optho eval.   Dental: every 6 months.   Alcohol: less - rare, 2-3/month  Tobacco: cigars - daily.  Cessation discussed.   Exercise: pans to increase bike riding - work related.  No recent mountain biking - some rides planned.  R Wrist issue - month ago, fall, slipped on stairs. Improved. No loss of ROM, pain, swelling initially. Better now. Plans OV if not continuing to improve.    History Patient Active Problem List   Diagnosis Date Noted   Depression 01/10/2023   Gastric nodule    Heart murmur 01/11/2020   Essential hypertension 12/14/2019   Visit for suture removal 12/14/2019   HSV-2 (herpes simplex virus 2) infection    Sleep disorder, shift work    Past Medical History:  Diagnosis Date   Depression    HSV-2 (herpes simplex virus 2) infection    Hypertension    Kidney stone    Sleep disorder, shift work    Past Surgical History:  Procedure Laterality Date   BIOPSY  06/08/2020   Procedure: BIOPSY;  Surgeon: Rachael Fee, MD;  Location: [facility];  Service: Endoscopy;;   ESOPHAGOGASTRODUODENOSCOPY (EGD) WITH PROPOFOL N/A 06/08/2020   Procedure: ESOPHAGOGASTRODUODENOSCOPY (EGD) WITH PROPOFOL;  Surgeon: Rachael Fee, MD;  Location: [facility];  Service: Endoscopy;  Laterality: N/A;   ESOPHAGOGASTRODUODENOSCOPY (EGD) WITH PROPOFOL N/A 08/30/2021   Procedure: ESOPHAGOGASTRODUODENOSCOPY (EGD) WITH PROPOFOL;  Surgeon: Rachael Fee, MD;  Location: [facility];   Service: Endoscopy;  Laterality: N/A;   EUS N/A 06/08/2020   Procedure: UPPER ENDOSCOPIC ULTRASOUND (EUS) RADIAL;  Surgeon: Rachael Fee, MD;  Location: [facility];  Service: Endoscopy;  Laterality: N/A;   EUS N/A 08/30/2021   Procedure: UPPER ENDOSCOPIC ULTRASOUND (EUS) RADIAL;  Surgeon: Rachael Fee, MD;  Location: [facility];  Service: Endoscopy;  Laterality: N/A;   VASECTOMY     Allergies  Allergen Reactions   Prilosec [Omeprazole Magnesium] Rash   Prior to Admission medications   Medication Sig Start Date End Date Taking? Authorizing Provider  cetirizine (ZYRTEC) 10 MG tablet Take 10 mg by mouth daily.   Yes [provider]  DULoxetine (CYMBALTA) 60 MG capsule Take 1 capsule (60 mg total) by mouth daily. 01/10/23  Yes Shade Flood, MD  fluticasone [facility]) 50 MCG/ACT nasal spray Place 1 spray into both nostrils daily as needed for allergies or rhinitis.   Yes [provider]  lisinopril-hydrochlorothiazide (ZESTORETIC) 20-12.5 MG tablet TAKE 1 TABLET BY MOUTH DAILY 07/14/23  Yes Shade Flood, MD  pantoprazole (PROTONIX) 40 MG tablet Take 1 tablet (40 mg total) by mouth daily. 01/10/23  Yes Shade Flood, MD  sildenafil (REVATIO) 20 MG tablet Take 1-2 tablets (20-40 mg total) by mouth daily as needed (prior to intercourse.). 07/28/23  Yes Shade Flood, MD  valACYclovir (VALTREX) 500 MG tablet Take 1 tablet (500 mg total) by mouth daily. 01/10/23  Yes Shade Flood, MD   Social History   Socioeconomic History   Marital status: Married    Spouse name: Clinton Gallant   Number of children: 1   Years of education: 14   Highest education level: Not on file  Occupational History   Occupation: Emergency planning/management officer  Tobacco Use   Smoking status: Some Days    Types: Cigars   Smokeless tobacco: Never  Vaping Use   Vaping status: Never Used  Substance and Sexual Activity   Alcohol use: Yes    Comment: occ   Drug use: No   Sexual activity: Yes     Comment: vasectomy  Other Topics Concern   Not on file  Social History Narrative   Lives with his wife.  His 23 year old son lives in [place].   Social Drivers of Corporate investment banker Strain: Not on file  Food Insecurity: Not on file  Transportation Needs: Not on file  Physical Activity: Not on file  Stress: Not on file  Social Connections: Unknown (03/26/2022)   Received from [facility], [facility]   Social Network    Social Network: Not on file  Intimate Partner Violence: Unknown (02/15/2022)   Received from [facility], [facility]   HITS    Physically Hurt: Not on file    Insult or Talk Down To: Not on file    Threaten Physical Harm: Not on file    Scream or Curse: Not on file    Review of Systems 13 point review of systems per patient health survey noted.  Negative other than as indicated above or in HPI.    Objective:   Vitals:   01/26/24 0922  BP: 118/80  Pulse: (!) 56  Temp: 97.9 F (36.6 C)  TempSrc: Temporal  SpO2: 98%  Weight: 260 lb (117.9 kg)  Height: 6\' 3"  (1.905 m)     Physical Exam Vitals reviewed.  Constitutional:      Appearance: He is well-developed.  HENT:     Head: Normocephalic and atraumatic.     Right Ear: External ear normal.     Left Ear: External ear normal.  Eyes:     Conjunctiva/sclera: Conjunctivae normal.     Pupils: Pupils are equal, round, and reactive to light.  Neck:     Thyroid: No thyromegaly.  Cardiovascular:     Rate and Rhythm: Normal rate and regular rhythm.     Heart sounds: Normal heart sounds.  Pulmonary:     Effort: Pulmonary effort is normal. No respiratory distress.     Breath sounds: Normal breath sounds. No wheezing.  Abdominal:     General: There is no distension.     Palpations: Abdomen is soft.     Tenderness: There is no abdominal tenderness.  Musculoskeletal:        General: No tenderness. Normal range of motion.     Cervical back: Normal range of motion and neck supple.      Comments:  Minimal discomfort at the ulnar styloid but no crepitus, skin intact, no ecchymosis or erythema.  Full strength and range of motion intact at wrist.  Neurovascular intact distally.  Lymphadenopathy:     Cervical: No cervical adenopathy.  Skin:    General: Skin is warm and dry.  Neurological:     Mental Status: He is alert and oriented to person, place, and time.     Deep Tendon Reflexes: Reflexes are normal and symmetric.  Psychiatric:        Behavior: Behavior normal.        Assessment & Plan:  DORN HARTSHORNE is a 53 y.o. male . Annual physical exam - Plan: CBC with Differential/Platelet, Comprehensive metabolic panel, Lipid panel, PSA, Hemoglobin A1c  - -anticipatory guidance as below in AVS, screening labs above. Health maintenance items as above in HPI discussed/recommended as applicable.   Essential hypertension - Plan: CBC with Differential/Platelet, lisinopril-hydrochlorothiazide (ZESTORETIC) 20-12.5 MG tablet  -Stable with current regimen.  Continue same.  Hyperlipidemia, unspecified hyperlipidemia type - Plan: Comprehensive metabolic panel, Lipid panel  -Check labs and discussion of meds accordingly.  Hyperglycemia - Plan: Comprehensive metabolic panel, Hemoglobin A1c  -Check A1c for diabetes/prediabetes screening.  Continue exercise, watch diet.  Screening for prostate cancer - Plan: PSA  Depression, unspecified depression type - Plan: DULoxetine (CYMBALTA) 60 MG capsule  -Stable with current regimen, denies need for counseling at this time, managing stressors appropriately.  Erectile dysfunction, unspecified erectile dysfunction type - Plan: sildenafil (REVATIO) 20 MG tablet  -Stable with low-dose sildenafil, potential risks discussed. Rx given - use lowest effective dose. Side effects discussed (including but not limited to headache/flushing, blue discoloration of vision, possible vascular steal and risk of cardiac effects if underlying unknown coronary  artery disease, and permanent sensorineural hearing loss). Understanding expressed.  Gastroesophageal reflux disease, unspecified whether esophagitis present - Plan: pantoprazole (PROTONIX) 40 MG tablet, Ambulatory referral to Gastroenterology Gastric nodule - Plan: Ambulatory referral to Gastroenterology  -GERD symptoms stable, prior gastric nodule with plan for repeat EUS at 2 years from 2022, refer back to GI.  Screening for HIV (human immunodeficiency virus) - Plan: HIV antibody (with reflex)  Need for hepatitis C screening test - Plan: Hepatitis C Antibody  Wrist pain noted as above, advised to follow-up if that area does not continue to improve or affects activity/function.  Meds ordered this encounter  Medications   DULoxetine (CYMBALTA) 60 MG capsule    Sig: Take 1 capsule (60 mg total) by mouth daily.    Dispense:  90 capsule    Refill:  3    Ok to place on hold if needed.   sildenafil (REVATIO) 20 MG tablet    Sig: Take 1-2 tablets (20-40 mg total) by mouth daily as needed (prior to intercourse.).    Dispense:  30 tablet    Refill:  3   lisinopril-hydrochlorothiazide (ZESTORETIC) 20-12.5 MG tablet    Sig: Take 1 tablet by mouth daily.    Dispense:  90 tablet    Refill:  1   pantoprazole (PROTONIX) 40 MG tablet    Sig: Take 1 tablet (40 mg total) by mouth daily.    Dispense:  90 tablet    Refill:  3   Patient Instructions  No medication changes at this time.  If any concerns on labs I will let you know.  Please follow-up if wrist pain does not continue to improve in the next week or 2.  I will refer you back to  gastroenterology for follow-up endoscopy/ultrasound.  Thanks for coming in today and let me know if there are questions.  Preventive Care 66-65 Years Old, Male Preventive care refers to lifestyle choices and visits with your health care provider that can promote health and wellness. Preventive care visits are also called wellness exams. What can I expect for my  preventive care visit? Counseling During your preventive care visit, your health care provider may ask about your: Medical history, including: Past medical problems. Family medical history. Current health, including: Emotional well-being. Home life and relationship well-being. Sexual activity. Lifestyle, including: Alcohol, nicotine or tobacco, and drug use. Access to firearms. Diet, exercise, and sleep habits. Safety issues such as seatbelt and bike helmet use. Sunscreen use. Work and work Astronomer. Physical exam Your health care provider will check your: Height and weight. These may be used to calculate your BMI (body mass index). BMI is a measurement that tells if you are at a healthy weight. Waist circumference. This measures the distance around your waistline. This measurement also tells if you are at a healthy weight and may help predict your risk of certain diseases, such as type 2 diabetes and high blood pressure. Heart rate and blood pressure. Body temperature. Skin for abnormal spots. What immunizations do I need?  Vaccines are usually given at various ages, according to a schedule. Your health care provider will recommend vaccines for you based on your age, medical history, and lifestyle or other factors, such as travel or where you work. What tests do I need? Screening Your health care provider may recommend screening tests for certain conditions. This may include: Lipid and cholesterol levels. Diabetes screening. This is done by checking your blood sugar (glucose) after you have not eaten for a while (fasting). Hepatitis B test. Hepatitis C test. HIV (human immunodeficiency virus) test. STI (sexually transmitted infection) testing, if you are at risk. Lung cancer screening. Prostate cancer screening. Colorectal cancer screening. Talk with your health care provider about your test results, treatment options, and if necessary, the need for more tests. Follow  these instructions at home: Eating and drinking  Eat a diet that includes fresh fruits and vegetables, whole grains, lean protein, and low-fat dairy products. Take vitamin and mineral supplements as recommended by your health care provider. Do not drink alcohol if your health care provider tells you not to drink. If you drink alcohol: Limit how much you have to 0-2 drinks a day. Know how much alcohol is in your drink. In the [place] one drink equals one 12 oz bottle of beer (355 mL), one 5 oz glass of wine (148 mL), or one 1 oz glass of hard liquor (44 mL). Lifestyle Brush your teeth every morning and night with fluoride toothpaste. Floss one time each day. Exercise for at least 30 minutes 5 or more days each week. Do not use any products that contain nicotine or tobacco. These products include cigarettes, chewing tobacco, and vaping devices, such as e-cigarettes. If you need help quitting, ask your health care provider. Do not use drugs. If you are sexually active, practice safe sex. Use a condom or other form of protection to prevent STIs. Take aspirin only as told by your health care provider. Make sure that you understand how much to take and what form to take. Work with your health care provider to find out whether it is safe and beneficial for you to take aspirin daily. Find healthy ways to manage stress, such as: Meditation, yoga, or listening to  music. Journaling. Talking to a trusted person. Spending time with friends and family. Minimize exposure to UV radiation to reduce your risk of skin cancer. Safety Always wear your seat belt while driving or riding in a vehicle. Do not drive: If you have been drinking alcohol. Do not ride with someone who has been drinking. When you are tired or distracted. While texting. If you have been using any mind-altering substances or drugs. Wear a helmet and other protective equipment during sports activities. If you have firearms in your house,  make sure you follow all gun safety procedures. What's next? Go to your health care provider once a year for an annual wellness visit. Ask your health care provider how often you should have your eyes and teeth checked. Stay up to date on all vaccines. This information is not intended to replace advice given to you by your health care provider. Make sure you discuss any questions you have with your health care provider. Document Revised: 04/25/2021 Document Reviewed: 04/25/2021 Elsevier Patient Education  2024 Elsevier Inc.    Signed,   Meredith Staggers, MD [facility], [facility] 01/26/24 12:23 PM

## 2024-01-26 NOTE — Patient Instructions (Signed)
 No medication changes at this time.  If any concerns on labs I will let you know.  Please follow-up if wrist pain does not continue to improve in the next week or 2.  I will refer you back to gastroenterology for follow-up endoscopy/ultrasound.  Thanks for coming in today and let me know if there are questions.  Preventive Care 66-51 Years Old, Male Preventive care refers to lifestyle choices and visits with your health care provider that can promote health and wellness. Preventive care visits are also called wellness exams. What can I expect for my preventive care visit? Counseling During your preventive care visit, your health care provider may ask about your: Medical history, including: Past medical problems. Family medical history. Current health, including: Emotional well-being. Home life and relationship well-being. Sexual activity. Lifestyle, including: Alcohol, nicotine or tobacco, and drug use. Access to firearms. Diet, exercise, and sleep habits. Safety issues such as seatbelt and bike helmet use. Sunscreen use. Work and work Astronomer. Physical exam Your health care provider will check your: Height and weight. These may be used to calculate your BMI (body mass index). BMI is a measurement that tells if you are at a healthy weight. Waist circumference. This measures the distance around your waistline. This measurement also tells if you are at a healthy weight and may help predict your risk of certain diseases, such as type 2 diabetes and high blood pressure. Heart rate and blood pressure. Body temperature. Skin for abnormal spots. What immunizations do I need?  Vaccines are usually given at various ages, according to a schedule. Your health care provider will recommend vaccines for you based on your age, medical history, and lifestyle or other factors, such as travel or where you work. What tests do I need? Screening Your health care provider may recommend screening tests  for certain conditions. This may include: Lipid and cholesterol levels. Diabetes screening. This is done by checking your blood sugar (glucose) after you have not eaten for a while (fasting). Hepatitis B test. Hepatitis C test. HIV (human immunodeficiency virus) test. STI (sexually transmitted infection) testing, if you are at risk. Lung cancer screening. Prostate cancer screening. Colorectal cancer screening. Talk with your health care provider about your test results, treatment options, and if necessary, the need for more tests. Follow these instructions at home: Eating and drinking  Eat a diet that includes fresh fruits and vegetables, whole grains, lean protein, and low-fat dairy products. Take vitamin and mineral supplements as recommended by your health care provider. Do not drink alcohol if your health care provider tells you not to drink. If you drink alcohol: Limit how much you have to 0-2 drinks a day. Know how much alcohol is in your drink. In the [place] one drink equals one 12 oz bottle of beer (355 mL), one 5 oz glass of wine (148 mL), or one 1 oz glass of hard liquor (44 mL). Lifestyle Brush your teeth every morning and night with fluoride toothpaste. Floss one time each day. Exercise for at least 30 minutes 5 or more days each week. Do not use any products that contain nicotine or tobacco. These products include cigarettes, chewing tobacco, and vaping devices, such as e-cigarettes. If you need help quitting, ask your health care provider. Do not use drugs. If you are sexually active, practice safe sex. Use a condom or other form of protection to prevent STIs. Take aspirin only as told by your health care provider. Make sure that you understand how much to  take and what form to take. Work with your health care provider to find out whether it is safe and beneficial for you to take aspirin daily. Find healthy ways to manage stress, such as: Meditation, yoga, or listening to  music. Journaling. Talking to a trusted person. Spending time with friends and family. Minimize exposure to UV radiation to reduce your risk of skin cancer. Safety Always wear your seat belt while driving or riding in a vehicle. Do not drive: If you have been drinking alcohol. Do not ride with someone who has been drinking. When you are tired or distracted. While texting. If you have been using any mind-altering substances or drugs. Wear a helmet and other protective equipment during sports activities. If you have firearms in your house, make sure you follow all gun safety procedures. What's next? Go to your health care provider once a year for an annual wellness visit. Ask your health care provider how often you should have your eyes and teeth checked. Stay up to date on all vaccines. This information is not intended to replace advice given to you by your health care provider. Make sure you discuss any questions you have with your health care provider. Document Revised: 04/25/2021 Document Reviewed: 04/25/2021 Elsevier Patient Education  2024 [employer].

## 2024-01-27 ENCOUNTER — Encounter: Payer: Self-pay | Admitting: Family Medicine

## 2024-01-27 LAB — HIV ANTIBODY (ROUTINE TESTING W REFLEX): HIV 1&2 Ab, 4th Generation: NONREACTIVE

## 2024-01-27 LAB — HEPATITIS C ANTIBODY: Hepatitis C Ab: NONREACTIVE

## 2024-01-30 ENCOUNTER — Other Ambulatory Visit: Payer: Self-pay | Admitting: Family Medicine

## 2024-01-30 DIAGNOSIS — B009 Herpesviral infection, unspecified: Secondary | ICD-10-CM

## 2024-01-30 DIAGNOSIS — K219 Gastro-esophageal reflux disease without esophagitis: Secondary | ICD-10-CM

## 2024-01-30 DIAGNOSIS — I10 Essential (primary) hypertension: Secondary | ICD-10-CM

## 2024-01-30 DIAGNOSIS — F32A Depression, unspecified: Secondary | ICD-10-CM

## 2024-02-12 ENCOUNTER — Telehealth: Payer: Self-pay | Admitting: Gastroenterology

## 2024-02-12 NOTE — Telephone Encounter (Signed)
 Good morning Dr. Meridee Score,   We received a referral for this patient to be scheduled for an EUS for a gastric nodule. Patient last had EUS done with Dr. Christella Hartigan in 08/2021 and was due in 08/2023. Patient would like to discuss scheduling with you due to Dr. Christella Hartigan now being retired. Would you please advise on scheduling?  Thank you.

## 2024-02-15 NOTE — Telephone Encounter (Signed)
 This is a patient of Dr. Myrtie Neither originally. Dr. Christella Hartigan found a layer 4 lesion that recommended a 2-year follow-up upper EUS. He will remain a patient of Dr. Myrtie Neither. Can schedule nonurgent upper EUS in the next 3 months as we have available. GM

## 2024-02-16 NOTE — Telephone Encounter (Signed)
 Please advise on scheduling for EUS.

## 2024-02-17 NOTE — Telephone Encounter (Signed)
 EUS has been set up for 03/04/24 at 330 pm at [facility] with GM

## 2024-02-18 NOTE — Telephone Encounter (Signed)
 EUS scheduled, pt instructed and medications reviewed.  Patient instructions mailed to home.  Patient to call with any questions or concerns.

## 2024-02-25 ENCOUNTER — Telehealth: Payer: Self-pay | Admitting: Gastroenterology

## 2024-02-25 NOTE — Telephone Encounter (Signed)
 Procedure: Upper Endoscopic Ultrasound (EUS) Procedure date: 03/04/24 Procedure location: [facility] Arrival Time: 2:00 pm Spoke with the patient Y/N: Yes Any prep concerns? No  Has the patient obtained the prep from the pharmacy ? No prep needed Do you have a care partner and transportation: Yes, pt was concerned about having someone to stay there the entire procedure, he stated he have someone that can drop him off and pick him up. I asked Dr Willy Harvest and he stated the hospital was different from [facility] about care partners and it was ok but I told the patient that the hospital would also give him a call and he could voice his concerns with them. Any additional concerns? No

## 2024-02-26 ENCOUNTER — Encounter: Payer: Self-pay | Admitting: Family Medicine

## 2024-02-26 DIAGNOSIS — B009 Herpesviral infection, unspecified: Secondary | ICD-10-CM

## 2024-02-26 MED ORDER — VALACYCLOVIR HCL 500 MG PO TABS
500.0000 mg | ORAL_TABLET | Freq: Every day | ORAL | 3 refills | Status: AC
Start: 2024-02-26 — End: ?

## 2024-02-27 ENCOUNTER — Encounter (HOSPITAL_COMMUNITY): Payer: Self-pay | Admitting: Gastroenterology

## 2024-03-03 ENCOUNTER — Other Ambulatory Visit: Payer: Self-pay

## 2024-03-03 DIAGNOSIS — K3189 Other diseases of stomach and duodenum: Secondary | ICD-10-CM

## 2024-03-04 ENCOUNTER — Ambulatory Visit (HOSPITAL_COMMUNITY)
Admission: RE | Admit: 2024-03-04 | Discharge: 2024-03-04 | Disposition: A | Discharge status: Home / Self Care | Attending: Gastroenterology | Admitting: Gastroenterology

## 2024-03-04 ENCOUNTER — Encounter (HOSPITAL_COMMUNITY)
Admission: RE | Disposition: A | Discharge status: Home / Self Care | Payer: Self-pay | Source: Home / Self Care | Attending: Gastroenterology

## 2024-03-04 ENCOUNTER — Encounter (HOSPITAL_COMMUNITY): Payer: Self-pay | Admitting: Gastroenterology

## 2024-03-04 ENCOUNTER — Other Ambulatory Visit: Payer: Self-pay

## 2024-03-04 ENCOUNTER — Ambulatory Visit (HOSPITAL_COMMUNITY): Admitting: Anesthesiology

## 2024-03-04 ENCOUNTER — Ambulatory Visit (HOSPITAL_BASED_OUTPATIENT_CLINIC_OR_DEPARTMENT_OTHER): Admitting: Anesthesiology

## 2024-03-04 ENCOUNTER — Telehealth: Payer: Self-pay

## 2024-03-04 DIAGNOSIS — K449 Diaphragmatic hernia without obstruction or gangrene: Secondary | ICD-10-CM

## 2024-03-04 DIAGNOSIS — K209 Esophagitis, unspecified without bleeding: Secondary | ICD-10-CM

## 2024-03-04 DIAGNOSIS — K219 Gastro-esophageal reflux disease without esophagitis: Secondary | ICD-10-CM | POA: Diagnosis not present

## 2024-03-04 DIAGNOSIS — K2289 Other specified disease of esophagus: Secondary | ICD-10-CM

## 2024-03-04 DIAGNOSIS — K3189 Other diseases of stomach and duodenum: Secondary | ICD-10-CM

## 2024-03-04 DIAGNOSIS — I899 Noninfective disorder of lymphatic vessels and lymph nodes, unspecified: Secondary | ICD-10-CM

## 2024-03-04 DIAGNOSIS — Z79899 Other long term (current) drug therapy: Secondary | ICD-10-CM | POA: Diagnosis not present

## 2024-03-04 DIAGNOSIS — K2970 Gastritis, unspecified, without bleeding: Secondary | ICD-10-CM

## 2024-03-04 DIAGNOSIS — F17290 Nicotine dependence, other tobacco product, uncomplicated: Secondary | ICD-10-CM | POA: Diagnosis not present

## 2024-03-04 DIAGNOSIS — I10 Essential (primary) hypertension: Secondary | ICD-10-CM | POA: Insufficient documentation

## 2024-03-04 SURGERY — ULTRASOUND, UPPER GI TRACT, ENDOSCOPIC
Anesthesia: Monitor Anesthesia Care

## 2024-03-04 MED ORDER — GLYCOPYRROLATE PF 0.2 MG/ML IJ SOSY
PREFILLED_SYRINGE | INTRAMUSCULAR | Status: DC | PRN
  Administered 2024-03-04: .2 mg via INTRAVENOUS

## 2024-03-04 MED ORDER — PROPOFOL 500 MG/50ML IV EMUL
INTRAVENOUS | Status: DC | PRN
  Administered 2024-03-04: 30 mg via INTRAVENOUS
  Administered 2024-03-04: 200 ug/kg/min via INTRAVENOUS

## 2024-03-04 MED ORDER — SODIUM CHLORIDE 0.9 % IV SOLN: INTRAVENOUS | Status: DC

## 2024-03-04 MED ORDER — LIDOCAINE 2% (20 MG/ML) 5 ML SYRINGE
INTRAMUSCULAR | Status: DC | PRN
Start: 2024-03-04 — End: 2024-03-04
  Administered 2024-03-04: 80 mg via INTRAVENOUS

## 2024-03-04 MED ORDER — PROPOFOL 1000 MG/100ML IV EMUL
INTRAVENOUS | Status: AC
  Filled 2024-03-04: qty 100

## 2024-03-04 MED ORDER — KETAMINE HCL 10 MG/ML IJ SOLN
INTRAMUSCULAR | Status: DC | PRN
  Administered 2024-03-04: 5 mg via INTRAVENOUS

## 2024-03-04 MED ORDER — PANTOPRAZOLE SODIUM 40 MG PO TBEC
40.0000 mg | DELAYED_RELEASE_TABLET | Freq: Two times a day (BID) | ORAL | 3 refills | Status: AC
Start: 2024-03-04 — End: ?

## 2024-03-04 MED ORDER — DEXMEDETOMIDINE HCL IN NACL 80 MCG/20ML IV SOLN
INTRAVENOUS | Status: DC | PRN
  Administered 2024-03-04: 6 ug via INTRAVENOUS

## 2024-03-04 NOTE — H&P (Signed)
 GASTROENTEROLOGY PROCEDURE H&P NOTE   Primary Care Physician: Benjiman Bras, MD  HPI: Ethan Knox is a 53 y.o. male who presents for EGD/EUS to follow up a gastric fundal layer for lesion previously noted ago and monitored/surveilled.  Past Medical History:  Diagnosis Date   Depression    HSV-2 (herpes simplex virus 2) infection    Hypertension    Kidney stone    Sleep disorder, shift work    Past Surgical History:  Procedure Laterality Date   BIOPSY  06/08/2020   Procedure: BIOPSY;  Surgeon: Janel Medford, MD;  Location: [facility];  Service: Endoscopy;;   ESOPHAGOGASTRODUODENOSCOPY (EGD) WITH PROPOFOL  N/A 06/08/2020   Procedure: ESOPHAGOGASTRODUODENOSCOPY (EGD) WITH PROPOFOL ;  Surgeon: Janel Medford, MD;  Location: [facility];  Service: Endoscopy;  Laterality: N/A;   ESOPHAGOGASTRODUODENOSCOPY (EGD) WITH PROPOFOL  N/A 08/30/2021   Procedure: ESOPHAGOGASTRODUODENOSCOPY (EGD) WITH PROPOFOL ;  Surgeon: Janel Medford, MD;  Location: [facility];  Service: Endoscopy;  Laterality: N/A;   EUS N/A 06/08/2020   Procedure: UPPER ENDOSCOPIC ULTRASOUND (EUS) RADIAL;  Surgeon: Janel Medford, MD;  Location: [facility];  Service: Endoscopy;  Laterality: N/A;   EUS N/A 08/30/2021   Procedure: UPPER ENDOSCOPIC ULTRASOUND (EUS) RADIAL;  Surgeon: Janel Medford, MD;  Location: [facility];  Service: Endoscopy;  Laterality: N/A;   VASECTOMY     No current facility-administered medications for this encounter.   No current facility-administered medications for this encounter. Allergies  Allergen Reactions   Prilosec [Omeprazole Magnesium] Rash   Family History  Problem Relation Age of Onset   Diabetes Father    Arthritis Father        gout   Heart disease Father    Mental retardation Sister    Heart disease Paternal Grandmother    Colon cancer Neg Hx    Esophageal cancer Neg Hx    Pancreatic cancer Neg Hx    Stomach cancer Neg Hx    Social History    Socioeconomic History   Marital status: Married    Spouse name: Melburn Splinter   Number of children: 1   Years of education: 14   Highest education level: Not on file  Occupational History   Occupation: Emergency planning/management officer  Tobacco Use   Smoking status: Some Days    Types: Cigars   Smokeless tobacco: Never  Vaping Use   Vaping status: Never Used  Substance and Sexual Activity   Alcohol use: Yes    Comment: occ   Drug use: No   Sexual activity: Yes    Comment: vasectomy  Other Topics Concern   Not on file  Social History Narrative   Lives with his wife.  His 85 year old son lives in [place] .   Social Drivers of Corporate investment banker Strain: Not on file  Food Insecurity: Not on file  Transportation Needs: Not on file  Physical Activity: Not on file  Stress: Not on file  Social Connections: Unknown (03/26/2022)   Received from [facility], [facility]   Social Network    Social Network: Not on file  Intimate Partner Violence: Unknown (02/15/2022)   Received from [facility], [facility]   HITS    Physically Hurt: Not on file    Insult or Talk Down To: Not on file    Threaten Physical Harm: Not on file    Scream or Curse: Not on file    Physical Exam: Today's Vitals   02/27/24 1226  Weight:  117 kg   Body mass index is 32.24 kg/m. GEN: NAD EYE: Sclerae anicteric ENT: MMM CV: Non-tachycardic GI: Soft, NT/ND NEURO:  Alert & Oriented x 3  Lab Results: No results for input(s): "WBC", "HGB", "HCT", "PLT" in the last 72 hours. BMET No results for input(s): "NA", "K", "CL", "CO2", "GLUCOSE", "BUN", "CREATININE", "CALCIUM" in the last 72 hours. LFT No results for input(s): "PROT", "ALBUMIN", "AST", "ALT", "ALKPHOS", "BILITOT", "BILIDIR", "IBILI" in the last 72 hours. PT/INR No results for input(s): "LABPROT", "INR" in the last 72 hours.   Impression / Plan: This is a 52 y.o.male who presents for EGD/EUS to follow up a gastric fundal layer for lesion  previously noted ago and monitored/surveilled.  The risks of an EUS including intestinal perforation, bleeding, infection, aspiration, and medication effects were discussed as was the possibility it may not give a definitive diagnosis if a biopsy is performed.  When a biopsy of the pancreas is done as part of the EUS, there is an additional risk of pancreatitis at the rate of about 1-2%.  It was explained that procedure related pancreatitis is typically mild, although it can be severe and even life threatening, which is why we do not perform random pancreatic biopsies and only biopsy a lesion/area we feel is concerning enough to warrant the risk.   The risks and benefits of endoscopic evaluation/treatment were discussed with the patient and/or family; these include but are not limited to the risk of perforation, infection, bleeding, missed lesions, lack of diagnosis, severe illness requiring hospitalization, as well as anesthesia and sedation related illnesses.  The patient's history has been reviewed, patient examined, no change in status, and deemed stable for procedure.  The patient and/or family is agreeable to proceed.    Yong Henle, MD [facility] Advanced [facility]oscopy Office # [phone]

## 2024-03-04 NOTE — Anesthesia Postprocedure Evaluation (Signed)
 Anesthesia Post Note  Patient: Ethan Knox  Procedure(s) Performed: ULTRASOUND, UPPER GI TRACT, ENDOSCOPIC EGD (ESOPHAGOGASTRODUODENOSCOPY)     Patient location during evaluation: PACU Anesthesia Type: MAC Level of consciousness: awake and alert Pain management: pain level controlled Vital Signs Assessment: post-procedure vital signs reviewed and stable Respiratory status: spontaneous breathing, nonlabored ventilation, respiratory function stable and patient connected to nasal cannula oxygen Cardiovascular status: stable and blood pressure returned to baseline Postop Assessment: no apparent nausea or vomiting Anesthetic complications: no   No notable events documented.  Last Vitals:  Vitals:   03/04/24 1140 03/04/24 1150  BP: 112/70 113/73  Pulse: (!) 52 (!) 48  Resp: 13 14  Temp:    SpO2: 97% 99%    Last Pain:  Vitals:   03/04/24 1150  TempSrc:   PainSc: 0-No pain                 Lethaniel Rave

## 2024-03-04 NOTE — Telephone Encounter (Signed)
-----   Message from [facility] sent at 03/04/2024 11:34 AM EDT ----- Regarding: Follow-up Ethan Knox, Please recall upper EUS 2 years for later for subepithelial lesion surveillance. Thanks. GM  FYI HD Patient had some grade a esophagitis I will increase his PPI for a period of time and let you all decide if he has any other need for follow-up with you. Thanks. GM

## 2024-03-04 NOTE — Addendum Note (Signed)
 Addendum  created 03/04/24 1726 by Norvell Beers, CRNA   Intraprocedure Event edited

## 2024-03-04 NOTE — Anesthesia Preprocedure Evaluation (Signed)
 Anesthesia Evaluation  Patient identified by MRN, date of birth, ID band Patient awake    Reviewed: Allergy & Precautions, NPO status , Patient's Chart, lab work & pertinent test results  History of Anesthesia Complications Negative for: history of anesthetic complications  Airway Mallampati: II  TM Distance: >3 FB Neck ROM: Full    Dental no notable dental hx.    Pulmonary Current Smoker   Pulmonary exam normal        Cardiovascular hypertension, Pt. on medications Normal cardiovascular exam     Neuro/Psych  PSYCHIATRIC DISORDERS  Depression    negative neurological ROS     GI/Hepatic Neg liver ROS,GERD  Medicated and Controlled,,gastric nodule   Endo/Other  negative endocrine ROS    Renal/GU negative Renal ROS  negative genitourinary   Musculoskeletal negative musculoskeletal ROS (+)    Abdominal   Peds  Hematology negative hematology ROS (+)   Anesthesia Other Findings Day of surgery medications reviewed with patient.  Reproductive/Obstetrics negative OB ROS                              Anesthesia Physical Anesthesia Plan  ASA: 2  Anesthesia Plan: MAC   Post-op Pain Management:    Induction:   PONV Risk Score and Plan: Treatment may vary due to age or medical condition and Propofol  infusion  Airway Management Planned: Natural Airway and Nasal Cannula  Additional Equipment:   Intra-op Plan:   Post-operative Plan:   Informed Consent: I have reviewed the patients History and Physical, chart, labs and discussed the procedure including the risks, benefits and alternatives for the proposed anesthesia with the patient or authorized representative who has indicated his/her understanding and acceptance.       Plan Discussed with: CRNA  Anesthesia Plan Comments:          Anesthesia Quick Evaluation

## 2024-03-04 NOTE — Addendum Note (Signed)
 Addendum  created 03/04/24 1243 by Norvell Beers, CRNA   Intraprocedure Staff edited

## 2024-03-04 NOTE — Anesthesia Procedure Notes (Signed)
 Date/Time: 03/04/2024 9:11 AM  Performed by: Norvell Beers, CRNAPre-anesthesia Checklist: Patient identified, Emergency Drugs available, Suction available and Patient being monitored Oxygen Delivery Method: Simple face mask Placement Confirmation: positive ETCO2

## 2024-03-04 NOTE — Op Note (Signed)
 [facility] Patient Name: Ethan Knox Procedure Date: 03/04/2024 MRN: [id number] Attending MD: Yong Henle , MD, [id number] Date of Birth: 1971/07/27 CSN: [id number] Age: 53 Admit Type: Outpatient Procedure:                Upper EUS Indications:              Gastric deformity on endoscopy/Subepithelial tumor                            versus extrinsic compression Providers:                Yong Henle, MD, Bradley Caffey, Arlin Benes, Technician, Clemente Cushing, CRNA Referring MD:              Medicines:                Monitored Anesthesia Care Complications:            No immediate complications. Estimated Blood Loss:     Estimated blood loss was minimal. Procedure:                Pre-Anesthesia Assessment:                           - Prior to the procedure, a History and Physical                            was performed, and patient medications and                            allergies were reviewed. The patient's tolerance of                            previous anesthesia was also reviewed. The risks                            and benefits of the procedure and the sedation                            options and risks were discussed with the patient.                            All questions were answered, and informed consent                            was obtained. Prior Anticoagulants: The patient has                            taken no anticoagulant or antiplatelet agents. ASA                            Grade Assessment: II - A patient with mild systemic  disease. After reviewing the risks and benefits,                            the patient was deemed in satisfactory condition to                            undergo the procedure.                           After obtaining informed consent, the endoscope was                            passed under direct vision. Throughout the                             procedure, the patient's blood pressure, pulse, and                            oxygen saturations were monitored continuously. The                            [id number]-H190 ([id number]) Olympus endoscope was introduced                            through the mouth, and advanced to the second part                            of duodenum. The GF-[id number]-[id number] ([id number]) Olympus                            radial ultrasound scope was introduced through the                            mouth, and advanced to the stomach for ultrasound                            examination from the esophagus and stomach. The                            upper EUS was accomplished without difficulty. The                            patient tolerated the procedure. Scope In: Scope Out: Findings:      ENDOSCOPIC FINDING: :      No gross lesions were noted in the proximal esophagus and in the mid       esophagus.      LA Grade A (one or more mucosal breaks less than 5 mm, not extending       between tops of 2 mucosal folds) esophagitis with no bleeding was found       in the very distal esophagus.      The Z-line was irregular and was found 43 cm from the incisors.      A 2 cm hiatal hernia was present.      A single 9 mm  subepithelial papule (nodule) was found in the cardia.      Patchy mildly erythematous mucosa without bleeding was found in the       gastric antrum.      No other gross lesions were noted in the entire examined stomach.       Biopsies were taken with a cold forceps for histology and Helicobacter       pylori testing.      No gross lesions were noted in the duodenal bulb, in the first portion       of the duodenum and in the second portion of the duodenum.      ENDOSONOGRAPHIC FINDING: :      A round intramural (subepithelial) lesion was found in the cardia of the       stomach (lesser curve). The lesion was hypoechoic. Sonographically, the       lesion appeared to originate from the muscularis  propria (Layer 4). The       lesion also measured 8.7 mm by 5.1 mm in diameter. The outer       endosonographic borders were smooth.      Endosonographic imaging in the rest of the visualized stomach showed no       wall thickening.      Endosonographic imaging in the visualized portion of the liver showed no       mass.      No malignant-appearing lymph nodes were visualized in the left gastric       region (level 17), gastrohepatic ligament (level 18) and celiac region       (level 20).      The celiac region was visualized. Impression:               EGD impression:                           - No gross lesions in the proximal esophagus and in                            the mid esophagus.                           - LA Grade A esophagitis with no bleeding found in                            very distal esophagus.                           - Z-line irregular, 43 cm from the incisors.                           - 2 cm hiatal hernia.                           - Small gastric nodule noted in the cardia (lesser                            curve).                           - Erythematous mucosa in the antrum. No other  gross                            lesions in the entire stomach. Biopsied.                           - No gross lesions in the duodenal bulb, in the                            first portion of the duodenum and in the second                            portion of the duodenum.                           EUS impression:                           - An intramural (subepithelial) lesion was found in                            the cardia of the stomach. The lesion appeared to                            originate from within the muscularis propria (Layer                            4). Tissue has not been obtained. 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                            - No malignant-appearing lymph nodes were                            visualized in the left gastric region (level 17),                            gastrohepatic ligament (level 18) and celiac region                            (level 20). Moderate Sedation:      Not Applicable - Patient had care per Anesthesia. Recommendation:           - The patient will be observed post-procedure,                            until all discharge criteria are met.                           - Discharge patient to home.                           -  Patient has a contact number available for                            emergencies. The signs and symptoms of potential                            delayed complications were discussed with the                            patient. Return to normal activities tomorrow.                            Written discharge instructions were provided to the                            patient.                           - Resume previous diet.                           - Observe patient's clinical course.                           - Await path results.                           - Increase to PPI twice daily for 2 months then may                            decrease back to once daily if not having                            significant esophagitis.                           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 Can repeat another upper EUS in 2                            years and if this remains stable in size, consider                            pushing it out further at that point.                           - Follow-up with Dr. Dominic Friendly as needed.                           - The findings and  recommendations were discussed                            with the patient.                           - The findings and recommendations were discussed                             with the designated responsible adult. Procedure Code(s):        --- Professional ---                           [phone], Esophagogastroduodenoscopy, flexible,                            transoral; with endoscopic ultrasound examination                            limited to the esophagus, stomach or duodenum, and                            adjacent structures                           43239, Esophagogastroduodenoscopy, flexible,                            transoral; with biopsy, single or multiple Diagnosis Code(s):        --- Professional ---                           K20.90, Esophagitis, unspecified without bleeding                           K22.89, Other specified disease of esophagus                           K44.9, Diaphragmatic hernia without obstruction or                            gangrene                           K31.89, Other diseases of stomach and duodenum                           I89.9, Noninfective disorder of lymphatic vessels                            and lymph nodes, unspecified CPT copyright 2022 American Medical Association. All rights reserved. The codes documented in this report are preliminary and upon coder review may  be revised to meet current compliance requirements. Yong Henle, MD 03/04/2024 11:31:26 AM Number of Addenda: 0

## 2024-03-04 NOTE — Transfer of Care (Signed)
 Immediate Anesthesia Transfer of Care Note  Patient: Ethan Knox  Procedure(s) Performed: ULTRASOUND, UPPER GI TRACT, ENDOSCOPIC EGD (ESOPHAGOGASTRODUODENOSCOPY)  Patient Location: PACU and Endoscopy Unit  Anesthesia Type:MAC  Level of Consciousness: drowsy  Airway & Oxygen Therapy: Patient Spontanous Breathing and Patient connected to face mask oxygen  Post-op Assessment: Report given to RN and Post -op Vital signs reviewed and stable  Post vital signs: Reviewed and stable  Last Vitals:  Vitals Value Taken Time  BP    Temp    Pulse    Resp    SpO2      Last Pain:  Vitals:   03/04/24 0911  TempSrc: Temporal  PainSc: 0-No pain         Complications: No notable events documented.

## 2024-03-04 NOTE — Telephone Encounter (Signed)
Recall has been entered as ordered  

## 2024-03-04 NOTE — Addendum Note (Signed)
 Addendum  created 03/04/24 1242 by Norvell Beers, CRNA   Intraprocedure Event deleted, Intraprocedure Event edited

## 2024-03-04 NOTE — Discharge Instructions (Signed)

## 2024-03-05 LAB — SURGICAL PATHOLOGY

## 2024-03-05 NOTE — Addendum Note (Signed)
 Addendum  created 03/05/24 1511 by Lethaniel Rave, MD   Intraprocedure Staff edited

## 2024-03-06 ENCOUNTER — Encounter: Payer: Self-pay | Admitting: Gastroenterology

## 2024-03-07 ENCOUNTER — Encounter (HOSPITAL_COMMUNITY): Payer: Self-pay | Admitting: Gastroenterology

## 2024-03-17 ENCOUNTER — Telehealth: Payer: Self-pay

## 2024-03-17 NOTE — Telephone Encounter (Signed)
 Can one of you please set this pt up for appt with Dr Dominic Friendly next available?

## 2024-03-17 NOTE — Telephone Encounter (Signed)
 Noted.

## 2024-03-17 NOTE — Telephone Encounter (Signed)
-----   Message from Kerby Pearson III sent at 03/11/2024 11:22 PM EDT ----- Please offer this man a follow-up clinic visit with me in the next few months since Dr. Brice Campi increased his PPI.  I saw him in 2021 for reflux and he subsequently saw Dr. Karene Oto for consideration of TIF but seems to have decided against doing that at the time.  - H. Danis

## 2024-05-27 ENCOUNTER — Ambulatory Visit: Admitting: Gastroenterology

## 2024-05-27 ENCOUNTER — Encounter: Payer: Self-pay | Admitting: Gastroenterology

## 2024-05-27 VITALS — BP 122/70 | HR 68 | Ht 75.0 in | Wt 281.0 lb

## 2024-05-27 DIAGNOSIS — K2100 Gastro-esophageal reflux disease with esophagitis, without bleeding: Secondary | ICD-10-CM

## 2024-05-27 DIAGNOSIS — K3189 Other diseases of stomach and duodenum: Secondary | ICD-10-CM

## 2024-05-27 NOTE — Progress Notes (Signed)
 [facility] Progress Note  Chief Complaint: GERD with esophagitis  Subjective  Prior history  Longstanding GERD, erosive esophagitis on EGD June 2021. Incidental gastric submucosal nodule at that time, evaluated by EUS (Dr. 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 Saw Dr. San November 2021 for consideration of TIF -that note indicates plans to check insurance authorization.  Pantoprazole  was started  March 2025 referral for follow-up of gastric nodule -Dr. Wilhelmenia did EUS on 03/04/2024.  Lesion stable size, he had same clinical impression and recommended EUS in 2 years.  Grade a reflux esophagitis seen as well  Discussed the use of AI scribe software for clinical note transcription with the patient, who gave verbal consent to proceed.  History of Present Illness  Ethan Knox was here today to follow-up on his reflux.  He was on pantoprazole  40 mg once daily with good control of heartburn and feelings of regurgitation and without dysphagia leading up to his EUS.  He was reassured that the gastric nodule was stable.  Dr. Wilhelmenia increased his PPI to twice daily which he did until recently when he had to drop down to once daily to have sufficient supply.  He really did not notice any change in symptoms 1 where the other on twice daily versus once daily. Denies abdominal pain.  He rarely has regurgitation and denies any nocturnal reflux symptoms. 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   ROS: Cardiovascular:  no chest pain Respiratory: no dyspnea  The patient's Past Medical, Family and  Social History were reviewed and are on file in the EMR. Past Medical History:  Diagnosis Date   Depression    HSV-2 (herpes simplex virus 2) infection    Hypertension    Kidney stone    Sleep disorder, shift work     Past Surgical History:  Procedure Laterality Date   BIOPSY  06/08/2020   Procedure: BIOPSY;  Surgeon: Teressa Toribio SQUIBB, MD;  Location: [facility];  Service: Endoscopy;;   ESOPHAGOGASTRODUODENOSCOPY N/A 03/04/2024   Procedure: EGD (ESOPHAGOGASTRODUODENOSCOPY);  Surgeon: Wilhelmenia Aloha Raddle., MD;  Location: [facility];  Service: Gastroenterology;  Laterality: N/A;   ESOPHAGOGASTRODUODENOSCOPY (EGD) WITH PROPOFOL  N/A 06/08/2020   Procedure: ESOPHAGOGASTRODUODENOSCOPY (EGD) WITH PROPOFOL ;  Surgeon: Teressa Toribio SQUIBB, MD;  Location: [facility];  Service: Endoscopy;  Laterality: N/A;   ESOPHAGOGASTRODUODENOSCOPY (EGD) WITH PROPOFOL  N/A 08/30/2021   Procedure: ESOPHAGOGASTRODUODENOSCOPY (EGD) WITH PROPOFOL ;  Surgeon: Teressa Toribio SQUIBB, MD;  Location: [facility];  Service: Endoscopy;  Laterality: N/A;   EUS N/A 06/08/2020   Procedure: UPPER ENDOSCOPIC ULTRASOUND (EUS) RADIAL;  Surgeon: Teressa Toribio SQUIBB, MD;  Location: [facility];  Service: Endoscopy;  Laterality: N/A;   EUS N/A 08/30/2021   Procedure: UPPER ENDOSCOPIC ULTRASOUND (EUS) RADIAL;  Surgeon: Teressa Toribio SQUIBB, MD;  Location: [facility];  Service: Endoscopy;  Laterality: N/A;   EUS N/A 03/04/2024   Procedure: ULTRASOUND, UPPER GI TRACT, ENDOSCOPIC;  Surgeon: Wilhelmenia Aloha Raddle., MD;  Location: [facility];  Service: Gastroenterology;  Laterality: N/A;   VASECTOMY       Objective:  Med list reviewed  Current Outpatient Medications:  cetirizine (ZYRTEC) 10 MG tablet, Take 10 mg by mouth daily., Disp: , Rfl:    DULoxetine  (CYMBALTA ) 60 MG capsule, Take 1 capsule (60 mg total) by mouth daily., Disp: 90 capsule, Rfl: 3   fluticasone  (FLONASE ) 50 MCG/ACT nasal spray, Place 1 spray into both nostrils daily  as needed for allergies or rhinitis., Disp: , Rfl:    lisinopril -hydrochlorothiazide  (ZESTORETIC ) 20-12.5 MG tablet, Take 1 tablet by mouth daily., Disp: 90 tablet, Rfl: 1   pantoprazole  (PROTONIX ) 40 MG tablet, Take 1 tablet (40 mg total) by mouth 2 (two) times daily before a meal., Disp: 90 tablet, Rfl: 3   sildenafil  (REVATIO ) 20 MG tablet, Take 1-2 tablets (20-40 mg total) by mouth daily as needed (prior to intercourse.)., Disp: 30 tablet, Rfl: 3   valACYclovir  (VALTREX ) 500 MG tablet, Take 1 tablet (500 mg total) by mouth daily., Disp: 90 tablet, Rfl: 3   Vital signs in last 24 hrs: Vitals:   05/27/24 0817  BP: 122/70  Pulse: 68   Wt Readings from Last 3 Encounters:  05/27/24 281 lb (127.5 kg)  03/04/24 260 lb (117.9 kg)  01/26/24 260 lb (117.9 kg)    Physical Exam   Well-appearing and normal vocal quality Cardiac: Regular without appreciable murmur,  no peripheral edema Unable to do pulmonary abdominal exam with his vest and other equipment on today   Labs:   ___________________________________________ Radiologic studies:   ____________________________________________ Other:   _____________________________________________   Encounter Diagnoses  Name Primary?   Gastroesophageal reflux disease with esophagitis without hemorrhage Yes   Gastric nodule    Assessment & Plan  Stable benign gastric nodule, advanced endoscopist recommended EUS in about 2 years.  If stable at that point likely space out the surveillance.  GERD with previously discovered esophagitis.  He has much better symptom control on PPI than he did on H2 blocker and he tells me today that while he did want to proceed with the TIF his insurance would not approve it.  I cannot see any clear details of those communications from insurance at this time to know what the rationale of that denial was.  Ethan Knox says he is fine taking medicines for now since it seems to be controlling things but he still might  like to reconsider TIF if it was feasible. So I will communicate with Dr. San and see what he might be able to glean from the 2021 records about that and whether there would be any utility in considering that again.   20 minutes were spent on this encounter (including chart review, history/exam, counseling/coordination of care, and documentation) > 50% of that time was spent on counseling and coordination of care.   Victory LITTIE Brand III

## 2024-05-27 NOTE — Patient Instructions (Signed)
 _______________________________________________________  If your blood pressure at your visit was 140/90 or greater, please contact your primary care physician to follow up on this.  _______________________________________________________  If you are age 51 or older, your body mass index should be between 23-30. Your Body mass index is 35.12 kg/m. If this is out of the aforementioned range listed, please consider follow up with your Primary Care Provider.  If you are age 52 or younger, your body mass index should be between 19-25. Your Body mass index is 35.12 kg/m. If this is out of the aformentioned range listed, please consider follow up with your Primary Care Provider.   ________________________________________________________  The [facility] providers would like to encourage you to use MYCHART to communicate with providers for non-urgent requests or questions.  Due to long hold times on the telephone, sending your provider a message by [facility] may be a faster and more efficient way to get a response.  Please allow 48 business hours for a response.  Please remember that this is for non-urgent requests.  _______________________________________________________

## 2024-05-30 ENCOUNTER — Encounter: Payer: Self-pay | Admitting: Gastroenterology

## 2024-06-10 NOTE — Telephone Encounter (Signed)
 This patient would like to have another visit with Dr. San to discuss the possibility of a TIF for his GERD. Please help arrange  Thank you  H Danis

## 2024-08-02 ENCOUNTER — Encounter: Payer: Self-pay | Admitting: Family Medicine

## 2024-08-02 ENCOUNTER — Ambulatory Visit: Admitting: Family Medicine

## 2024-08-02 VITALS — BP 118/70 | HR 53 | Temp 98.0°F | Resp 16 | Ht 75.0 in | Wt 264.0 lb

## 2024-08-02 DIAGNOSIS — F32A Depression, unspecified: Secondary | ICD-10-CM

## 2024-08-02 DIAGNOSIS — I10 Essential (primary) hypertension: Secondary | ICD-10-CM | POA: Diagnosis not present

## 2024-08-02 DIAGNOSIS — Z23 Encounter for immunization: Secondary | ICD-10-CM | POA: Diagnosis not present

## 2024-08-02 DIAGNOSIS — N529 Male erectile dysfunction, unspecified: Secondary | ICD-10-CM | POA: Diagnosis not present

## 2024-08-02 DIAGNOSIS — R221 Localized swelling, mass and lump, neck: Secondary | ICD-10-CM

## 2024-08-02 DIAGNOSIS — R0683 Snoring: Secondary | ICD-10-CM

## 2024-08-02 DIAGNOSIS — E785 Hyperlipidemia, unspecified: Secondary | ICD-10-CM | POA: Diagnosis not present

## 2024-08-02 DIAGNOSIS — R400 Somnolence: Secondary | ICD-10-CM

## 2024-08-02 LAB — CBC
HCT: 44.7 % (ref 39.0–52.0)
Hemoglobin: 15.7 g/dL (ref 13.0–17.0)
MCHC: 35.1 g/dL (ref 30.0–36.0)
MCV: 94.1 fl (ref 78.0–100.0)
Platelets: 291 K/uL (ref 150.0–400.0)
RBC: 4.75 Mil/uL (ref 4.22–5.81)
RDW: 12.7 % (ref 11.5–15.5)
WBC: 7.4 K/uL (ref 4.0–10.5)

## 2024-08-02 LAB — LIPID PANEL
Cholesterol: 194 mg/dL (ref 0–200)
HDL: 46.3 mg/dL (ref >39.00)
LDL Cholesterol: 116 mg/dL — ABNORMAL HIGH (ref 0–99)
NonHDL: 147.39
Total CHOL/HDL Ratio: 4
Triglycerides: 159 mg/dL — ABNORMAL HIGH (ref 0.0–149.0)
VLDL: 31.8 mg/dL (ref 0.0–40.0)

## 2024-08-02 LAB — COMPREHENSIVE METABOLIC PANEL WITH GFR
ALT: 36 U/L (ref 0–53)
AST: 25 U/L (ref 0–37)
Albumin: 4.8 g/dL (ref 3.5–5.2)
Alkaline Phosphatase: 53 U/L (ref 39–117)
BUN: 22 mg/dL (ref 6–23)
CO2: 27 meq/L (ref 19–32)
Calcium: 9.8 mg/dL (ref 8.4–10.5)
Chloride: 101 meq/L (ref 96–112)
Creatinine, Ser: 1.12 mg/dL (ref 0.40–1.50)
GFR: 76.18 mL/min (ref >60.00)
Glucose, Bld: 92 mg/dL (ref 70–99)
Potassium: 3.7 meq/L (ref 3.5–5.1)
Sodium: 136 meq/L (ref 135–145)
Total Bilirubin: 0.7 mg/dL (ref 0.2–1.2)
Total Protein: 7.6 g/dL (ref 6.0–8.3)

## 2024-08-02 MED ORDER — LISINOPRIL-HYDROCHLOROTHIAZIDE 20-12.5 MG PO TABS: 1.0000 | ORAL_TABLET | Freq: Every day | ORAL | 1 refills | Status: AC

## 2024-08-02 NOTE — Progress Notes (Signed)
 Subjective:  Patient ID: Ethan Knox, male    DOB: [birth year]  Age: 53 y.o. MRN: [id number]  CC:  Chief Complaint  Patient presents with   Medical Management of Chronic Issues    Patient is doing well, had endoscopy recently and was advised to request sleep study due to snoring during procedure    Mass    Lt shoulder neck area has a lump that presented more than 6 months ago, notes no pain, no heat from the area. Has not increased in size in 6 months     HPI Ethan Knox presents for   Chronic conditions and acute concern above.   Left shoulder/neck lump: Wife noticed lump - about 6 months or so. Present for at least 6 months without any significant change in size.  No pain. No treatment. No hx of lipomas.  No other areas of swelling, or notable lymph node swelling.  No fever/night sweats or unexplained wt loss.   GERD/esophagitis: Treated by Dr. Legrand - looking into surgical procedure. On protonix .   Hypertension: Treated with lisinopril  HCTZ 20/12.5 mg daily without any side effects.  As above he did have a recent endoscopy, snoring was witnessed, recommended sleep study.  Snoring noted by spouse as well. Daytime somnolence only with decreased sleep with work.  Home readings: BP Readings from Last 3 Encounters:  08/02/24 118/70  05/27/24 122/70  03/04/24 113/73   Lab Results  Component Value Date   CREATININE 0.96 01/26/2024   Lab Results  Component Value Date   HGBA1C 5.4 01/26/2024   Erectile dysfunction Treated with low-dose sildenafil  20mg , denies new hearing, vision changes or chest pain/dyspnea exertion.  No new side effects.  Some flushing with higher doses, sometimes HA at lower dose - rare. Effective at current dosing.   Hyperlipidemia: Mild elevation of LDL previously with borderline ASCVD risk score as above.  No current statin. The 10-year ASCVD risk score (Arnett DK, et al., 2019) is: 8.3%   Values used to calculate the score:     Age:  53 years     Clincally relevant sex: Male     Is Non-Hispanic African American: No     Diabetic: No     Tobacco smoker: Yes     Systolic Blood Pressure: 118 mmHg     Is BP treated: Yes     HDL Cholesterol: 48 mg/dL     Total Cholesterol: 194 mg/dL Lab Results  Component Value Date   CHOL 194 01/26/2024   HDL 48.00 01/26/2024   LDLCALC 116 (H) 01/26/2024   TRIG 149.0 01/26/2024   CHOLHDL 4 01/26/2024   Lab Results  Component Value Date   ALT 31 01/26/2024   AST 21 01/26/2024   ALKPHOS 59 01/26/2024   BILITOT 0.6 01/26/2024   Depression: Cymbalta  60 mg daily.  Rare use of Ambien  previously with shiftwork sleep disorder.  Coping techniques for stress include biking, spending time with family.  Stable at his March visit and declined need for counseling at that time. Still working well/ doing well - no changes requested.        08/02/2024    9:36 AM 01/26/2024    9:25 AM 07/28/2023    9:32 AM 01/10/2023    8:02 AM 06/17/2022   10:42 AM  Depression screen PHQ 2/9  Decreased Interest 0 0 0 0 0  Down, Depressed, Hopeless 0 0 0 0 0  PHQ - 2 Score 0 0 0 0 0  Altered  sleeping 2 0 1 0 1  Tired, decreased energy 1 0 1 0 1  Change in appetite 1 0 0 0 0  Feeling bad or failure about yourself  0 0 0 0 0  Trouble concentrating 0 0 1 0 0  Moving slowly or fidgety/restless 2 0 1 0 0  Suicidal thoughts 0 0 0 0 0  PHQ-9 Score 6 0 4 0 2  Difficult doing work/chores Not difficult at all  Not difficult at all      History Patient Active Problem List   Diagnosis Date Noted   Gastritis without bleeding 03/04/2024   Gastroesophageal reflux disease 03/04/2024   Depression 01/10/2023   Gastric nodule    Heart murmur 01/11/2020   Essential hypertension 12/14/2019   Visit for suture removal 12/14/2019   HSV-2 (herpes simplex virus 2) infection    Sleep disorder, shift work    Past Medical History:  Diagnosis Date   Depression    HSV-2 (herpes simplex virus 2) infection    Hypertension     Kidney stone    Sleep disorder, shift work    Past Surgical History:  Procedure Laterality Date   BIOPSY  06/08/2020   Procedure: BIOPSY;  Surgeon: Teressa Toribio SQUIBB, MD;  Location: [facility];  Service: Endoscopy;;   ESOPHAGOGASTRODUODENOSCOPY N/A 03/04/2024   Procedure: EGD (ESOPHAGOGASTRODUODENOSCOPY);  Surgeon: Wilhelmenia Aloha Raddle., MD;  Location: [facility];  Service: Gastroenterology;  Laterality: N/A;   ESOPHAGOGASTRODUODENOSCOPY (EGD) WITH PROPOFOL  N/A 06/08/2020   Procedure: ESOPHAGOGASTRODUODENOSCOPY (EGD) WITH PROPOFOL ;  Surgeon: Teressa Toribio SQUIBB, MD;  Location: [facility];  Service: Endoscopy;  Laterality: N/A;   ESOPHAGOGASTRODUODENOSCOPY (EGD) WITH PROPOFOL  N/A 08/30/2021   Procedure: ESOPHAGOGASTRODUODENOSCOPY (EGD) WITH PROPOFOL ;  Surgeon: Teressa Toribio SQUIBB, MD;  Location: [facility];  Service: Endoscopy;  Laterality: N/A;   EUS N/A 06/08/2020   Procedure: UPPER ENDOSCOPIC ULTRASOUND (EUS) RADIAL;  Surgeon: Teressa Toribio SQUIBB, MD;  Location: [facility];  Service: Endoscopy;  Laterality: N/A;   EUS N/A 08/30/2021   Procedure: UPPER ENDOSCOPIC ULTRASOUND (EUS) RADIAL;  Surgeon: Teressa Toribio SQUIBB, MD;  Location: [facility];  Service: Endoscopy;  Laterality: N/A;   EUS N/A 03/04/2024   Procedure: ULTRASOUND, UPPER GI TRACT, ENDOSCOPIC;  Surgeon: Wilhelmenia Aloha Raddle., MD;  Location: [facility];  Service: Gastroenterology;  Laterality: N/A;   VASECTOMY     Allergies  Allergen Reactions   Prilosec [Omeprazole Magnesium] Rash   Prior to Admission medications   Medication Sig Start Date End Date Taking? Authorizing Provider  cetirizine (ZYRTEC) 10 MG tablet Take 10 mg by mouth daily.   Yes [provider]  DULoxetine  (CYMBALTA ) 60 MG capsule Take 1 capsule (60 mg total) by mouth daily. 01/26/24  Yes Levora Reyes SAUNDERS, MD  fluticasone  (FLONASE ) 50 MCG/ACT nasal spray Place 1 spray into both nostrils daily as needed for allergies or rhinitis.   Yes [provider]  lisinopril -hydrochlorothiazide  (ZESTORETIC ) 20-12.5 MG tablet Take 1 tablet by mouth daily. 01/26/24  Yes Levora Reyes SAUNDERS, MD  pantoprazole  (PROTONIX ) 40 MG tablet Take 1 tablet (40 mg total) by mouth 2 (two) times daily before a meal. 03/04/24  Yes Mansouraty, Aloha Raddle., MD  sildenafil  (REVATIO ) 20 MG tablet Take 1-2 tablets (20-40 mg total) by mouth daily as needed (prior to intercourse.). 01/26/24  Yes Levora Reyes SAUNDERS, MD  valACYclovir  (VALTREX ) 500 MG tablet Take 1 tablet (500 mg total) by mouth daily. 02/26/24  Yes Levora Reyes SAUNDERS, MD   Social History  Socioeconomic History   Marital status: Married    Spouse name: Bernice Seton   Number of children: 1   Years of education: 14   Highest education level: Associate degree: academic program  Occupational History   Occupation: Emergency planning/management officer  Tobacco Use   Smoking status: Some Days    Types: Cigars   Smokeless tobacco: Never  Vaping Use   Vaping status: Never Used  Substance and Sexual Activity   Alcohol use: Yes    Comment: occ   Drug use: No   Sexual activity: Yes    Comment: vasectomy  Other Topics Concern   Not on file  Social History Narrative   Lives with his wife.  His 61 year old son lives in [place] .   Social Drivers of Corporate investment banker Strain: Low Risk  (08/01/2024)   Overall Financial Resource Strain (CARDIA)    Difficulty of Paying Living Expenses: Not hard at all  Food Insecurity: No Food Insecurity (08/01/2024)   Hunger Vital Sign    Worried About Running Out of Food in the Last Year: Never true    Ran Out of Food in the Last Year: Never true  Transportation Needs: No Transportation Needs (08/01/2024)   PRAPARE - Administrator, Civil Service (Medical): No    Lack of Transportation (Non-Medical): No  Physical Activity: Insufficiently Active (08/01/2024)   Exercise Vital Sign    Days of Exercise per Week: 6 days    Minutes of Exercise per Session: 20 min  Stress:  Stress Concern Present (08/01/2024)   [employer] of Occupational Health - Occupational Stress Questionnaire    Feeling of Stress: To some extent  Social Connections: Moderately Integrated (08/01/2024)   Social Connection and Isolation Panel    Frequency of Communication with Friends and Family: More than three times a week    Frequency of Social Gatherings with Friends and Family: Never    Attends Religious Services: Never    Database administrator or Organizations: Yes    Attends Banker Meetings: Never    Marital Status: Married  Catering manager Violence: Unknown (02/15/2022)   Received from [facility]   HITS    Physically Hurt: Not on file    Insult or Talk Down To: Not on file    Threaten Physical Harm: Not on file    Scream or Curse: Not on file    Review of Systems  Constitutional:  Negative for fatigue and unexpected weight change.  Eyes:  Negative for visual disturbance.  Respiratory:  Negative for cough, chest tightness and shortness of breath.   Cardiovascular:  Negative for chest pain, palpitations and leg swelling.  Gastrointestinal:  Negative for abdominal pain and blood in stool.  Neurological:  Negative for dizziness, light-headedness and headaches.     Objective:   Vitals:   08/02/24 0936  BP: 118/70  Pulse: (!) 53  Resp: 16  Temp: 98 F (36.7 C)  TempSrc: Temporal  SpO2: 97%  Weight: 264 lb (119.7 kg)  Height: 6' 3 (1.905 m)     Physical Exam Vitals reviewed.  Constitutional:      Appearance: He is well-developed.  HENT:     Head: Normocephalic and atraumatic.  Neck:     Vascular: No carotid bruit or JVD.     Comments: Lateral neck, supraclavicular area with approximately 1 cm soft mobile mass.  Nontender.  No surrounding lymphadenopathy.  No appreciable abnormal lymph nodes of the neck.  Skin intact without erythema or induration. Cardiovascular:     Rate and Rhythm: Normal rate and regular rhythm.     Heart sounds:  Normal heart sounds. No murmur heard. Pulmonary:     Effort: Pulmonary effort is normal.     Breath sounds: Normal breath sounds. No rales.  Musculoskeletal:     Right lower leg: No edema.     Left lower leg: No edema.  Skin:    General: Skin is warm and dry.  Neurological:     Mental Status: He is alert and oriented to person, place, and time.  Psychiatric:        Mood and Affect: Mood normal.        Assessment & Plan:  Ethan Knox is a 53 y.o. male . Essential hypertension - Plan: lisinopril -hydrochlorothiazide  (ZESTORETIC ) 20-12.5 MG tablet, Comprehensive metabolic panel with GFR, CBC  -  Stable, tolerating current regimen. Medications refilled. Labs pending as above.   Need for influenza vaccination - Plan: Flu vaccine trivalent PF, 6mos and older(Flulaval,Afluria,Fluarix,Fluzone)  Depression, unspecified depression type  -stable. No med changes.   Hyperlipidemia, unspecified hyperlipidemia type - Plan: Comprehensive metabolic panel with GFR, Lipid panel  - Check labs, ASCVD risk score, adjust plan accordingly.  Erectile dysfunction, unspecified erectile dysfunction type Stable with low-dose sildenafil .  No changes.  Daytime somnolence - Plan: Ambulatory referral to Pulmonology Snoring - Plan: Ambulatory referral to Pulmonology  - Suspicious for underlying sleep apnea.  Referred for testing.  Localized swelling, mass and lump, neck - Plan: CBC, US  CHEST SOFT TISSUE, CANCELED: US  Soft Tissue Head/Neck (NON-THYROID )  - Suspected lipoma.  Will check ultrasound.  Further workup depending on results.  Meds ordered this encounter  Medications   lisinopril -hydrochlorothiazide  (ZESTORETIC ) 20-12.5 MG tablet    Sig: Take 1 tablet by mouth daily.    Dispense:  90 tablet    Refill:  1   Patient Instructions  Thanks for coming in today.  No medication changes at this time.  I will check cholesterol levels again.  Depending on those results we can decide if other  testing or medication indicated.  Continue to stay active.  I will refer you to pulmonary for sleep study, sleep apnea screening.  I also ordered an ultrasound of the area on the left neck.  I suspect that is a lipoma or benign collection of fat cells under the skin but will wait on the ultrasound results first.  Let me know if any changes.  Take care, stay safe.    Signed,   Reyes Pines, MD [facility], [facility] 08/02/24 10:27 AM

## 2024-08-02 NOTE — Patient Instructions (Signed)
 Thanks for coming in today.  No medication changes at this time.  I will check cholesterol levels again.  Depending on those results we can decide if other testing or medication indicated.  Continue to stay active.  I will refer you to pulmonary for sleep study, sleep apnea screening.  I also ordered an ultrasound of the area on the left neck.  I suspect that is a lipoma or benign collection of fat cells under the skin but will wait on the ultrasound results first.  Let me know if any changes.  Take care, stay safe.

## 2024-08-03 ENCOUNTER — Other Ambulatory Visit: Payer: Self-pay | Admitting: Family Medicine

## 2024-08-03 DIAGNOSIS — I10 Essential (primary) hypertension: Secondary | ICD-10-CM

## 2024-08-05 ENCOUNTER — Ambulatory Visit: Admitting: Gastroenterology

## 2024-08-06 ENCOUNTER — Ambulatory Visit: Payer: Self-pay | Admitting: Family Medicine

## 2024-08-13 ENCOUNTER — Ambulatory Visit
Admission: RE | Admit: 2024-08-13 | Discharge: 2024-08-13 | Disposition: A | Discharge status: Home / Self Care | Source: Ambulatory Visit | Attending: Family Medicine | Admitting: Family Medicine

## 2024-08-13 DIAGNOSIS — R221 Localized swelling, mass and lump, neck: Secondary | ICD-10-CM

## 2024-08-17 ENCOUNTER — Ambulatory Visit: Admitting: Gastroenterology

## 2024-08-24 ENCOUNTER — Ambulatory Visit: Admitting: Gastroenterology

## 2024-09-07 ENCOUNTER — Encounter: Payer: Self-pay | Admitting: Gastroenterology

## 2024-09-07 ENCOUNTER — Ambulatory Visit: Admitting: Gastroenterology

## 2024-09-07 VITALS — BP 130/80 | HR 55 | Ht 75.0 in | Wt 253.0 lb

## 2024-09-07 DIAGNOSIS — K449 Diaphragmatic hernia without obstruction or gangrene: Secondary | ICD-10-CM

## 2024-09-07 DIAGNOSIS — K2100 Gastro-esophageal reflux disease with esophagitis, without bleeding: Secondary | ICD-10-CM

## 2024-09-07 DIAGNOSIS — D214 Benign neoplasm of connective and other soft tissue of abdomen: Secondary | ICD-10-CM

## 2024-09-07 DIAGNOSIS — K3189 Other diseases of stomach and duodenum: Secondary | ICD-10-CM

## 2024-09-07 NOTE — Progress Notes (Signed)
 Chief Complaint: GERD   Referring Provider:     Dr. 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  Longstanding history of GERD for 15+ years complicated by erosive esophagitis.  Was previously evaluated by me in 2021, but insurance did not approve TIF, and presents today to reevaluate for antireflux surgical options.  Controlled with Protonix  40 mg daily. Rare breakthrough sxs.  However, he is interested in antireflux surgical options as a means to stop or significantly reduce need for PPI therapy.  GERD history: -Index symptoms: Heartburn, regurgitation, chronic cough -Exacerbating features: Pizza, coffee, tomato based sauces, greasy foods  -Medications trialed: Prilosec (stopped due to rash), pantoprazole , Zantac, famotidine -Current medications: Protoinix 40 mg daily -Complications: Erosive esophagitis  GERD evaluation: -Last EGD: 02/2024 (EUS) -Barium esophagram: N/A -Esophageal Manometry: N/A -pH/Impedance: N/A -Bravo: N/A  Endoscopic History: -EGD (05/01/2020, Dr. Legrand): LA Grade A esophagitis, 6 mm submucosal nodule and lesser curve of stomach -EUS (06/08/2020, Dr. 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  Recommended repeat EUS in 1 year - EUS (08/30/2021): 8.6 x 5.1 mm gastric subepithelial lesion unchanged or perhaps a bit smaller compared with previous and likely small GIST or leiomyoma.  Repeat in 2 years - EUS (03/04/2024): LA Grade D erosive esophagitis, 2 cm hiatal hernia, 8.7 x 5.1 mm subepithelial lesion in the gastric cardia, normal lymph nodes.  Repeat EUS in 2 years and if stable liberalize surveillance intervals   GERD-HRQL Questionnaire Score: 10/50 (on acid  suppression therapy) neutral with satisfaction regarding current health condition related to reflux   Separately, history of gastric nodule.  Underwent EUS on 03/04/2024; lesion stable in size with recommendation to repeat EUS in 2 years for continued surveillance.  LA Grade A erosive esophagitis noted at that time as well.   Past Medical History:  Diagnosis Date   Depression    HSV-2 (herpes simplex virus 2) infection    Hypertension    Kidney stone    Sleep disorder, shift work      Past Surgical History:  Procedure Laterality Date   BIOPSY  06/08/2020   Procedure: BIOPSY;  Surgeon: Teressa Toribio SQUIBB, MD;  Location: [facility];  Service: Endoscopy;;   ESOPHAGOGASTRODUODENOSCOPY N/A 03/04/2024   Procedure: EGD (ESOPHAGOGASTRODUODENOSCOPY);  Surgeon: Wilhelmenia Aloha Raddle., MD;  Location: [facility];  Service: Gastroenterology;  Laterality: N/A;   ESOPHAGOGASTRODUODENOSCOPY (EGD) WITH PROPOFOL  N/A 06/08/2020   Procedure: ESOPHAGOGASTRODUODENOSCOPY (EGD) WITH PROPOFOL ;  Surgeon: Teressa Toribio SQUIBB, MD;  Location: [facility];  Service: Endoscopy;  Laterality: N/A;   ESOPHAGOGASTRODUODENOSCOPY (EGD) WITH PROPOFOL  N/A 08/30/2021   Procedure: ESOPHAGOGASTRODUODENOSCOPY (EGD) WITH PROPOFOL ;  Surgeon: Teressa Toribio SQUIBB, MD;  [facility]: [facility];  Service: Endoscopy;  Laterality: N/A;   EUS N/A 06/08/2020   Procedure: UPPER ENDOSCOPIC ULTRASOUND (EUS) RADIAL;  Surgeon: Teressa Toribio SQUIBB, MD;  Location: [facility];  Service: Endoscopy;  Laterality: N/A;   EUS N/A 08/30/2021   Procedure: UPPER ENDOSCOPIC ULTRASOUND (EUS) RADIAL;  Surgeon: Teressa Toribio SQUIBB, MD;  Location: [facility];  Service: Endoscopy;  Laterality: N/A;   EUS N/A 03/04/2024   Procedure: ULTRASOUND, UPPER GI TRACT, ENDOSCOPIC;  Surgeon: Wilhelmenia Aloha Raddle., MD;  Location: [facility];  Service: Gastroenterology;  Laterality: N/A;   VASECTOMY     Family History  Problem Relation Age of Onset   Diabetes Father     Arthritis Father        gout   Heart disease Father    Mental retardation Sister    Cancer Brother    Heart disease Paternal Grandmother    Colon cancer Neg Hx    Esophageal cancer Neg Hx    Pancreatic cancer Neg Hx    Stomach cancer Neg Hx    Social History   Tobacco Use   Smoking status: Some Days    Types: Cigars   Smokeless tobacco: Never  Vaping Use   Vaping status: Never Used  Substance Use Topics   Alcohol use: Yes    Comment: occ   Drug use: No   Current Outpatient Medications  Medication Sig Dispense Refill   cetirizine (ZYRTEC) 10 MG tablet Take 10 mg by mouth daily.     DULoxetine  (CYMBALTA ) 60 MG capsule Take 1 capsule (60 mg total) by mouth daily. 90 capsule 3   fluticasone  (FLONASE ) 50 MCG/ACT nasal spray Place 1 spray into both nostrils daily as needed for allergies or rhinitis.     lisinopril -hydrochlorothiazide  (ZESTORETIC ) 20-12.5 MG tablet Take 1 tablet by mouth daily. 90 tablet 1   pantoprazole  (PROTONIX ) 40 MG tablet Take 1 tablet (40 mg total) by mouth 2 (two) times daily before a meal. 90 tablet 3   sildenafil  (REVATIO ) 20 MG tablet Take 1-2 tablets (20-40 mg total) by mouth daily as needed (prior to intercourse.). 30 tablet 3   valACYclovir  (VALTREX ) 500 MG tablet Take 1 tablet (500 mg total) by mouth daily. 90 tablet 3   No current facility-administered medications for this visit.   Allergies  Allergen Reactions   Prilosec [Omeprazole Magnesium] Rash     Review of Systems: All systems reviewed and negative except where noted in HPI.     Physical Exam:    Wt Readings from Last 3 Encounters:  09/07/24 253 lb (114.8 kg)  08/02/24 264 lb (119.7 kg)  05/27/24 281 lb (127.5 kg)    BP 130/80   Pulse (!) 55   Ht 6' 3 (1.905 m)   Wt 253 lb (114.8 kg)   BMI 31.62 kg/m  Constitutional:  Pleasant, in no acute distress. Psychiatric: Normal mood and affect. Behavior is normal. Neurological: Alert and oriented to person place and time. Skin:  Skin is warm and dry. No rashes noted.   ASSESSMENT AND PLAN;   1) GERD with erosive esophagitis 2) Hiatal hernia Ethan Knox is a 53 y.o. male with a long-standing history of GERD and erosive esophagitis, initially responsive to H2RA therapy but with waning efficacy and clear esophagitis despite ongoing acid suppression therapy.  He has had a good response to pantoprazole  40 mg daily, but is requesting antireflux surgery with goal of improved/resolved symptoms, resolution of esophagitis, and stopping acid suppression medications.  Most recent EGD at the time of his EUS with a small 2 cm hiatal hernia.  I reviewed those images today and discussed those findings with Ethan Knox in the office.  No significant LES laxity noted on retroflexed views, so valve should still be within limits of TIF.  Did discuss the possibility of worsening hiatal hernia which would then change surgical approach from TIF to a concomitant laparoscopic hiatal hernia repair with TIF (cTIF).  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  Plan for repeat EUS in 2 years and if stable, can liberalize surveillance intervals per advanced GI service recommendations   Ethan LULLA Flatter, Ethan Knox, Ethan Knox  09/07/2024, 9:16 AM   Levora Reyes SAUNDERS, MD

## 2024-09-07 NOTE — Patient Instructions (Signed)
 _______________________________________________________  If your blood pressure at your visit was 140/90 or greater, please contact your primary care physician to follow up on this.  _______________________________________________________  If you are age 51 or older, your body mass index should be between 23-30. Your Body mass index is 31.62 kg/m. If this is out of the aforementioned range listed, please consider follow up with your Primary Care Provider.  If you are age 24 or younger, your body mass index should be between 19-25. Your Body mass index is 31.62 kg/m. If this is out of the aformentioned range listed, please consider follow up with your Primary Care Provider.   ________________________________________________________  The [facility] providers would like to encourage you to use MYCHART to communicate with providers for non-urgent requests or questions.  Due to long hold times on the telephone, sending your provider a message by [facility] may be a faster and more efficient way to get a response.  Please allow 48 business hours for a response.  Please remember that this is for non-urgent requests.  _______________________________________________________  [facility] is using a team-based approach to care.  Your team is made up of your doctor and two to three APPS. Our APPS (Nurse Practitioners and Physician Assistants) work with your physician to ensure care continuity for you. They are fully qualified to address your health concerns and develop a treatment plan. They communicate directly with your gastroenterologist to care for you. Seeing the Advanced Practice Practitioners on your physician's team can help you by facilitating care more promptly, often allowing for earlier appointments, access to diagnostic testing, procedures, and other specialty referrals.   We will be in contact with you regarding TIF procedure.  It was a pleasure to see you today!  [name] Cirigliano,  D.O.

## 2024-09-13 ENCOUNTER — Telehealth: Payer: Self-pay

## 2024-09-13 NOTE — Telephone Encounter (Signed)
-----   Message from [facility] sent at 09/07/2024  9:57 AM EDT ----- Regarding: TIF-ins approval Please obtain prior auth for TIF procedure per Dr San.

## 2024-09-13 NOTE — Telephone Encounter (Signed)
 [employer] HIPAA Authorization form & Enrollment form complete - faxed to [employer] along with patient's records. Will await response.   Fax #: [phone]

## 2024-10-04 ENCOUNTER — Ambulatory Visit

## 2024-10-04 VITALS — BP 126/84 | HR 59 | Temp 98.7°F | Ht 75.0 in | Wt 263.0 lb

## 2024-10-04 DIAGNOSIS — G4763 Sleep related bruxism: Secondary | ICD-10-CM

## 2024-10-04 DIAGNOSIS — R0683 Snoring: Secondary | ICD-10-CM

## 2024-10-04 DIAGNOSIS — G4721 Circadian rhythm sleep disorder, delayed sleep phase type: Secondary | ICD-10-CM

## 2024-10-04 DIAGNOSIS — J301 Allergic rhinitis due to pollen: Secondary | ICD-10-CM | POA: Diagnosis not present

## 2024-10-04 NOTE — Progress Notes (Signed)
 Pulmonology Office Visit   Subjective:  Patient ID: Ethan Knox, male    DOB: [birth year]  MRN: [id number]  Referred by: Levora Reyes SAUNDERS, MD  CC:  Chief Complaint  Patient presents with   Consult    snoring    HPI Ethan Knox is a 53 y.o. male with Hypertension, GERD for evaluation of OSA.  Respective notes from provider reviewed as appropriate to gather relevant information for patient care.   Discussed the use of AI scribe software for clinical note transcription with the patient, who gave verbal consent to proceed.  History of Present Illness   Ethan Knox is a 53 year old male who presents for evaluation of obstructive sleep apnea (OSA).  He is being evaluated for obstructive sleep apnea (OSA) after snoring was noted during a gastric procedure. 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  He typically goes to bed between midnight and 2:00 AM, taking about an hour to fall asleep. Usually falls asleep between 1.30-2am. wakes up at 8:30 AM with an alarm.  If allowed, he would sleep until 9:30 or 10:30 AM. He experiences daytime fatigue.  He experiences dry mouth in the morning, which he attributes to medication use. No morning headaches, gasping, or choking during sleep. He has restless legs, particularly when sitting still, but this does not significantly affect his sleep unless he is hot.  His social history includes occasional cigar smoking and social alcohol consumption. He drinks one cup of coffee daily, usually finishing by noon. He has seasonal allergies, for which he takes Zyrtec daily, and occasionally uses Flonase  and saline spray for nasal congestion. He uses a mouth guard due to teeth grinding.          PRIOR TESTS and IMAGING: Echo January 2021: EF 60-65%, mild LVH RV pressures cannot be calculated.     10/04/2024     9:00 AM  Results of the Epworth flowsheet  Sitting and reading 0  Watching TV 2  Sitting, inactive in a public place (e.g. a theatre or a meeting) 2  As a passenger in a car for an hour without a break 3  Lying down to rest in the afternoon when circumstances permit 2  Sitting and talking to someone 0  Sitting quietly after a lunch without alcohol 1  In a car, while stopped for a few minutes in traffic 0  Total score 10    Allergies: Prilosec [omeprazole magnesium]  Current Outpatient Medications:    cetirizine (ZYRTEC) 10 MG tablet, Take 10 mg by mouth daily., Disp: , Rfl:    DULoxetine  (CYMBALTA ) 60 MG capsule, Take 1 capsule (60 mg total) by mouth daily., Disp: 90 capsule, Rfl: 3   fluticasone  (FLONASE ) 50 MCG/ACT nasal spray, Place 1 spray into both nostrils daily as needed for allergies or rhinitis., Disp: , Rfl:    lisinopril -hydrochlorothiazide  (ZESTORETIC ) 20-12.5 MG tablet, Take 1 tablet by mouth daily., Disp: 90 tablet, Rfl: 1   pantoprazole  (PROTONIX ) 40 MG tablet, Take 1 tablet (40 mg total) by mouth 2 (two) times daily before a meal., Disp: 90 tablet, Rfl: 3   sildenafil  (REVATIO ) 20 MG tablet, Take 1-2 tablets (20-40 mg total) by mouth daily as needed (prior to intercourse.)., Disp: 30 tablet, Rfl: 3   valACYclovir  (VALTREX ) 500 MG tablet, Take 1 tablet (500 mg total) by mouth  daily., Disp: 90 tablet, Rfl: 3 Past Medical History:  Diagnosis Date   Depression    HSV-2 (herpes simplex virus 2) infection    Hypertension    Kidney stone    Sleep disorder, shift work    Past Surgical History:  Procedure Laterality Date   BIOPSY  06/08/2020   Procedure: BIOPSY;  Surgeon: Teressa Toribio SQUIBB, MD;  Location: [facility];  Service: Endoscopy;;   ESOPHAGOGASTRODUODENOSCOPY N/A 03/04/2024   Procedure: EGD (ESOPHAGOGASTRODUODENOSCOPY);  Surgeon: Wilhelmenia Aloha Raddle., MD;  Location: [facility];  Service: Gastroenterology;  Laterality: N/A;   ESOPHAGOGASTRODUODENOSCOPY (EGD)  WITH PROPOFOL  N/A 06/08/2020   Procedure: ESOPHAGOGASTRODUODENOSCOPY (EGD) WITH PROPOFOL ;  Surgeon: Teressa Toribio SQUIBB, MD;  Location: [facility];  Service: Endoscopy;  Laterality: N/A;   ESOPHAGOGASTRODUODENOSCOPY (EGD) WITH PROPOFOL  N/A 08/30/2021   Procedure: ESOPHAGOGASTRODUODENOSCOPY (EGD) WITH PROPOFOL ;  Surgeon: Teressa Toribio SQUIBB, MD;  Location: [facility];  Service: Endoscopy;  Laterality: N/A;   EUS N/A 06/08/2020   Procedure: UPPER ENDOSCOPIC ULTRASOUND (EUS) RADIAL;  Surgeon: Teressa Toribio SQUIBB, MD;  Location: [facility];  Service: Endoscopy;  Laterality: N/A;   EUS N/A 08/30/2021   Procedure: UPPER ENDOSCOPIC ULTRASOUND (EUS) RADIAL;  Surgeon: Teressa Toribio SQUIBB, MD;  Location: [facility];  Service: Endoscopy;  Laterality: N/A;   EUS N/A 03/04/2024   Procedure: ULTRASOUND, UPPER GI TRACT, ENDOSCOPIC;  Surgeon: Wilhelmenia Aloha Raddle., MD;  Location: [facility];  Service: Gastroenterology;  Laterality: N/A;   VASECTOMY     Family History  Problem Relation Age of Onset   Diabetes Father    Arthritis Father        gout   Heart disease Father    Mental retardation Sister    Cancer Brother    Heart disease Paternal Grandmother    Colon cancer Neg Hx    Esophageal cancer Neg Hx    Pancreatic cancer Neg Hx    Stomach cancer Neg Hx    Social History   Socioeconomic History   Marital status: Married    Spouse name: Bernice Seton   Number of children: 1   Years of education: 14   Highest education level: Associate degree: academic program  Occupational History   Occupation: Emergency Planning/management Officer  Tobacco Use   Smoking status: Some Days    Types: Cigars   Smokeless tobacco: Never  Vaping Use   Vaping status: Never Used  Substance and Sexual Activity   Alcohol use: Yes    Comment: occ   Drug use: No   Sexual activity: Yes    Comment: vasectomy  Other Topics Concern   Not on file  Social History Narrative   Lives with his wife.  His 38 year old son lives in [place] .    Social Drivers of Corporate Investment Banker Strain: Low Risk  (08/01/2024)   Overall Financial Resource Strain (CARDIA)    Difficulty of Paying Living Expenses: Not hard at all  Food Insecurity: No Food Insecurity (08/01/2024)   Hunger Vital Sign    Worried About Running Out of Food in the Last Year: Never true    Ran Out of Food in the Last Year: Never true  Transportation Needs: No Transportation Needs (08/01/2024)   PRAPARE - Administrator, Civil Service (Medical): No    Lack of Transportation (Non-Medical): No  Physical Activity: Insufficiently Active (08/01/2024)   Exercise Vital Sign    Days of Exercise per Week: 6 days    Minutes of Exercise per Session: 20  min  Stress: Stress Concern Present (08/01/2024)   [employer] of Occupational Health - Occupational Stress Questionnaire    Feeling of Stress: To some extent  Social Connections: Moderately Integrated (08/01/2024)   Social Connection and Isolation Panel    Frequency of Communication with Friends and Family: More than three times a week    Frequency of Social Gatherings with Friends and Family: Never    Attends Religious Services: Never    Database Administrator or Organizations: Yes    Attends Banker Meetings: Never    Marital Status: Married  Catering Manager Violence: Unknown (02/15/2022)   Received from [facility]   HITS    Physically Hurt: Not on file    Insult or Talk Down To: Not on file    Threaten Physical Harm: Not on file    Scream or Curse: Not on file       Objective:  BP 126/84   Pulse (!) 59   Temp 98.7 F (37.1 C) (Oral)   Ht 6' 3 (1.905 m)   Wt 263 lb (119.3 kg)   SpO2 96% Comment: room air  BMI 32.87 kg/m  BMI Readings from Last 3 Encounters:  10/04/24 32.87 kg/m  09/07/24 31.62 kg/m  08/02/24 33.00 kg/m    Physical Exam: Physical Exam   ENT: Normal mucosa. No hypertrophy of inferior turbinates. Tonsils are normal sized. Modified Mallampati score  is 4. Throat normal. PULMONARY: Lungs clear to auscultation bilaterally, no adventitious breath sounds. CARDIOVASCULAR: Regular rate and rhythm, S1 S2 normal, no murmurs. ABDOMEN: Abdomen soft, nontender. Bowel sounds are normal. EXTREMITIES: No peripheral edema noted.       Diagnostic Review:  Last metabolic panel Lab Results  Component Value Date   GLUCOSE 92 08/02/2024   NA 136 08/02/2024   K 3.7 08/02/2024   CL 101 08/02/2024   CO2 27 08/02/2024   BUN 22 08/02/2024   CREATININE 1.12 08/02/2024   GFR 76.18 08/02/2024   CALCIUM 9.8 08/02/2024   PROT 7.6 08/02/2024   ALBUMIN 4.8 08/02/2024   LABGLOB 2.7 08/23/2020   AGRATIO 1.7 08/23/2020   BILITOT 0.7 08/02/2024   ALKPHOS 53 08/02/2024   AST 25 08/02/2024   ALT 36 08/02/2024   ANIONGAP 11 03/30/2021         Assessment & Plan:   Assessment & Plan Snoring  Orders:   Home sleep test; Future  Delayed sleep phase syndrome     Allergic rhinitis due to pollen, unspecified seasonality     Bruxism, sleep-related        Assessment and Plan I discussed with the patient the pathophysiology of obstructive sleep apnea, its association with weight, and its negative effects on hypertension, diabetes, mental health, A-fib, stroke if left untreated.  I briefly discussed the treatment options for obstructive sleep apnea     Suspected obstructive sleep apnea Symptoms suggestive of obstructive sleep apnea. Severity pending home sleep study results. CPAP discussed as primary treatment, with travel CPAP and dental device as alternatives. CPAP preferred for travel convenience and positive feedback. - Ordered home sleep study to confirm diagnosis and assess severity. - Discussed CPAP options, including standard and travel models, and dental device. He is ok with CPAP is +ve for OSA.  - Advised saline spray and Flonase  for nasal congestion with CPAP use.  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 No immediate health concerns. Social implications discussed. - Offered support for lifestyle  changes if he desires to adjust sleep schedule.  Allergic rhinitis due to pollen Symptoms controlled with daily Zyrtec. Flonase  used occasionally. No glaucoma history. - Continue daily Zyrtec. - Use Flonase  as needed for congestion. - saline spray prn.     SABRA  He  was counselled about not driving while drowsy which is common side effect of sleep related disorders.   Return for 2 months after sleep study.   I personally spent a total of 30 minutes in the care of the patient today including preparing to see the patient, getting/reviewing separately obtained history, performing a medically appropriate exam/evaluation, counseling and educating, placing orders, documenting clinical information in the EHR, independently interpreting results, and communicating results.   Jovana Rembold, MD

## 2024-10-04 NOTE — Patient Instructions (Signed)
  VISIT SUMMARY:  Today, you were evaluated for obstructive sleep apnea (OSA) after snoring was noted during a recent gastric procedure. We discussed your sleep patterns, daytime fatigue, and other symptoms. Additionally, we reviewed your history of seasonal allergies and your current medications.  YOUR PLAN:  -SUSPECTED OBSTRUCTIVE SLEEP APNEA: Obstructive sleep apnea is a condition where your airway becomes blocked during sleep, causing breathing pauses. We have ordered a home sleep study to confirm the diagnosis and determine its severity. Depending on the results, we may recommend using a CPAP machine, which helps keep your airway open. We also discussed travel CPAP options and a dental device as alternatives. For nasal congestion, you can use saline spray and Flonase .  -DELAYED SLEEP PHASE CIRCADIAN RHYTHM SLEEP DISORDER: This condition means your natural sleep-wake cycle is delayed, causing you to go to bed and wake up later than usual. While it is not an immediate health concern, it can have social implications. We can offer support if you wish to adjust your sleep schedule.  -ALLERGIC RHINITIS DUE TO POLLEN: Allergic rhinitis is an allergic reaction that causes sneezing, congestion, and a runny nose. Your symptoms are currently controlled with daily Zyrtec. You can continue using Flonase  and saline spray prn.                        Contains text generated by [employer]bridge.                                 Contains text generated by Abridge.

## 2024-10-05 NOTE — Telephone Encounter (Signed)
 Signed appeal letter has been mailed back to TIF patient access case team.

## 2024-10-05 NOTE — Telephone Encounter (Signed)
 Received this email on 11/20 - appeal letter has been printed, awaiting Dr. Rennis signature. Will send signed appeal letter back via email.   Case ID: [id number]  Hello,  Please be advised that the Pre-Determination request for the TIF procedure with EsophyX has been denied. Details are as follows:  Denied CPT Code: 56789 Denied Case #: [id number] Denial Date:09/22/2024 Denial Rationale: Not medically necessary Medical Policy: [id number]  Next Steps:  Call P2P line with 3 dates and 3 times [phone]. If you would like assistance with coordinating the P2P, please provide the three (3) dates and times that Dr. San is available and a direct contact number.  If the physician is unable to complete the P2P, we will move forward with the formal appeal submission.  Please review/edit the attached Appeal letter.  Please return the signed copy on your practice/hospital letterhead at your earliest convenience. Regards, Case Team, [facility] Patient Access Program [address] [place],  [address] Phone: [phone]  Fax: [phone]  Email: [email or web address]

## 2024-10-18 ENCOUNTER — Encounter

## 2024-10-28 ENCOUNTER — Telehealth: Payer: Self-pay

## 2024-10-28 DIAGNOSIS — G4733 Obstructive sleep apnea (adult) (pediatric): Secondary | ICD-10-CM

## 2024-10-28 DIAGNOSIS — G4730 Sleep apnea, unspecified: Secondary | ICD-10-CM | POA: Diagnosis not present

## 2024-10-28 NOTE — Telephone Encounter (Signed)
 Date of Study: 10/16/24  Interpretation: Mild OSA with AHI of 7.9, O2 desaturation 0 min. O2 nadir 89%.   Plan: Initiate auto CPAP at 6-15 cm H2O and provide supplies.  Side sleep is important if possible.   Sammi Fredericks, MD.

## 2024-10-29 NOTE — Telephone Encounter (Signed)
 Called and spoke to pt. Advised of sleep study results per Dr. Theodoro. Pt verbalized understanding and would like to proceed with CPAP therapy. CPAP order placed, NFN.

## 2024-10-29 NOTE — Addendum Note (Signed)
 Addended byBETHA JESSICA BOUCHARD S on: 10/29/2024 11:55 AM   Modules accepted: Orders

## 2024-12-17 ENCOUNTER — Ambulatory Visit

## 2024-12-17 VITALS — BP 106/60 | HR 53 | Temp 97.8°F | Ht 75.0 in | Wt 260.0 lb

## 2024-12-17 DIAGNOSIS — I10 Essential (primary) hypertension: Secondary | ICD-10-CM

## 2024-12-17 DIAGNOSIS — G4721 Circadian rhythm sleep disorder, delayed sleep phase type: Secondary | ICD-10-CM

## 2024-12-17 DIAGNOSIS — J301 Allergic rhinitis due to pollen: Secondary | ICD-10-CM

## 2024-12-17 DIAGNOSIS — G4763 Sleep related bruxism: Secondary | ICD-10-CM

## 2024-12-17 DIAGNOSIS — G4733 Obstructive sleep apnea (adult) (pediatric): Secondary | ICD-10-CM

## 2024-12-17 NOTE — Progress Notes (Signed)
 "  Pulmonology Office Visit   Subjective:  Patient ID: Ethan Knox, male    DOB: Feb 28, 1971  MRN: [id number]  Referred by: Levora Reyes SAUNDERS, MD  CC:  Chief Complaint  Patient presents with   Follow-up    Review sleep study from 10/18/2024    HPI Ethan Knox is a 54 y.o. male with Hypertension, GERD for evaluation of OSA. Emergency planning/management officer. Non smoker.   10/04/2024: HST ordered which showed mild OSA.  Respective notes from provider reviewed as appropriate to gather relevant information for patient care.   Discussed the use of AI scribe software for clinical note transcription with the patient, who gave verbal consent to proceed.  History of Present Illness   Ethan Knox is a 54 year old male with mild sleep apnea who presents for evaluation and management of his condition.  He has an apnea-hypopnea index (AHI) of 7.9 events per hour, and his AHI is higher when he sleeps on his back. He attempts to sleep on his side to reduce snoring and apnea events, noting that his AHI is 3.6 when on his right side. He has been using a mouthguard with a top piece only.  He experiences fatigue and sleepiness only when his sleep is disrupted. His work schedule is variable, sometimes requiring night shifts or early mornings, which can affect his sleep pattern.  He is actively trying to lose weight primarily through dietary changes, as his job involves a lot of sitting in a car, which limits his physical activity. 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      OSA history: Dec 2025 Mild OSA> CPAP ordered. Uses mouth guard.   PRIOR TESTS and IMAGING: Echo January 2021: EF 60-65%, mild LVH RV pressures cannot be calculated. HST 10/16/2024: AHI 7.9, O2 nadir 89%.  Supine dominant.      10/04/2024    9:00 AM  Results of the Epworth flowsheet  Sitting and reading 0  Watching TV 2  Sitting, inactive in a public  place (e.g. a theatre or a meeting) 2  As a passenger in a car for an hour without a break 3  Lying down to rest in the afternoon when circumstances permit 2  Sitting and talking to someone 0  Sitting quietly after a lunch without alcohol 1  In a car, while stopped for a few minutes in traffic 0  Total score 10    Allergies: Prilosec [omeprazole magnesium]  Current Outpatient Medications:    cetirizine (ZYRTEC) 10 MG tablet, Take 10 mg by mouth daily., Disp: , Rfl:    DULoxetine  (CYMBALTA ) 60 MG capsule, Take 1 capsule (60 mg total) by mouth daily., Disp: 90 capsule, Rfl: 3   fluticasone  (FLONASE ) 50 MCG/ACT nasal spray, Place 1 spray into both nostrils daily as needed for allergies or rhinitis., Disp: , Rfl:    lisinopril -hydrochlorothiazide  (ZESTORETIC ) 20-12.5 MG tablet, Take 1 tablet by mouth daily., Disp: 90 tablet, Rfl: 1   pantoprazole  (PROTONIX ) 40 MG tablet, Take 1 tablet (40 mg total) by mouth 2 (two) times daily before a meal., Disp: 90 tablet, Rfl: 3   sildenafil  (REVATIO ) 20 MG tablet, Take 1-2 tablets (20-40 mg total) by mouth daily as needed (prior to intercourse.)., Disp: 30 tablet, Rfl: 3   valACYclovir  (VALTREX ) 500 MG tablet, Take 1 tablet (500 mg total) by mouth daily., Disp: 90 tablet, Rfl: 3 Past Medical History:  Diagnosis  Date   Depression    HSV-2 (herpes simplex virus 2) infection    Hypertension    Kidney stone    Sleep disorder, shift work    Past Surgical History:  Procedure Laterality Date   BIOPSY  06/08/2020   Procedure: BIOPSY;  Surgeon: Teressa Toribio SQUIBB, MD;  Location: [facility];  Service: Endoscopy;;   ESOPHAGOGASTRODUODENOSCOPY N/A 03/04/2024   Procedure: EGD (ESOPHAGOGASTRODUODENOSCOPY);  Surgeon: Wilhelmenia Aloha Raddle., MD;  Location: [facility];  Service: Gastroenterology;  Laterality: N/A;   ESOPHAGOGASTRODUODENOSCOPY (EGD) WITH PROPOFOL  N/A 06/08/2020   Procedure: ESOPHAGOGASTRODUODENOSCOPY (EGD) WITH PROPOFOL ;  Surgeon: Teressa Toribio SQUIBB,  MD;  Location: [facility];  Service: Endoscopy;  Laterality: N/A;   ESOPHAGOGASTRODUODENOSCOPY (EGD) WITH PROPOFOL  N/A 08/30/2021   Procedure: ESOPHAGOGASTRODUODENOSCOPY (EGD) WITH PROPOFOL ;  Surgeon: Teressa Toribio SQUIBB, MD;  Location: [facility];  Service: Endoscopy;  Laterality: N/A;   EUS N/A 06/08/2020   Procedure: UPPER ENDOSCOPIC ULTRASOUND (EUS) RADIAL;  Surgeon: Teressa Toribio SQUIBB, MD;  Location: [facility];  Service: Endoscopy;  Laterality: N/A;   EUS N/A 08/30/2021   Procedure: UPPER ENDOSCOPIC ULTRASOUND (EUS) RADIAL;  Surgeon: Teressa Toribio SQUIBB, MD;  Location: [facility];  Service: Endoscopy;  Laterality: N/A;   EUS N/A 03/04/2024   Procedure: ULTRASOUND, UPPER GI TRACT, ENDOSCOPIC;  Surgeon: Wilhelmenia Aloha Raddle., MD;  Location: [facility];  Service: Gastroenterology;  Laterality: N/A;   VASECTOMY     Family History  Problem Relation Age of Onset   Diabetes Father    Arthritis Father        gout   Heart disease Father    Mental retardation Sister    Cancer Brother    Heart disease Paternal Grandmother    Colon cancer Neg Hx    Esophageal cancer Neg Hx    Pancreatic cancer Neg Hx    Stomach cancer Neg Hx    Social History   Socioeconomic History   Marital status: Married    Spouse name: Bernice Seton   Number of children: 1   Years of education: 14   Highest education level: Associate degree: academic program  Occupational History   Occupation: Emergency Planning/management Officer  Tobacco Use   Smoking status: Some Days    Types: Cigars   Smokeless tobacco: Never  Vaping Use   Vaping status: Never Used  Substance and Sexual Activity   Alcohol use: Yes    Comment: occ   Drug use: No   Sexual activity: Yes    Comment: vasectomy  Other Topics Concern   Not on file  Social History Narrative   Lives with his wife.  His 71 year old son lives in [place] .   Social Drivers of Health   Tobacco Use: High Risk (12/17/2024)   Patient History    Smoking Tobacco Use: Some Days     Smokeless Tobacco Use: Never    Passive Exposure: Not on file  Financial Resource Strain: Low Risk (08/01/2024)   Overall Financial Resource Strain (CARDIA)    Difficulty of Paying Living Expenses: Not hard at all  Food Insecurity: No Food Insecurity (08/01/2024)   Epic    Worried About Programme Researcher, Broadcasting/film/video in the Last Year: Never true    Ran Out of Food in the Last Year: Never true  Transportation Needs: No Transportation Needs (08/01/2024)   Epic    Lack of Transportation (Medical): No    Lack of Transportation (Non-Medical): No  Physical Activity: Insufficiently Active (08/01/2024)   Exercise Vital Sign  Days of Exercise per Week: 6 days    Minutes of Exercise per Session: 20 min  Stress: Stress Concern Present (08/01/2024)   [employer] of Occupational Health - Occupational Stress Questionnaire    Feeling of Stress: To some extent  Social Connections: Moderately Integrated (08/01/2024)   Social Connection and Isolation Panel    Frequency of Communication with Friends and Family: More than three times a week    Frequency of Social Gatherings with Friends and Family: Never    Attends Religious Services: Never    Database Administrator or Organizations: Yes    Attends Banker Meetings: Never    Marital Status: Married  Catering Manager Violence: Unknown (02/15/2022)   Received from [facility]   HITS    Physically Hurt: Not on file    Insult or Talk Down To: Not on file    Threaten Physical Harm: Not on file    Scream or Curse: Not on file  Depression (PHQ2-9): Medium Risk (08/02/2024)   Depression (PHQ2-9)    PHQ-2 Score: 6  Alcohol Screen: Low Risk (08/01/2024)   Alcohol Screen    Last Alcohol Screening Score (AUDIT): 1  Housing: Low Risk (08/01/2024)   Epic    Unable to Pay for Housing in the Last Year: No    Number of Times Moved in the Last Year: 0    Homeless in the Last Year: No  Utilities: Not on file  Health Literacy: Not on file        Objective:  BP 106/60   Pulse (!) 53   Temp 97.8 F (36.6 C) (Oral)   Ht 6' 3 (1.905 m)   Wt 260 lb (117.9 kg)   SpO2 97% Comment: room air  BMI 32.50 kg/m  BMI Readings from Last 3 Encounters:  12/17/24 32.50 kg/m  10/04/24 32.87 kg/m  09/07/24 31.62 kg/m    Physical Exam: Physical Exam   ENT: Normal mucosa.  PULMONARY: Lungs clear to auscultation bilaterally, no adventitious breath sounds. CARDIOVASCULAR: Regular rate and rhythm, S1 S2 normal, no murmurs. ABDOMEN: Abdomen soft, nontender. Bowel sounds are normal. EXTREMITIES: No peripheral edema noted.       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 No immediate health concerns. Social implications has been discussed. - Offered support for lifestyle changes if he desires to adjust sleep schedule.    Allergic rhinitis due to pollen, unspecified seasonality Symptoms controlled with daily Zyrtec. Flonase  used occasionally. No glaucoma history. - Continue daily Zyrtec. - Use Flonase  as needed for congestion. - saline spray prn.     Bruxism, sleep-related     Primary hypertension On lisinopril -hydrochlorothiazide .        Assessment and Plan I discussed with the patient  the pathophysiology of obstructive sleep apnea, its association with weight, and its negative effects on hypertension, diabetes,  mental health, A-fib, stroke if left untreated.  I briefly discussed the treatment options for obstructive sleep apnea     Obstructive sleep apnea Very mild obstructive sleep apnea with AHI 7.9, increasing to 20 supine. Symptoms include snoring and daytime fatigue. Hypertension present. CPAP recommended; mandibular advancement device as alternative. Weight loss may cure due to mild severity. - Continue CPAP once obtained. - Encouraged side sleeping to reduce apnea severity. - Consider weight loss as potential cure. - Provided CPAP usage instructions, including gradual acclimatization and insurance compliance.      He  was counselled about not driving while drowsy which is common side effect of sleep related disorders.   Return for 1 month after CPAP setup.   I personally spent a total of 15 minutes in the care of the patient today including preparing to see the patient, getting/reviewing separately obtained history, performing a medically appropriate exam/evaluation, counseling and educating, placing orders, documenting clinical information in the EHR, independently interpreting results, and communicating results.   Sammi Fredericks, MD "

## 2024-12-17 NOTE — Patient Instructions (Signed)
 We did discuss about need for compliance to meet insurance requirement.  The patient has to use CPAP for at least 5 out of 7 days in a week for a consecutive month within the first 3 months after getting CPAP.   VISIT SUMMARY: Today, we discussed your mild sleep apnea and ways to manage it. Your apnea-hypopnea index (AHI) is 7.9 events per hour, which increases when you sleep on your back. You have been trying to sleep on your side and using a mouthguard. We also talked about your efforts to lose weight and how your work schedule affects your sleep.  YOUR PLAN: -OBSTRUCTIVE SLEEP APNEA: Obstructive sleep apnea is a condition where your breathing stops and starts during sleep due to blocked airways. Your AHI is 7.9, which is considered very mild, but it increases significantly when you sleep on your back. We recommend continuing with CPAP therapy once you obtain the device. CPAP helps keep your airways open during sleep. We also encourage you to sleep on your side to reduce the severity of apnea events. Additionally, losing weight may potentially cure your sleep apnea due to its mild severity. We provided instructions on how to use the CPAP machine, including gradually getting used to it and ensuring compliance with insurance requirements.  INSTRUCTIONS: Please continue with your efforts to lose weight and try to maintain a consistent sleep schedule despite your variable work hours. Follow the CPAP usage instructions provided and aim to sleep on your side as much as possible. Schedule a follow-up appointment to monitor your progress and adjust the treatment plan if necessary.    Contains text generated by [employer]bridge.

## 2025-01-31 ENCOUNTER — Ambulatory Visit: Admitting: Family Medicine
